# Patient Record
Sex: Female | Born: 1965 | Race: White | Hispanic: No | Smoking: Former smoker
Health system: Southern US, Community
[De-identification: ages and names within clinical notes are randomized; demographics above are authoritative.]

## PROBLEM LIST (undated history)

## (undated) DIAGNOSIS — M1712 Unilateral primary osteoarthritis, left knee: Secondary | ICD-10-CM

## (undated) DIAGNOSIS — I10 Essential (primary) hypertension: Secondary | ICD-10-CM

## (undated) DIAGNOSIS — N3281 Overactive bladder: Secondary | ICD-10-CM

## (undated) DIAGNOSIS — R300 Dysuria: Secondary | ICD-10-CM

## (undated) DIAGNOSIS — R7303 Prediabetes: Secondary | ICD-10-CM

## (undated) DIAGNOSIS — IMO0001 Reserved for inherently not codable concepts without codable children: Secondary | ICD-10-CM

## (undated) DIAGNOSIS — G709 Myoneural disorder, unspecified: Secondary | ICD-10-CM

## (undated) DIAGNOSIS — N393 Stress incontinence (female) (male): Secondary | ICD-10-CM

## (undated) DIAGNOSIS — E669 Obesity, unspecified: Secondary | ICD-10-CM

## (undated) DIAGNOSIS — Z8489 Family history of other specified conditions: Secondary | ICD-10-CM

## (undated) DIAGNOSIS — N3941 Urge incontinence: Secondary | ICD-10-CM

## (undated) DIAGNOSIS — M23204 Derangement of unspecified medial meniscus due to old tear or injury, left knee: Secondary | ICD-10-CM

## (undated) HISTORY — DX: Overactive bladder: N32.81

## (undated) HISTORY — DX: Stress incontinence (female) (male): N39.3

## (undated) HISTORY — DX: Reserved for inherently not codable concepts without codable children: IMO0001

## (undated) HISTORY — DX: Dysuria: R30.0

## (undated) HISTORY — DX: Urge incontinence: N39.41

## (undated) HISTORY — PX: ABDOMINAL HYSTERECTOMY: SHX81

## (undated) HISTORY — DX: Essential (primary) hypertension: I10

## (undated) HISTORY — DX: Obesity, unspecified: E66.9

---

## 2003-12-12 ENCOUNTER — Other Ambulatory Visit: Payer: Self-pay

## 2004-03-07 ENCOUNTER — Other Ambulatory Visit: Payer: Self-pay

## 2004-08-27 ENCOUNTER — Other Ambulatory Visit: Payer: Self-pay

## 2004-08-27 ENCOUNTER — Emergency Department: Payer: Self-pay | Admitting: Emergency Medicine

## 2004-09-03 ENCOUNTER — Emergency Department: Payer: Self-pay | Admitting: Emergency Medicine

## 2004-09-04 ENCOUNTER — Ambulatory Visit: Payer: Self-pay | Admitting: Emergency Medicine

## 2004-10-02 ENCOUNTER — Ambulatory Visit: Payer: Self-pay | Admitting: Internal Medicine

## 2004-11-20 ENCOUNTER — Ambulatory Visit: Payer: Self-pay | Admitting: Internal Medicine

## 2005-04-04 ENCOUNTER — Emergency Department: Payer: Self-pay | Admitting: Unknown Physician Specialty

## 2005-05-16 ENCOUNTER — Ambulatory Visit: Payer: Self-pay | Admitting: Specialist

## 2005-10-31 ENCOUNTER — Other Ambulatory Visit: Payer: Self-pay

## 2005-10-31 ENCOUNTER — Emergency Department: Payer: Self-pay | Admitting: Emergency Medicine

## 2005-12-24 ENCOUNTER — Ambulatory Visit: Payer: Self-pay | Admitting: Internal Medicine

## 2006-04-29 ENCOUNTER — Ambulatory Visit: Payer: Self-pay | Admitting: Internal Medicine

## 2007-01-11 ENCOUNTER — Emergency Department: Payer: Self-pay | Admitting: Emergency Medicine

## 2007-01-11 ENCOUNTER — Other Ambulatory Visit: Payer: Self-pay

## 2007-05-05 ENCOUNTER — Ambulatory Visit: Payer: Self-pay | Admitting: Internal Medicine

## 2008-04-29 ENCOUNTER — Ambulatory Visit: Payer: Self-pay | Admitting: Internal Medicine

## 2009-08-29 ENCOUNTER — Emergency Department: Payer: Self-pay | Admitting: Unknown Physician Specialty

## 2010-02-15 ENCOUNTER — Emergency Department: Payer: Self-pay | Admitting: Emergency Medicine

## 2010-07-26 ENCOUNTER — Emergency Department: Payer: Self-pay

## 2010-12-24 ENCOUNTER — Emergency Department: Payer: Self-pay | Admitting: Emergency Medicine

## 2011-07-27 ENCOUNTER — Emergency Department: Payer: Self-pay | Admitting: Emergency Medicine

## 2013-04-08 ENCOUNTER — Emergency Department: Payer: Self-pay | Admitting: Emergency Medicine

## 2013-04-08 LAB — URINALYSIS, COMPLETE
Bacteria: NONE SEEN
Glucose,UR: NEGATIVE mg/dL (ref 0–75)
Leukocyte Esterase: NEGATIVE
Nitrite: NEGATIVE
Squamous Epithelial: <1

## 2013-04-08 LAB — CBC
HGB: 13.9 g/dL (ref 12.0–16.0)
MCH: 30.5 pg (ref 26.0–34.0)
MCHC: 34.6 g/dL (ref 32.0–36.0)
MCV: 88 fL (ref 80–100)
Platelet: 198 10*3/uL (ref 150–440)
RBC: 4.55 10*6/uL (ref 3.80–5.20)
WBC: 8.1 10*3/uL (ref 3.6–11.0)

## 2013-04-08 LAB — BASIC METABOLIC PANEL
BUN: 16 mg/dL (ref 7–18)
Calcium, Total: 9.5 mg/dL (ref 8.5–10.1)
Chloride: 103 mmol/L (ref 98–107)
Co2: 26 mmol/L (ref 21–32)
Creatinine: 0.96 mg/dL (ref 0.60–1.30)
EGFR (African American): >60
Glucose: 124 mg/dL — ABNORMAL HIGH (ref 65–99)
Osmolality: 276 (ref 275–301)
Sodium: 137 mmol/L (ref 136–145)

## 2013-07-02 ENCOUNTER — Emergency Department: Payer: Self-pay | Admitting: Emergency Medicine

## 2013-11-26 ENCOUNTER — Emergency Department: Payer: Self-pay | Admitting: Emergency Medicine

## 2014-02-11 ENCOUNTER — Emergency Department: Payer: Self-pay | Admitting: Emergency Medicine

## 2014-11-22 ENCOUNTER — Ambulatory Visit: Payer: Self-pay | Admitting: Physical Therapy

## 2014-11-22 ENCOUNTER — Other Ambulatory Visit: Payer: Self-pay | Admitting: Family Medicine

## 2014-11-22 DIAGNOSIS — Z1231 Encounter for screening mammogram for malignant neoplasm of breast: Secondary | ICD-10-CM

## 2014-12-06 ENCOUNTER — Ambulatory Visit
Admission: RE | Admit: 2014-12-06 | Discharge: 2014-12-06 | Disposition: A | Discharge status: Home / Self Care | Payer: No Typology Code available for payment source | Source: Ambulatory Visit | Attending: Family Medicine | Admitting: Family Medicine

## 2014-12-06 DIAGNOSIS — Z1231 Encounter for screening mammogram for malignant neoplasm of breast: Secondary | ICD-10-CM | POA: Insufficient documentation

## 2014-12-08 ENCOUNTER — Ambulatory Visit: Payer: Self-pay | Admitting: Urology

## 2014-12-22 ENCOUNTER — Telehealth: Payer: Self-pay | Admitting: Urology

## 2014-12-22 NOTE — Telephone Encounter (Signed)
[place], the patient's fiance called to see if she can be seen before her appointment on June 30th. He said her symptoms are becoming much worse and she asked him to call and see if her appt can be moved up. According to [place], the pt was on a treadmill yesterday and urinated on herself without control. He said at any minute she could urinate herself. When she has to go to the bathroom, she has to go at that very moment and doesn't make it to the restroom in most instances. She would like to be seen asap so this can be resolved if at all possible. She's wondering if she can be worked in at some point. Please give them a phone call. Sandy[facility] ph# [phone] Thank you.

## 2014-12-22 NOTE — Telephone Encounter (Signed)
Per Larene Beach we can not work pt in before 01/05/15. Cw,lpn

## 2015-01-05 ENCOUNTER — Ambulatory Visit (INDEPENDENT_AMBULATORY_CARE_PROVIDER_SITE_OTHER): Payer: No Typology Code available for payment source | Admitting: Urology

## 2015-01-05 ENCOUNTER — Encounter: Payer: Self-pay | Admitting: Urology

## 2015-01-05 VITALS — BP 129/83 | HR 67 | Ht 66.0 in | Wt 246.1 lb

## 2015-01-05 DIAGNOSIS — R339 Retention of urine, unspecified: Secondary | ICD-10-CM | POA: Diagnosis not present

## 2015-01-05 MED ORDER — MIRABEGRON ER 50 MG PO TB24: 50.0000 mg | ORAL_TABLET | Freq: Every day | ORAL | Status: DC

## 2015-01-05 NOTE — Patient Instructions (Signed)
Perform clean intermittent catheterization twice daily We are better 50 mg once daily Follow-up in clinic in 2 months.

## 2015-01-05 NOTE — Progress Notes (Signed)
Reason for visit: Follow-up for urinary incontinence  49 year old female who has struggled with severe urinary incontinence and urinary frequency over the last 6-12 months. The patient states that she voids once an hour during the day and 6 or 7 times at night. She ultimately in sup going to the bathroom 30 times daily. She feels that she empties her bladder completely. She states that she has a strong stream. She has severe leakage with stress maneuvers including laughing, coughing, sneezing, and exercise. She also has urgency and urge incontinence. She does not have a history of recurrent urinary tract infections. She's not had any gross hematuria.  The patient underwent urodynamics approximately 6 weeks ago. This demonstrated a capacity of 400 mL. The patient had mild bladder prolapse. She did have stress incontinence as well as weak unstable bladder contractions. Most notably, the patient's postvoid residual was greater than 200 with each time she was prompted to empty her bladder.  In the interim the patient has been given Myrbetriq which she did not think was particularly helpful. Currently she is on oxybutynin 5 mg twice a day. She has not noticed any significant improvement with medications.  Filed Vitals:   01/05/15 1624  BP: 129/83  Pulse: 67   the patient is obese in no acute distress Her abdomen is soft, nontender, nondistended, no suprapubic tenderness Her vaginal exam demonstrates grade 2/4 anterior vaginal wall prolapse with urethral hypermobility. There was no demonstrable stress incontinence with Valsalva. There is no evidence of rectal prolapse.  PVR: 260 mL's Patient's urinalysis today demonstrates no evidence of microscopic hematuria or infection.  Impression: The patient has numerous issues, I think the most pressing 1 is her inability to empty her bladder completely. She has an elevated post void residual. She has a relatively small bladder capacity as determined by  urodynamics. She also has unstable bladder contractions albeit weak. Further, she has hyper mobility of her urethra and a mild bladder prolapse.  Recommendations: Based on the above, I have recommended that we initiate clean intermittent catheterization twice daily. This should help her significantly with her urinary frequency and urgency. He will also help with her incontinence. In addition, I have recommended that we start Mrbetriq 50 mg daily. I have provided 2 months of samples for the patient. We have also demonstrated clean intermittent catheterization technique. Both she and her husband were taught the technique, between the 2 of them they will try to get her empty twice daily. We will follow up in 2 months to see how the patient's symptoms have progressed.  Cc: Dr Sharyne Peach, MD

## 2015-01-11 ENCOUNTER — Telehealth: Payer: Self-pay | Admitting: Urology

## 2015-01-11 NOTE — Telephone Encounter (Signed)
Received a fax from [place] in the PT Department that states: "Patient was scheduled for a PT appointment on 5/17 and cancelled appointment.  Patient does not want to reschedule."

## 2015-01-17 ENCOUNTER — Encounter: Payer: Self-pay | Admitting: Emergency Medicine

## 2015-01-17 DIAGNOSIS — Z79899 Other long term (current) drug therapy: Secondary | ICD-10-CM | POA: Diagnosis not present

## 2015-01-17 DIAGNOSIS — N12 Tubulo-interstitial nephritis, not specified as acute or chronic: Secondary | ICD-10-CM | POA: Insufficient documentation

## 2015-01-17 DIAGNOSIS — R300 Dysuria: Secondary | ICD-10-CM | POA: Diagnosis present

## 2015-01-17 DIAGNOSIS — I10 Essential (primary) hypertension: Secondary | ICD-10-CM | POA: Insufficient documentation

## 2015-01-17 LAB — URINALYSIS COMPLETE WITH MICROSCOPIC (ARMC ONLY)
Bacteria, UA: NONE SEEN
Bilirubin Urine: NEGATIVE
Glucose, UA: NEGATIVE mg/dL
Ketones, ur: NEGATIVE mg/dL
NITRITE: NEGATIVE
Protein, ur: 30 mg/dL — AB
SPECIFIC GRAVITY, URINE: 1.012 (ref 1.005–1.030)
pH: 6 (ref 5.0–8.0)

## 2015-01-17 NOTE — ED Notes (Signed)
Pt presents to ER alert and in NAD. Pt reports lower back pain and pain with urination.

## 2015-01-18 ENCOUNTER — Telehealth: Payer: Self-pay

## 2015-01-18 ENCOUNTER — Emergency Department
Admission: EM | Admit: 2015-01-18 | Discharge: 2015-01-18 | Disposition: A | Discharge status: Home / Self Care | Payer: No Typology Code available for payment source | Attending: Emergency Medicine | Admitting: Emergency Medicine

## 2015-01-18 ENCOUNTER — Emergency Department: Payer: No Typology Code available for payment source

## 2015-01-18 DIAGNOSIS — R319 Hematuria, unspecified: Secondary | ICD-10-CM

## 2015-01-18 DIAGNOSIS — N12 Tubulo-interstitial nephritis, not specified as acute or chronic: Secondary | ICD-10-CM

## 2015-01-18 DIAGNOSIS — R109 Unspecified abdominal pain: Secondary | ICD-10-CM

## 2015-01-18 MED ORDER — SULFAMETHOXAZOLE-TRIMETHOPRIM 800-160 MG PO TABS
1.0000 | ORAL_TABLET | Freq: Once | ORAL | Status: AC
  Administered 2015-01-18: 1 via ORAL
  Filled 2015-01-18: qty 1

## 2015-01-18 MED ORDER — MORPHINE SULFATE 2 MG/ML IJ SOLN
2.0000 mg | Freq: Once | INTRAMUSCULAR | Status: AC
  Administered 2015-01-18: 2 mg via INTRAVENOUS
  Filled 2015-01-18: qty 1

## 2015-01-18 MED ORDER — SULFAMETHOXAZOLE-TRIMETHOPRIM 800-160 MG PO TABS: 1.0000 | ORAL_TABLET | Freq: Two times a day (BID) | ORAL | Status: AC

## 2015-01-18 MED ORDER — PHENAZOPYRIDINE HCL 200 MG PO TABS: 200.0000 mg | ORAL_TABLET | Freq: Three times a day (TID) | ORAL | Status: AC | PRN

## 2015-01-18 MED ORDER — ONDANSETRON HCL 4 MG/2ML IJ SOLN
4.0000 mg | Freq: Once | INTRAMUSCULAR | Status: AC
  Administered 2015-01-18: 4 mg via INTRAVENOUS
  Filled 2015-01-18: qty 2

## 2015-01-18 NOTE — ED Provider Notes (Signed)
[facility] Emergency Department Provider Note  ____________________________________________  Time seen: 2:15AM  I have reviewed the triage vital signs and the nursing notes.   HISTORY  Chief Complaint Back Pain and Dysuria      HPI Sherry Hernandez is a 49 y.o. female presents with left flank pain that is sharp in nature currently 8 out of 10 and nonradiating 3 days. Patient also admits to hematuria and dysuria. Of note patient self catheter twice per day. Patient denies any fever no nausea or vomiting     Past Medical History  Diagnosis Date  . Overactive bladder   . Dysuria   . Obesity   . HTN (hypertension)   . Stress incontinence   . Sensory urge incontinence   . Frequency     There are no active problems to display for this patient.   Past Surgical History  Procedure Laterality Date  . Abdominal hysterectomy      Current Outpatient Rx  Name  Route  Sig  Dispense  Refill  . losartan (COZAAR) 50 MG tablet   Oral   Take 50 mg by mouth daily.         . mirabegron ER (MYRBETRIQ) 50 MG TB24 tablet   Oral   Take 1 tablet (50 mg total) by mouth daily.   30 tablet        Allergies Review of patient's allergies indicates no known allergies.  Family History  Problem Relation Age of Onset  . Cancer Father   . Cancer Mother   . Breast cancer Mother 33  . Cancer Sister     Social History History  Substance Use Topics  . Smoking status: Never Smoker   . Smokeless tobacco: Not on file  . Alcohol Use: No    Review of Systems  Constitutional: Negative for fever. Eyes: Negative for visual changes. ENT: Negative for sore throat. Cardiovascular: Negative for chest pain. Respiratory: Negative for shortness of breath. Gastrointestinal: Negative for abdominal pain, vomiting and diarrhea. Genitourinary: Positive for hematuria and dysuria. Musculoskeletal: Positive for back pain. Skin: Negative for rash. Neurological:  Negative for headaches, focal weakness or numbness.   10-point ROS otherwise negative.  ____________________________________________   PHYSICAL EXAM:  VITAL SIGNS: ED Triage Vitals  Enc Vitals Group     BP 01/17/15 2152 136/35 mmHg     Pulse Rate 01/17/15 2152 85     Resp 01/17/15 2152 20     Temp 01/17/15 2152 98.3 F (36.8 C)     Temp Source 01/17/15 2152 Oral     SpO2 01/17/15 2152 93 %     Weight 01/17/15 2152 250 lb (113.399 kg)     Height 01/17/15 2152 5\' 6"  (1.676 m)     Head Cir --      Peak Flow --      Pain Score 01/17/15 2153 10     Pain Loc --      Pain Edu? --      Excl. in [place]? --      Constitutional: Alert and oriented. Well appearing and in no distress. Eyes: Conjunctivae are normal. PERRL. Normal extraocular movements. ENT   Head: Normocephalic and atraumatic.   Nose: No congestion/rhinnorhea.   Mouth/Throat: Mucous membranes are moist.   Neck: No stridor. Cardiovascular: Normal rate, regular rhythm. Normal and symmetric distal pulses are present in all extremities. No murmurs, rubs, or gallops. Respiratory: Normal respiratory effort without tachypnea nor retractions. Breath sounds are clear and equal bilaterally.  No wheezes/rales/rhonchi. Gastrointestinal: Soft and nontender. No distention. There is left CVA tenderness. Genitourinary: deferred Musculoskeletal: Nontender with normal range of motion in all extremities. No joint effusions.  No lower extremity tenderness nor edema. Neurologic:  Normal speech and language. No gross focal neurologic deficits are appreciated. Speech is normal.  Skin:  Skin is warm, dry and intact. No rash noted. Psychiatric: Mood and affect are normal. Speech and behavior are normal. Patient exhibits appropriate insight and judgment.  ____________________________________________    LABS (pertinent positives/negatives)  Labs Reviewed  URINALYSIS COMPLETEWITH MICROSCOPIC ([place]) - Abnormal; Notable for the  following:    Color, Urine YELLOW (*)    APPearance CLEAR (*)    Hgb urine dipstick 3+ (*)    Protein, ur 30 (*)    Leukocytes, UA TRACE (*)    Squamous Epithelial / LPF 0-5 (*)    All other components within normal limits       RADIOLOGY    ____________________________________________    INITIAL IMPRESSION / ASSESSMENT AND PLAN / ED COURSE  Pertinent labs & imaging results that were available during my care of the patient were reviewed by me and considered in my medical decision making (see chart for details).  Given urinalysis results and left CVA tenderness suspect pyelonephritis as etiology for patient's discomfort. As such patient was given Bactrim DS in the emergency department will be prescribed the same at home as well as Pyridium  ____________________________________________   FINAL CLINICAL IMPRESSION(S) / ED DIAGNOSES  Final diagnoses:  Left flank pain  Hematuria  Pyelonephritis      Gregor Hams, MD 01/18/15 [phone]

## 2015-01-18 NOTE — Telephone Encounter (Signed)
Patients husband called and wanted to let us know that she had to go to [facility] for a UTI today or yesterday and they said it was probably due to the caths they were using. Just wanted the office to know this.

## 2015-01-18 NOTE — ED Notes (Signed)
Patient transported to CT 

## 2015-01-18 NOTE — Discharge Instructions (Signed)
Pyelonephritis, Adult °Pyelonephritis is a kidney infection. In general, there are 2 main types of pyelonephritis: °· Infections that come on quickly without any warning (acute pyelonephritis). °· Infections that persist for a long period of time (chronic pyelonephritis). °CAUSES  °Two main causes of pyelonephritis are: °· Bacteria traveling from the bladder to the kidney. This is a problem especially in pregnant women. The urine in the bladder can become filled with bacteria from multiple causes, including: °¨ Inflammation of the prostate gland (prostatitis). °¨ Sexual intercourse in females. °¨ Bladder infection (cystitis). °· Bacteria traveling from the bloodstream to the tissue part of the kidney. °Problems that may increase your risk of getting a kidney infection include: °· Diabetes. °· Kidney stones or bladder stones. °· Cancer. °· Catheters placed in the bladder. °· Other abnormalities of the kidney or ureter. °SYMPTOMS  °· Abdominal pain. °· Pain in the side or flank area. °· Fever. °· Chills. °· Upset stomach. °· Blood in the urine (dark urine). °· Frequent urination. °· Strong or persistent urge to urinate. °· Burning or stinging when urinating. °DIAGNOSIS  °Your caregiver may diagnose your kidney infection based on your symptoms. A urine sample may also be taken. °TREATMENT  °In general, treatment depends on how severe the infection is.  °· If the infection is mild and caught early, your caregiver may treat you with oral antibiotics and send you home. °· If the infection is more severe, the bacteria may have gotten into the bloodstream. This will require intravenous (IV) antibiotics and a hospital stay. Symptoms may include: °¨ High fever. °¨ Severe flank pain. °¨ Shaking chills. °· Even after a hospital stay, your caregiver may require you to be on oral antibiotics for a period of time. °· Other treatments may be required depending upon the cause of the infection. °HOME CARE INSTRUCTIONS  °· Take your  antibiotics as directed. Finish them even if you start to feel better. °· Make an appointment to have your urine checked to make sure the infection is gone. °· Drink enough fluids to keep your urine clear or pale yellow. °· Take medicines for the bladder if you have urgency and frequency of urination as directed by your caregiver. °SEEK IMMEDIATE MEDICAL CARE IF:  °· You have a fever or persistent symptoms for more than 2-3 days. °· You have a fever and your symptoms suddenly get worse. °· You are unable to take your antibiotics or fluids. °· You develop shaking chills. °· You experience extreme weakness or fainting. °· There is no improvement after 2 days of treatment. °MAKE SURE YOU: °· Understand these instructions. °· Will watch your condition. °· Will get help right away if you are not doing well or get worse. °Document Released: 06/24/2005 Document Revised: 12/24/2011 Document Reviewed: 11/28/2010 °ExitCare® Patient Information ©2015 [employer]. This information is not intended to replace advice given to you by your health care provider. Make sure you discuss any questions you have with your health care provider. ° °

## 2015-01-18 NOTE — ED Notes (Signed)
Patient with no complaints at this time. Respirations even and unlabored. Skin warm/dry. Discharge instructions reviewed with patient at this time. Patient given opportunity to voice concerns/ask questions. IV removed per policy and band-aid applied to site. Patient discharged at this time and left Emergency Department, via wheelchair.   

## 2015-01-27 ENCOUNTER — Telehealth: Payer: Self-pay | Admitting: Urology

## 2015-01-27 NOTE — Telephone Encounter (Signed)
Sherry Hernandez, the pt's fiance called saying even though she has a catheter, her symptoms are still becoming worse. Per [name] still wets herself at least once a day but today she's done it three times already. He also stated inside of her vagina, it looks as if "something white has come forward." He couldn't be anymore descriptive than that. [name] has to urinate multiple times a day and the feeling of not being able to make it to the bathroom in time still persists. They'd like a phone call back regarding this please. Pt ph# [phone] Thank you.

## 2015-01-27 NOTE — Telephone Encounter (Signed)
Spoke with pt in reference to incontinence. Pt stated that she continues to urinate all over herself even with doing CIC. Pt states she went to the ER last week and was given bactrim ds. Pt states the burning is better since taking abt but the incontinence is the same. Pt stated she went for a walk yesterday and never felt the urge to urinate and urine just "ran out" of her. Please advise.

## 2015-01-30 ENCOUNTER — Other Ambulatory Visit: Payer: Self-pay

## 2015-01-30 DIAGNOSIS — R339 Retention of urine, unspecified: Secondary | ICD-10-CM

## 2015-01-30 MED ORDER — MIRABEGRON ER 50 MG PO TB24: 50.0000 mg | ORAL_TABLET | Freq: Every day | ORAL | Status: DC

## 2015-01-30 NOTE — Progress Notes (Signed)
Pt requested medication be sent to [facility] on [address]. Medication was sent to new pharmacy.

## 2015-01-30 NOTE — Telephone Encounter (Signed)
Spoke with pt in reference to medication. Pt voiced understanding. Pt was transferred to the front to make sooner f/u.

## 2015-01-30 NOTE — Telephone Encounter (Signed)
Please ensure that she is taking her Mybetriq 50 mg that was given.    She should reschedule her follow up sooner to discuss alternatives (including possible botox) since her current regimen is not working.    Hollice Espy, MD

## 2015-02-15 ENCOUNTER — Ambulatory Visit: Payer: No Typology Code available for payment source | Admitting: Urology

## 2015-03-23 ENCOUNTER — Encounter: Payer: Self-pay | Admitting: *Deleted

## 2015-03-24 ENCOUNTER — Encounter
Admission: RE | Disposition: A | Discharge status: Home / Self Care | Payer: Self-pay | Source: Ambulatory Visit | Attending: Gastroenterology

## 2015-03-24 ENCOUNTER — Ambulatory Visit
Admission: RE | Admit: 2015-03-24 | Discharge: 2015-03-24 | Disposition: A | Discharge status: Home / Self Care | Payer: No Typology Code available for payment source | Source: Ambulatory Visit | Attending: Gastroenterology | Admitting: Gastroenterology

## 2015-03-24 ENCOUNTER — Ambulatory Visit: Payer: No Typology Code available for payment source | Admitting: Anesthesiology

## 2015-03-24 DIAGNOSIS — Z87891 Personal history of nicotine dependence: Secondary | ICD-10-CM | POA: Insufficient documentation

## 2015-03-24 DIAGNOSIS — K644 Residual hemorrhoidal skin tags: Secondary | ICD-10-CM | POA: Diagnosis not present

## 2015-03-24 DIAGNOSIS — I10 Essential (primary) hypertension: Secondary | ICD-10-CM | POA: Insufficient documentation

## 2015-03-24 DIAGNOSIS — Z79899 Other long term (current) drug therapy: Secondary | ICD-10-CM | POA: Diagnosis not present

## 2015-03-24 DIAGNOSIS — Z1211 Encounter for screening for malignant neoplasm of colon: Secondary | ICD-10-CM | POA: Diagnosis present

## 2015-03-24 DIAGNOSIS — Z6841 Body Mass Index (BMI) 40.0 and over, adult: Secondary | ICD-10-CM | POA: Diagnosis not present

## 2015-03-24 HISTORY — PX: COLONOSCOPY WITH PROPOFOL: SHX5780

## 2015-03-24 SURGERY — COLONOSCOPY WITH PROPOFOL
Anesthesia: General

## 2015-03-24 MED ORDER — PROPOFOL 10 MG/ML IV BOLUS
INTRAVENOUS | Status: DC | PRN
  Administered 2015-03-24: 30 mg via INTRAVENOUS
  Administered 2015-03-24: 20 mg via INTRAVENOUS

## 2015-03-24 MED ORDER — SODIUM CHLORIDE 0.9 % IV SOLN
INTRAVENOUS | Status: DC
Start: 2015-03-24 — End: 2015-03-24

## 2015-03-24 MED ORDER — MIDAZOLAM HCL 2 MG/2ML IJ SOLN
INTRAMUSCULAR | Status: DC | PRN
  Administered 2015-03-24: 1 mg via INTRAVENOUS

## 2015-03-24 MED ORDER — SODIUM CHLORIDE 0.9 % IV SOLN
INTRAVENOUS | Status: DC
Start: 2015-03-24 — End: 2015-03-24
  Administered 2015-03-24: 1000 mL via INTRAVENOUS

## 2015-03-24 MED ORDER — FENTANYL CITRATE (PF) 100 MCG/2ML IJ SOLN
INTRAMUSCULAR | Status: DC | PRN
  Administered 2015-03-24: 1 ug via INTRAVENOUS

## 2015-03-24 MED ORDER — PROPOFOL INFUSION 10 MG/ML OPTIME
INTRAVENOUS | Status: DC | PRN
  Administered 2015-03-24: 120 ug/kg/min via INTRAVENOUS

## 2015-03-24 NOTE — Anesthesia Preprocedure Evaluation (Signed)
Anesthesia Evaluation  Patient identified by MRN, date of birth, ID band Patient awake    Reviewed: Allergy & Precautions, NPO status , Patient's Chart, lab work & pertinent test results  Airway Mallampati: II       Dental no notable dental hx.    Pulmonary former smoker,     + decreased breath sounds      Cardiovascular hypertension, Pt. on medications Normal cardiovascular exam     Neuro/Psych    GI/Hepatic negative GI ROS, Neg liver ROS,   Endo/Other  Morbid obesity  Renal/GU negative Renal ROS     Musculoskeletal   Abdominal (+) + obese,   Peds  Hematology   Anesthesia Other Findings   Reproductive/Obstetrics                             Anesthesia Physical Anesthesia Plan  ASA: III  Anesthesia Plan: General   Post-op Pain Management:    Induction: Intravenous  Airway Management Planned: Nasal Cannula  Additional Equipment:   Intra-op Plan:   Post-operative Plan:   Informed Consent: I have reviewed the patients History and Physical, chart, labs and discussed the procedure including the risks, benefits and alternatives for the proposed anesthesia with the patient or authorized representative who has indicated his/her understanding and acceptance.     Plan Discussed with:   Anesthesia Plan Comments:         Anesthesia Quick Evaluation

## 2015-03-24 NOTE — Op Note (Signed)
[facility] Patient Name: Sherry Hernandez Procedure Date: 03/24/2015 8:48 AM MRN: [id number] Account #: [id number] Date of Birth: March 17, 1966 Admit Type: Outpatient Age: 49 Room: [facility] ENDO ROOM 3 Gender: Female Note Status: Finalized Procedure:         Colonoscopy Indications:       Screening for colorectal malignant neoplasm, This is the                     patient's first colonoscopy Patient Profile:   This is a 49 year old female. Providers:         Gerrit Heck. Rayann Heman, MD Referring MD:      Rubbie Battiest Iona Beard, MD (Referring MD) Medicines:         Propofol per Anesthesia Complications:     No immediate complications. Procedure:         Pre-Anesthesia Assessment:                    - Prior to the procedure, a History and Physical was                     performed, and patient medications, allergies and                     sensitivities were reviewed. The patient's tolerance of                     previous anesthesia was reviewed.                    After obtaining informed consent, the colonoscope was                     passed under direct vision. Throughout the procedure, the                     patient's blood pressure, pulse, and oxygen saturations                     were monitored continuously. The Colonoscope was                     introduced through the anus and advanced to the the cecum,                     identified by appendiceal orifice and ileocecal valve. The                     colonoscopy was performed without difficulty. The patient                     tolerated the procedure well. The quality of the bowel                     preparation was good. Findings:      The perianal exam findings include non-thrombosed external hemorrhoids.      The exam was otherwise without abnormality on direct and retroflexion       views. Impression:        - Non-thrombosed external hemorrhoids found on perianal                     exam.     - The examination was otherwise normal on direct and  retroflexion views.                    - No specimens collected. Recommendation:    - Observe patient in GI recovery unit.                    - High fiber diet.                    - Continue present medications.                    - Repeat colonoscopy in 10 years for screening purposes.                    - Return to referring physician.                    - The findings and recommendations were discussed with the                     patient.                    - The findings and recommendations were discussed with the                     patient's family. Procedure Code(s): --- Professional ---                    [phone], Colonoscopy, flexible; diagnostic, including                     collection of specimen(s) by brushing or washing, when                     performed (separate procedure) CPT copyright 2014 American Medical Association. All rights reserved. The codes documented in this report are preliminary and upon coder review may  be revised to meet current compliance requirements. Mellody Life, MD 03/24/2015 9:24:08 AM This report has been signed electronically. Number of Addenda: 0 Note Initiated On: 03/24/2015 8:48 AM Scope Withdrawal Time: 0 hours 13 minutes 29 seconds  Total Procedure Duration: 0 hours 20 minutes 40 seconds       [facility]

## 2015-03-24 NOTE — Anesthesia Postprocedure Evaluation (Signed)
  Anesthesia Post-op Note  Patient: Sherry Hernandez  Procedure(s) Performed: Procedure(s): COLONOSCOPY WITH PROPOFOL (N/A)  Anesthesia type:General  Patient location: PACU  Post pain: Pain level controlled  Post assessment: Post-op Vital signs reviewed, Patient's Cardiovascular Status Stable, Respiratory Function Stable, Patent Airway and No signs of Nausea or vomiting  Post vital signs: Reviewed and stable  Last Vitals:  Filed Vitals:   03/24/15 0928  BP:   Pulse:   Temp: 36 C  Resp:     Level of consciousness: awake, alert  and patient cooperative  Complications: No apparent anesthesia complications

## 2015-03-24 NOTE — H&P (Signed)
  Primary Care Physician:  Sharyne Peach, MD  Pre-Procedure History & Physical: HPI:  Sherry Hernandez is a 49 y.o. female is here for an colonoscopy.   Past Medical History  Diagnosis Date  . Overactive bladder   . Dysuria   . Obesity   . HTN (hypertension)   . Stress incontinence   . Sensory urge incontinence   . Frequency     Past Surgical History  Procedure Laterality Date  . Abdominal hysterectomy      Prior to Admission medications   Medication Sig Start Date End Date Taking? Authorizing Provider  lisinopril-hydrochlorothiazide (PRINZIDE,ZESTORETIC) 10-12.5 MG per tablet Take 1 tablet by mouth daily.   Yes Historical Provider, MD  multivitamin-iron-minerals-folic acid (CENTRUM) chewable tablet Chew 1 tablet by mouth daily.   Yes Historical Provider, MD  losartan (COZAAR) 50 MG tablet Take 50 mg by mouth daily.    Historical Provider, MD  mirabegron ER (MYRBETRIQ) 50 MG TB24 tablet Take 1 tablet (50 mg total) by mouth daily. Patient not taking: Reported on 03/24/2015 01/05/15   Ardis Hughs, MD  mirabegron ER (MYRBETRIQ) 50 MG TB24 tablet Take 1 tablet (50 mg total) by mouth daily. 01/30/15   Nori Riis, PA-C  phenazopyridine (PYRIDIUM) 200 MG tablet Take 1 tablet (200 mg total) by mouth 3 (three) times daily as needed for pain. Patient not taking: Reported on 03/24/2015 01/18/15 01/18/16  Gregor Hams, MD    Allergies as of 02/22/2015  . (No Known Allergies)    Family History  Problem Relation Age of Onset  . Cancer Father   . Cancer Mother   . Breast cancer Mother 81  . Cancer Sister     Social History   Social History  . Marital Status: Married    Spouse Name: N/A  . Number of Children: N/A  . Years of Education: N/A   Occupational History  . Not on file.   Social History Main Topics  . Smoking status: Former Smoker -- 7 years    Quit date: 09/20/2007  . Smokeless tobacco: Not on file  . Alcohol Use: No  . Drug Use: No  . Sexual  Activity: Not on file   Other Topics Concern  . Not on file   Social History Narrative     Physical Exam: BP 154/78 mmHg  Pulse 70  Temp(Src) 96.6 F (35.9 C) (Tympanic)  Resp 20  Ht 5\' 6"  (1.676 m)  Wt 116.574 kg (257 lb)  BMI 41.50 kg/m2  SpO2 100% General:   Alert,  pleasant and cooperative in NAD Head:  Normocephalic and atraumatic. Neck:  Supple; no masses or thyromegaly. Lungs:  Clear throughout to auscultation.    Heart:  Regular rate and rhythm. Abdomen:  Soft, nontender and nondistended. Normal bowel sounds, without guarding, and without rebound.  +obese abd Neurologic:  Alert and  oriented x4;  grossly normal neurologically.  Impression/Plan: Sherry Hernandez is here for an colonoscopy to be performed for screening  Risks, benefits, limitations, and alternatives regarding  colonoscopy have been reviewed with the patient.  Questions have been answered.  All parties agreeable.   Josefine Class, MD  03/24/2015, 8:44 AM

## 2015-03-24 NOTE — Discharge Instructions (Signed)

## 2015-03-24 NOTE — Transfer of Care (Signed)
Immediate Anesthesia Transfer of Care Note  Patient: Sherry Hernandez  Procedure(s) Performed: Procedure(s): COLONOSCOPY WITH PROPOFOL (N/A)  Patient Location: PACU  Anesthesia Type:General  Level of Consciousness: sedated  Airway & Oxygen Therapy: Patient Spontanous Breathing  Post-op Assessment: Report given to RN  Post vital signs: stable  Last Vitals:  Filed Vitals:   03/24/15 0821  BP: 154/78  Pulse: 70  Temp: 35.9 C  Resp: 20    Complications: No apparent anesthesia complications

## 2015-03-25 ENCOUNTER — Encounter: Payer: Self-pay | Admitting: Gastroenterology

## 2015-07-21 ENCOUNTER — Encounter: Payer: Self-pay | Admitting: Emergency Medicine

## 2015-07-21 ENCOUNTER — Emergency Department
Admission: EM | Admit: 2015-07-21 | Discharge: 2015-07-21 | Disposition: A | Discharge status: Home / Self Care | Payer: No Typology Code available for payment source | Attending: Emergency Medicine | Admitting: Emergency Medicine

## 2015-07-21 DIAGNOSIS — I10 Essential (primary) hypertension: Secondary | ICD-10-CM | POA: Diagnosis not present

## 2015-07-21 DIAGNOSIS — Z87891 Personal history of nicotine dependence: Secondary | ICD-10-CM | POA: Diagnosis not present

## 2015-07-21 DIAGNOSIS — R112 Nausea with vomiting, unspecified: Secondary | ICD-10-CM

## 2015-07-21 DIAGNOSIS — Z79899 Other long term (current) drug therapy: Secondary | ICD-10-CM | POA: Diagnosis not present

## 2015-07-21 DIAGNOSIS — R109 Unspecified abdominal pain: Secondary | ICD-10-CM | POA: Insufficient documentation

## 2015-07-21 DIAGNOSIS — R252 Cramp and spasm: Secondary | ICD-10-CM | POA: Diagnosis not present

## 2015-07-21 DIAGNOSIS — A0811 Acute gastroenteropathy due to Norwalk agent: Secondary | ICD-10-CM

## 2015-07-21 LAB — CBC
HEMATOCRIT: 45.2 % (ref 35.0–47.0)
HEMOGLOBIN: 14.8 g/dL (ref 12.0–16.0)
MCH: 29.4 pg (ref 26.0–34.0)
MCHC: 32.8 g/dL (ref 32.0–36.0)
MCV: 89.6 fL (ref 80.0–100.0)
Platelets: 224 10*3/uL (ref 150–440)
RBC: 5.05 MIL/uL (ref 3.80–5.20)
RDW: 14.9 % — AB (ref 11.5–14.5)
WBC: 8.2 10*3/uL (ref 3.6–11.0)

## 2015-07-21 LAB — COMPREHENSIVE METABOLIC PANEL
ALBUMIN: 3.7 g/dL (ref 3.5–5.0)
ALT: 51 U/L (ref 14–54)
ANION GAP: 5 (ref 5–15)
AST: 37 U/L (ref 15–41)
Alkaline Phosphatase: 80 U/L (ref 38–126)
BILIRUBIN TOTAL: 0.6 mg/dL (ref 0.3–1.2)
BUN: 15 mg/dL (ref 6–20)
CO2: 31 mmol/L (ref 22–32)
Calcium: 9.5 mg/dL (ref 8.9–10.3)
Chloride: 104 mmol/L (ref 101–111)
Creatinine, Ser: 0.82 mg/dL (ref 0.44–1.00)
GFR calc non Af Amer: >60 mL/min (ref >60)
GLUCOSE: 103 mg/dL — AB (ref 65–99)
POTASSIUM: 4.2 mmol/L (ref 3.5–5.1)
Sodium: 140 mmol/L (ref 135–145)
TOTAL PROTEIN: 7.2 g/dL (ref 6.5–8.1)

## 2015-07-21 LAB — URINALYSIS COMPLETE WITH MICROSCOPIC (ARMC ONLY)
BILIRUBIN URINE: NEGATIVE
Glucose, UA: NEGATIVE mg/dL
HGB URINE DIPSTICK: NEGATIVE
Ketones, ur: NEGATIVE mg/dL
NITRITE: NEGATIVE
PH: 7 (ref 5.0–8.0)
PROTEIN: NEGATIVE mg/dL
RBC / HPF: NONE SEEN RBC/hpf (ref 0–5)
Specific Gravity, Urine: 1.005 (ref 1.005–1.030)

## 2015-07-21 LAB — LIPASE, BLOOD: Lipase: 38 U/L (ref 11–51)

## 2015-07-21 MED ORDER — ONDANSETRON 4 MG PO TBDP
4.0000 mg | ORAL_TABLET | Freq: Once | ORAL | Status: AC
  Administered 2015-07-21: 4 mg via ORAL
  Filled 2015-07-21: qty 1

## 2015-07-21 MED ORDER — ONDANSETRON HCL 4 MG PO TABS: 4.0000 mg | ORAL_TABLET | Freq: Four times a day (QID) | ORAL | Status: DC | PRN

## 2015-07-21 MED ORDER — DICYCLOMINE HCL 10 MG PO CAPS: 10.0000 mg | ORAL_CAPSULE | Freq: Three times a day (TID) | ORAL | Status: DC

## 2015-07-21 NOTE — ED Provider Notes (Signed)
[facility] Emergency Department Provider Note ____________________________________________  Time seen: 1815  I have reviewed the triage vital signs and the nursing notes.  HISTORY  Chief Complaint  Emesis  HPI Sherry Hernandez is a 50 y.o. female presents to the ED for evaluation of crampy abdominal pain and vomiting since yesterday. She describes similar symptoms in her husband. She has been unable to keep much of anything down. She denies fevers, chills, sweats, or diarrhea. She rates her abdominal cramping at 10/10 in triage.   Past Medical History  Diagnosis Date  . Overactive bladder   . Dysuria   . Obesity   . HTN (hypertension)   . Stress incontinence   . Sensory urge incontinence   . Frequency     There are no active problems to display for this patient.   Past Surgical History  Procedure Laterality Date  . Abdominal hysterectomy    . Colonoscopy with propofol N/A 03/24/2015    Procedure: COLONOSCOPY WITH PROPOFOL;  Surgeon: Josefine Class, MD;  Location: [facility] ENDOSCOPY;  Service: Endoscopy;  Laterality: N/A;    Current Outpatient Rx  Name  Route  Sig  Dispense  Refill  . dicyclomine (BENTYL) 10 MG capsule   Oral   Take 1 capsule (10 mg total) by mouth 3 (three) times daily before meals.   12 capsule   0   . lisinopril-hydrochlorothiazide (PRINZIDE,ZESTORETIC) 10-12.5 MG per tablet   Oral   Take 1 tablet by mouth daily.         Marland Kitchen losartan (COZAAR) 50 MG tablet   Oral   Take 50 mg by mouth daily.         . mirabegron ER (MYRBETRIQ) 50 MG TB24 tablet   Oral   Take 1 tablet (50 mg total) by mouth daily. Patient not taking: Reported on 03/24/2015   30 tablet      . mirabegron ER (MYRBETRIQ) 50 MG TB24 tablet   Oral   Take 1 tablet (50 mg total) by mouth daily.   30 tablet   0   . multivitamin-iron-minerals-folic acid (CENTRUM) chewable tablet   Oral   Chew 1 tablet by mouth daily.         . ondansetron (ZOFRAN)  4 MG tablet   Oral   Take 1 tablet (4 mg total) by mouth every 6 (six) hours as needed for nausea or vomiting.   15 tablet   0   . phenazopyridine (PYRIDIUM) 200 MG tablet   Oral   Take 1 tablet (200 mg total) by mouth 3 (three) times daily as needed for pain. Patient not taking: Reported on 03/24/2015   20 tablet   0    Allergies Review of patient's allergies indicates no known allergies.  Family History  Problem Relation Age of Onset  . Cancer Father   . Cancer Mother   . Breast cancer Mother 59  . Cancer Sister     Social History Social History  Substance Use Topics  . Smoking status: Former Smoker -- 7 years    Quit date: 09/20/2007  . Smokeless tobacco: None  . Alcohol Use: No   Review of Systems  Constitutional: Negative for fever. Eyes: Negative for visual changes. ENT: Negative for sore throat. Cardiovascular: Negative for chest pain. Respiratory: Negative for shortness of breath. Gastrointestinal: Negative for abdominal pain and vomiting. Denies diarrhea. Genitourinary: Negative for dysuria. Musculoskeletal: Negative for back pain. Skin: Negative for rash. Neurological: Negative for headaches, focal weakness or  numbness. ____________________________________________  PHYSICAL EXAM:  VITAL SIGNS: ED Triage Vitals  Enc Vitals Group     BP 07/21/15 1622 139/66 mmHg     Pulse Rate 07/21/15 1622 68     Resp 07/21/15 1622 18     Temp 07/21/15 1622 98 F (36.7 C)     Temp Source 07/21/15 1622 Oral     SpO2 07/21/15 1622 99 %     Weight 07/21/15 1622 229 lb (103.874 kg)     Height 07/21/15 1622 5\' 6"  (1.676 m)     Head Cir --      Peak Flow --      Pain Score 07/21/15 1637 10     Pain Loc --      Pain Edu? --      Excl. in [place]? --    Constitutional: Alert and oriented. Well appearing and in no distress. Head: Normocephalic and atraumatic.      Eyes: Conjunctivae are normal. PERRL. Normal extraocular movements      Ears: Canals clear. TMs intact  bilaterally.   Nose: No congestion/rhinorrhea.   Mouth/Throat: Mucous membranes are moist.   Neck: Supple. No thyromegaly. Hematological/Lymphatic/Immunological: No cervical lymphadenopathy. Cardiovascular: Normal rate, regular rhythm.  Respiratory: Normal respiratory effort. No wheezes/rales/rhonchi. Gastrointestinal: Soft and nontender. No distention, rebound, guarding, or organomegaly. Musculoskeletal: Nontender with normal range of motion in all extremities.  Neurologic:  Normal gait without ataxia. Normal speech and language. No gross focal neurologic deficits are appreciated. Skin:  Skin is warm, dry and intact. No rash noted. Psychiatric: Mood and affect are normal. Patient exhibits appropriate insight and judgment. ____________________________________________  PROCEDURES  Zofran 4 mg ODT ____________________________________________  INITIAL IMPRESSION / ASSESSMENT AND PLAN / ED COURSE  Patient with a likely viral gastroenteritis. She will discharged with prescriptions for Zofran and Bentyl to dose as directed. She will follow-up with Dr. Iona Beard for ongoing symptoms.  ____________________________________________  FINAL CLINICAL IMPRESSION(S) / ED DIAGNOSES  Final diagnoses:  Nausea and vomiting in adult patient  [place]-like agent      Melvenia Needles, PA-C 07/21/15 1949  Orbie Pyo, MD 07/22/15 0000

## 2015-07-21 NOTE — ED Notes (Signed)
Pt with vomiting started yesterday, unable to keep anything down. States husband has been sick with the same.

## 2015-07-21 NOTE — Discharge Instructions (Signed)
Nausea and Vomiting Nausea is a sick feeling that often comes before throwing up (vomiting). Vomiting is a reflex where stomach contents come out of your mouth. Vomiting can cause severe loss of body fluids (dehydration). Children and elderly adults can become dehydrated quickly, especially if they also have diarrhea. Nausea and vomiting are symptoms of a condition or disease. It is important to find the cause of your symptoms. CAUSES   Direct irritation of the stomach lining. This irritation can result from increased acid production (gastroesophageal reflux disease), infection, food poisoning, taking certain medicines (such as nonsteroidal anti-inflammatory drugs), alcohol use, or tobacco use.  Signals from the brain.These signals could be caused by a headache, heat exposure, an inner ear disturbance, increased pressure in the brain from injury, infection, a tumor, or a concussion, pain, emotional stimulus, or metabolic problems.  An obstruction in the gastrointestinal tract (bowel obstruction).  Illnesses such as diabetes, hepatitis, gallbladder problems, appendicitis, kidney problems, cancer, sepsis, atypical symptoms of a heart attack, or eating disorders.  Medical treatments such as chemotherapy and radiation.  Receiving medicine that makes you sleep (general anesthetic) during surgery. DIAGNOSIS Your caregiver may ask for tests to be done if the problems do not improve after a few days. Tests may also be done if symptoms are severe or if the reason for the nausea and vomiting is not clear. Tests may include:  Urine tests.  Blood tests.  Stool tests.  Cultures (to look for evidence of infection).  X-rays or other imaging studies. Test results can help your caregiver make decisions about treatment or the need for additional tests. TREATMENT You need to stay well hydrated. Drink frequently but in small amounts.You may wish to drink water, sports drinks, clear broth, or eat frozen  ice pops or gelatin dessert to help stay hydrated.When you eat, eating slowly may help prevent nausea.There are also some antinausea medicines that may help prevent nausea. HOME CARE INSTRUCTIONS   Take all medicine as directed by your caregiver.  If you do not have an appetite, do not force yourself to eat. However, you must continue to drink fluids.  If you have an appetite, eat a normal diet unless your caregiver tells you differently.  Eat a variety of complex carbohydrates (rice, wheat, potatoes, bread), lean meats, yogurt, fruits, and vegetables.  Avoid high-fat foods because they are more difficult to digest.  Drink enough water and fluids to keep your urine clear or pale yellow.  If you are dehydrated, ask your caregiver for specific rehydration instructions. Signs of dehydration may include:  Severe thirst.  Dry lips and mouth.  Dizziness.  Dark urine.  Decreasing urine frequency and amount.  Confusion.  Rapid breathing or pulse. SEEK IMMEDIATE MEDICAL CARE IF:   You have blood or brown flecks (like coffee grounds) in your vomit.  You have black or bloody stools.  You have a severe headache or stiff neck.  You are confused.  You have severe abdominal pain.  You have chest pain or trouble breathing.  You do not urinate at least once every 8 hours.  You develop cold or clammy skin.  You continue to vomit for longer than 24 to 48 hours.  You have a fever. MAKE SURE YOU:   Understand these instructions.  Will watch your condition.  Will get help right away if you are not doing well or get worse.   This information is not intended to replace advice given to you by your health care provider. Make sure  you discuss any questions you have with your health care provider.   Document Released: 06/24/2005 Document Revised: 09/16/2011 Document Reviewed: 11/21/2010 Elsevier Interactive Patient Education 2016 [employer].  Norovirus Infection A norovirus  infection is caused by exposure to a virus in a group of similar viruses (noroviruses). This type of infection causes inflammation in your stomach and intestines (gastroenteritis). Norovirus is the most common cause of gastroenteritis. It also causes food poisoning. Anyone can get a norovirus infection. It spreads very easily (contagious). You can get it from contaminated food, water, surfaces, or other people. Norovirus is found in the stool or vomit of infected people. You can spread the infection as soon as you feel sick until 2 weeks after you recover.  Symptoms usually begin within 2 days after you become infected. Most norovirus symptoms affect the digestive system. CAUSES Norovirus infection is caused by contact with norovirus. You can catch norovirus if you:  Eat or drink something contaminated with norovirus.  Touch surfaces or objects contaminated with norovirus and then put your hand in your mouth.  Have direct contact with an infected person who has symptoms.  Share food, drink, or utensils with someone with who is sick with norovirus. SIGNS AND SYMPTOMS Symptoms of norovirus may include:  Nausea.  Vomiting.  Diarrhea.  Stomach cramps.  Fever.  Chills.  Headache.  Muscle aches.  Tiredness. DIAGNOSIS Your health care provider may suspect norovirus based on your symptoms and physical exam. Your health care provider may also test a sample of your stool or vomit for the virus.  TREATMENT There is no specific treatment for norovirus. Most people get better without treatment in about 2 days. HOME CARE INSTRUCTIONS  Replace lost fluids by drinking plenty of water or rehydration fluids containing important minerals called electrolytes. This prevents dehydration. Drink enough fluid to keep your urine clear or pale yellow.  Do not prepare food for others while you are infected. Wait at least 3 days after recovering from the illness to do that. PREVENTION   Wash your  hands often, especially after using the toilet or changing a diaper.  Wash fruits and vegetables thoroughly before preparing or serving them.  Throw out any food that a sick person may have touched.  Disinfect contaminated surfaces immediately after someone in the household has been sick. Use a bleach-based household cleaner.  Immediately remove and wash soiled clothes or sheets. SEEK MEDICAL CARE IF:  Your vomiting, diarrhea, and stomach pain is getting worse.  Your symptoms of norovirus do not go away after 2-3 days. SEEK IMMEDIATE MEDICAL CARE IF:  You develop symptoms of dehydration that do not improve with fluid replacement. This may include:  Excessive sleepiness.  Lack of tears.  Dry mouth.  Dizziness when standing.  Weak pulse.   This information is not intended to replace advice given to you by your health care provider. Make sure you discuss any questions you have with your health care provider.   Document Released: 09/14/2002 Document Revised: 07/15/2014 Document Reviewed: 12/02/2013 Elsevier Interactive Patient Education [employer].   Your exam and labs were essentially normal today. Your symptoms are likely due to a viral stomach infection. Take the prescription meds as directed. Follow-up with Dr. Iona Beard for ongoing symptoms.

## 2015-07-22 ENCOUNTER — Observation Stay
Admission: EM | Admit: 2015-07-22 | Discharge: 2015-07-25 | Disposition: A | Discharge status: Home / Self Care | Payer: PRIVATE HEALTH INSURANCE | Attending: Internal Medicine | Admitting: Internal Medicine

## 2015-07-22 ENCOUNTER — Emergency Department: Payer: PRIVATE HEALTH INSURANCE

## 2015-07-22 DIAGNOSIS — N3281 Overactive bladder: Secondary | ICD-10-CM | POA: Insufficient documentation

## 2015-07-22 DIAGNOSIS — N3946 Mixed incontinence: Secondary | ICD-10-CM | POA: Insufficient documentation

## 2015-07-22 DIAGNOSIS — R1110 Vomiting, unspecified: Secondary | ICD-10-CM | POA: Insufficient documentation

## 2015-07-22 DIAGNOSIS — Z803 Family history of malignant neoplasm of breast: Secondary | ICD-10-CM | POA: Insufficient documentation

## 2015-07-22 DIAGNOSIS — I10 Essential (primary) hypertension: Secondary | ICD-10-CM | POA: Insufficient documentation

## 2015-07-22 DIAGNOSIS — Z809 Family history of malignant neoplasm, unspecified: Secondary | ICD-10-CM | POA: Diagnosis not present

## 2015-07-22 DIAGNOSIS — R112 Nausea with vomiting, unspecified: Secondary | ICD-10-CM | POA: Diagnosis not present

## 2015-07-22 DIAGNOSIS — E669 Obesity, unspecified: Secondary | ICD-10-CM | POA: Insufficient documentation

## 2015-07-22 DIAGNOSIS — Z79899 Other long term (current) drug therapy: Secondary | ICD-10-CM | POA: Diagnosis not present

## 2015-07-22 DIAGNOSIS — Z90710 Acquired absence of both cervix and uterus: Secondary | ICD-10-CM | POA: Insufficient documentation

## 2015-07-22 DIAGNOSIS — N83202 Unspecified ovarian cyst, left side: Secondary | ICD-10-CM | POA: Diagnosis not present

## 2015-07-22 DIAGNOSIS — R55 Syncope and collapse: Secondary | ICD-10-CM | POA: Insufficient documentation

## 2015-07-22 DIAGNOSIS — N839 Noninflammatory disorder of ovary, fallopian tube and broad ligament, unspecified: Secondary | ICD-10-CM | POA: Diagnosis present

## 2015-07-22 DIAGNOSIS — Z9049 Acquired absence of other specified parts of digestive tract: Secondary | ICD-10-CM | POA: Insufficient documentation

## 2015-07-22 DIAGNOSIS — J9811 Atelectasis: Secondary | ICD-10-CM | POA: Insufficient documentation

## 2015-07-22 DIAGNOSIS — R0789 Other chest pain: Secondary | ICD-10-CM | POA: Insufficient documentation

## 2015-07-22 DIAGNOSIS — R197 Diarrhea, unspecified: Secondary | ICD-10-CM | POA: Diagnosis not present

## 2015-07-22 DIAGNOSIS — Z87891 Personal history of nicotine dependence: Secondary | ICD-10-CM | POA: Diagnosis not present

## 2015-07-22 DIAGNOSIS — R350 Frequency of micturition: Secondary | ICD-10-CM | POA: Insufficient documentation

## 2015-07-22 DIAGNOSIS — Z6837 Body mass index (BMI) 37.0-37.9, adult: Secondary | ICD-10-CM | POA: Insufficient documentation

## 2015-07-22 DIAGNOSIS — R300 Dysuria: Secondary | ICD-10-CM | POA: Diagnosis not present

## 2015-07-22 DIAGNOSIS — R109 Unspecified abdominal pain: Secondary | ICD-10-CM | POA: Insufficient documentation

## 2015-07-22 LAB — LIPASE, BLOOD: Lipase: 34 U/L (ref 11–51)

## 2015-07-22 LAB — CBC WITH DIFFERENTIAL/PLATELET
BASOS ABS: 0 10*3/uL (ref 0–0.1)
Basophils Relative: 0 %
EOS ABS: 0.2 10*3/uL (ref 0–0.7)
EOS PCT: 2 %
HCT: 44.6 % (ref 35.0–47.0)
HEMOGLOBIN: 14.6 g/dL (ref 12.0–16.0)
LYMPHS ABS: 1.8 10*3/uL (ref 1.0–3.6)
LYMPHS PCT: 21 %
MCH: 29.6 pg (ref 26.0–34.0)
MCHC: 32.6 g/dL (ref 32.0–36.0)
MCV: 90.7 fL (ref 80.0–100.0)
Monocytes Absolute: 0.5 10*3/uL (ref 0.2–0.9)
Monocytes Relative: 6 %
NEUTROS PCT: 71 %
Neutro Abs: 6.1 10*3/uL (ref 1.4–6.5)
PLATELETS: 198 10*3/uL (ref 150–440)
RBC: 4.92 MIL/uL (ref 3.80–5.20)
RDW: 14.6 % — ABNORMAL HIGH (ref 11.5–14.5)
WBC: 8.6 10*3/uL (ref 3.6–11.0)

## 2015-07-22 LAB — URINALYSIS COMPLETE WITH MICROSCOPIC (ARMC ONLY)
BACTERIA UA: NONE SEEN
Bilirubin Urine: NEGATIVE
GLUCOSE, UA: NEGATIVE mg/dL
Hgb urine dipstick: NEGATIVE
Ketones, ur: NEGATIVE mg/dL
Leukocytes, UA: NEGATIVE
Nitrite: NEGATIVE
PROTEIN: NEGATIVE mg/dL
RBC / HPF: NONE SEEN RBC/hpf (ref 0–5)
Specific Gravity, Urine: 1.005 (ref 1.005–1.030)
pH: 7 (ref 5.0–8.0)

## 2015-07-22 LAB — COMPREHENSIVE METABOLIC PANEL
ALK PHOS: 75 U/L (ref 38–126)
ALT: 62 U/L — AB (ref 14–54)
AST: 38 U/L (ref 15–41)
Albumin: 3.6 g/dL (ref 3.5–5.0)
Anion gap: 4 — ABNORMAL LOW (ref 5–15)
BUN: 14 mg/dL (ref 6–20)
CALCIUM: 9.3 mg/dL (ref 8.9–10.3)
CHLORIDE: 103 mmol/L (ref 101–111)
CO2: 30 mmol/L (ref 22–32)
CREATININE: 0.93 mg/dL (ref 0.44–1.00)
GFR calc Af Amer: >60 mL/min (ref >60)
GFR calc non Af Amer: >60 mL/min (ref >60)
Glucose, Bld: 113 mg/dL — ABNORMAL HIGH (ref 65–99)
Potassium: 3.5 mmol/L (ref 3.5–5.1)
SODIUM: 137 mmol/L (ref 135–145)
Total Bilirubin: 0.8 mg/dL (ref 0.3–1.2)
Total Protein: 6.4 g/dL — ABNORMAL LOW (ref 6.5–8.1)

## 2015-07-22 MED ORDER — MIRABEGRON ER 50 MG PO TB24
50.0000 mg | ORAL_TABLET | Freq: Every day | ORAL | Status: DC
  Filled 2015-07-22: qty 1

## 2015-07-22 MED ORDER — ADULT MULTIVITAMIN W/MINERALS CH
1.0000 | ORAL_TABLET | Freq: Every day | ORAL | Status: DC
  Administered 2015-07-23 – 2015-07-25 (×3): 1 via ORAL
  Filled 2015-07-22 (×2): qty 1

## 2015-07-22 MED ORDER — CENTRUM PO CHEW
1.0000 | CHEWABLE_TABLET | Freq: Every day | ORAL | Status: DC
Start: 2015-07-23 — End: 2015-07-22

## 2015-07-22 MED ORDER — SODIUM CHLORIDE 0.9 % IV BOLUS (SEPSIS)
1000.0000 mL | Freq: Once | INTRAVENOUS | Status: AC
  Administered 2015-07-22: 1000 mL via INTRAVENOUS

## 2015-07-22 MED ORDER — ONDANSETRON HCL 4 MG/2ML IJ SOLN
4.0000 mg | Freq: Once | INTRAMUSCULAR | Status: AC
  Administered 2015-07-22: 4 mg via INTRAVENOUS
  Filled 2015-07-22: qty 2

## 2015-07-22 MED ORDER — LISINOPRIL 10 MG PO TABS
10.0000 mg | ORAL_TABLET | Freq: Every day | ORAL | Status: DC
  Administered 2015-07-23: 10 mg via ORAL
  Filled 2015-07-22: qty 1

## 2015-07-22 MED ORDER — LOSARTAN POTASSIUM 50 MG PO TABS: 50.0000 mg | ORAL_TABLET | Freq: Every day | ORAL | Status: DC

## 2015-07-22 MED ORDER — DICYCLOMINE HCL 10 MG PO CAPS
10.0000 mg | ORAL_CAPSULE | Freq: Three times a day (TID) | ORAL | Status: DC
  Administered 2015-07-23 (×2): 10 mg via ORAL
  Filled 2015-07-22 (×2): qty 1

## 2015-07-22 MED ORDER — PROMETHAZINE HCL 25 MG/ML IJ SOLN
12.5000 mg | Freq: Once | INTRAMUSCULAR | Status: AC
  Administered 2015-07-22: 12.5 mg via INTRAVENOUS
  Filled 2015-07-22: qty 1

## 2015-07-22 MED ORDER — OXYCODONE HCL 5 MG PO TABS
5.0000 mg | ORAL_TABLET | ORAL | Status: DC | PRN
  Administered 2015-07-23 – 2015-07-24 (×3): 5 mg via ORAL
  Filled 2015-07-22 (×3): qty 1

## 2015-07-22 MED ORDER — SODIUM CHLORIDE 0.9 % IV SOLN
INTRAVENOUS | Status: DC
Start: 2015-07-22 — End: 2015-07-25
  Administered 2015-07-22 – 2015-07-25 (×7): via INTRAVENOUS

## 2015-07-22 MED ORDER — LORAZEPAM 2 MG/ML IJ SOLN
2.0000 mg | Freq: Once | INTRAMUSCULAR | Status: AC
  Administered 2015-07-22: 2 mg via INTRAVENOUS
  Filled 2015-07-22: qty 1

## 2015-07-22 MED ORDER — ACETAMINOPHEN 325 MG PO TABS: 650.0000 mg | ORAL_TABLET | Freq: Four times a day (QID) | ORAL | Status: DC | PRN

## 2015-07-22 MED ORDER — HEPARIN SODIUM (PORCINE) 5000 UNIT/ML IJ SOLN
5000.0000 [IU] | Freq: Three times a day (TID) | INTRAMUSCULAR | Status: DC
  Administered 2015-07-22 – 2015-07-24 (×5): 5000 [IU] via SUBCUTANEOUS
  Filled 2015-07-22 (×5): qty 1

## 2015-07-22 MED ORDER — PROMETHAZINE HCL 25 MG/ML IJ SOLN
25.0000 mg | Freq: Once | INTRAMUSCULAR | Status: DC
  Filled 2015-07-22: qty 1

## 2015-07-22 MED ORDER — IOHEXOL 240 MG/ML SOLN
25.0000 mL | Freq: Once | INTRAMUSCULAR | Status: AC | PRN
  Administered 2015-07-22: 25 mL via ORAL

## 2015-07-22 MED ORDER — MORPHINE SULFATE (PF) 2 MG/ML IV SOLN
2.0000 mg | INTRAVENOUS | Status: DC | PRN
  Administered 2015-07-22 – 2015-07-25 (×7): 2 mg via INTRAVENOUS
  Filled 2015-07-22 (×7): qty 1

## 2015-07-22 MED ORDER — ONDANSETRON HCL 4 MG PO TABS: 4.0000 mg | ORAL_TABLET | Freq: Four times a day (QID) | ORAL | Status: DC | PRN

## 2015-07-22 MED ORDER — PROMETHAZINE HCL 25 MG/ML IJ SOLN
12.5000 mg | INTRAMUSCULAR | Status: DC | PRN
  Administered 2015-07-22 – 2015-07-25 (×3): 12.5 mg via INTRAVENOUS
  Filled 2015-07-22 (×2): qty 1

## 2015-07-22 MED ORDER — IOHEXOL 300 MG/ML  SOLN
125.0000 mL | Freq: Once | INTRAMUSCULAR | Status: AC | PRN
  Administered 2015-07-22: 125 mL via INTRAVENOUS

## 2015-07-22 MED ORDER — SODIUM CHLORIDE 0.9 % IV BOLUS (SEPSIS)
1000.0000 mL | Freq: Once | INTRAVENOUS | Status: AC
Start: 2015-07-22 — End: 2015-07-22
  Administered 2015-07-22: 1000 mL via INTRAVENOUS

## 2015-07-22 MED ORDER — ONDANSETRON HCL 4 MG/2ML IJ SOLN
4.0000 mg | Freq: Four times a day (QID) | INTRAMUSCULAR | Status: DC | PRN
  Administered 2015-07-23 – 2015-07-25 (×3): 4 mg via INTRAVENOUS
  Filled 2015-07-22 (×3): qty 2

## 2015-07-22 MED ORDER — MORPHINE SULFATE (PF) 4 MG/ML IV SOLN
4.0000 mg | Freq: Once | INTRAVENOUS | Status: AC
  Administered 2015-07-22: 4 mg via INTRAVENOUS
  Filled 2015-07-22: qty 1

## 2015-07-22 MED ORDER — ACETAMINOPHEN 650 MG RE SUPP: 650.0000 mg | Freq: Four times a day (QID) | RECTAL | Status: DC | PRN

## 2015-07-22 NOTE — ED Notes (Signed)
Pt arrived via EMS c/o abdominal pain. Pt reports being seen yesterday and dx with norovirus. Today pt has been N/V/D and reports losing consciousness from frequently vomiting.

## 2015-07-22 NOTE — H&P (Signed)
[place] [facility] [place] NAME: Sherry Hernandez    MR#:  [id number]  DATE OF BIRTH:  [birth year]   DATE OF ADMISSION:  07/22/2015  PRIMARY CARE PHYSICIAN: Sharyne Peach, MD   REQUESTING/REFERRING PHYSICIAN: Quale  CHIEF COMPLAINT:   Nausea vomiting  HISTORY OF PRESENT ILLNESS:  Sherry Hernandez  is a 50 y.o. female with a known history of essential hypertension who is presenting with nausea and vomiting. She describes 2-3 day duration of nausea vomiting nonbloody nonbilious emesis which is now turned to dry heaves. She has associated abdominal pain described as cramping in quality 8/10 in intensity nonradiating and no worsening or relieving factors. She has been by mouth intolerant for the same time. She was actually evaluated at [facility] just one day prior and discharged however she continues to be symptomatic. In the emergency department she has received greater than 3 doses of antiemetics however still actively dry heaving.  PAST MEDICAL HISTORY:   Past Medical History  Diagnosis Date  . Overactive bladder   . Dysuria   . Obesity   . HTN (hypertension)   . Stress incontinence   . Sensory urge incontinence   . Frequency     PAST SURGICAL HISTORY:   Past Surgical History  Procedure Laterality Date  . Abdominal hysterectomy    . Colonoscopy with propofol N/A 03/24/2015    Procedure: COLONOSCOPY WITH PROPOFOL;  Surgeon: Josefine Class, MD;  Location: [facility] ENDOSCOPY;  Service: Endoscopy;  Laterality: N/A;    SOCIAL HISTORY:   Social History  Substance Use Topics  . Smoking status: Former Smoker -- 7 years    Quit date: 09/20/2007  . Smokeless tobacco: Not on file  . Alcohol Use: No    FAMILY HISTORY:   Family History  Problem Relation Age of Onset  . Cancer Father   . Cancer Mother   . Breast cancer Mother 65  . Cancer Sister     DRUG ALLERGIES:  No Known Allergies  REVIEW OF SYSTEMS:   REVIEW OF SYSTEMS:  CONSTITUTIONAL: Denies fevers, chills, fatigue, weakness.  EYES: Denies blurred vision, double vision, or eye pain.  EARS, NOSE, THROAT: Denies tinnitus, ear pain, hearing loss.  RESPIRATORY: denies cough, shortness of breath, wheezing  CARDIOVASCULAR: Denies chest pain, palpitations, edema.  GASTROINTESTINAL:Positivea, vomiting, , abdominal pain.  denies diarrhea/constipation  GENITOURINARY: Denies dysuria, hematuria.  ENDOCRINE: Denies nocturia or thyroid problems. HEMATOLOGIC AND LYMPHATIC: Denies easy bruising or bleeding.  SKIN: Denies rash or lesions.  MUSCULOSKELETAL: Denies pain in neck, back, shoulder, knees, hips, or further arthritic symptoms.  NEUROLOGIC: Denies paralysis, paresthesias.  PSYCHIATRIC: Denies anxiety or depressive symptoms. Otherwise full review of systems performed by me is negative.   MEDICATIONS AT HOME:   Prior to Admission medications   Medication Sig Start Date End Date Taking? Authorizing Provider  dicyclomine (BENTYL) 10 MG capsule Take 1 capsule (10 mg total) by mouth 3 (three) times daily before meals. 07/21/15 08/04/15  Jenise V Bacon Menshew, PA-C  lisinopril-hydrochlorothiazide (PRINZIDE,ZESTORETIC) 10-12.5 MG per tablet Take 1 tablet by mouth daily.    Historical Provider, MD  losartan (COZAAR) 50 MG tablet Take 50 mg by mouth daily.    Historical Provider, MD  mirabegron ER (MYRBETRIQ) 50 MG TB24 tablet Take 1 tablet (50 mg total) by mouth daily. Patient not taking: Reported on 03/24/2015 01/05/15   Ardis Hughs, MD  mirabegron ER (MYRBETRIQ) 50 MG TB24 tablet Take 1  tablet (50 mg total) by mouth daily. 01/30/15   Nori Riis, PA-C  multivitamin-iron-minerals-folic acid (CENTRUM) chewable tablet Chew 1 tablet by mouth daily.    Historical Provider, MD  ondansetron (ZOFRAN) 4 MG tablet Take 1 tablet (4 mg total) by mouth every 6 (six) hours as needed for nausea or vomiting. 07/21/15   Jenise V Bacon Menshew, PA-C   phenazopyridine (PYRIDIUM) 200 MG tablet Take 1 tablet (200 mg total) by mouth 3 (three) times daily as needed for pain. Patient not taking: Reported on 03/24/2015 01/18/15 01/18/16  Gregor Hams, MD      VITAL SIGNS:  Blood pressure 111/63, pulse 64, temperature 97.8 F (36.6 C), temperature source Oral, resp. rate 23, height 5\' 6"  (1.676 m), weight 229 lb (103.874 kg), SpO2 97 %.  PHYSICAL EXAMINATION:  VITAL SIGNS: Filed Vitals:   07/22/15 1900 07/22/15 1930  BP: 92/53 111/63  Pulse: 68 64  Temp:    Resp: 10 23   GENERAL:49 y.o.female currently ill-appearing with active vomiting HEAD: Normocephalic, atraumatic.  EYES: Pupils equal, round, reactive to light. Extraocular muscles intact. No scleral icterus.  MOUTH: Moist mucosal membrane. Dentition intact. No abscess noted.  EAR, NOSE, THROAT: Clear without exudates. No external lesions.  NECK: Supple. No thyromegaly. No nodules. No JVD.  PULMONARY: Clear to ascultation, without wheeze rails or rhonci. No use of accessory muscles, Good respiratory effort. good air entry bilaterally CHEST: Nontender to palpation.  CARDIOVASCULAR: S1 and S2. Regular rate and rhythm. No murmurs, rubs, or gallops. No edema. Pedal pulses 2+ bilaterally.  GASTROINTESTINAL: Soft, nontender, nondistended. No masses. Positive bowel sounds. No hepatosplenomegaly.  MUSCULOSKELETAL: No swelling, clubbing, or edema. Range of motion full in all extremities.  NEUROLOGIC: Cranial nerves II through XII are intact. No gross focal neurological deficits. Sensation intact. Reflexes intact.  SKIN: No ulceration, lesions, rashes, or cyanosis. Skin warm and dry. Turgor intact.  PSYCHIATRIC: Mood, affect within normal limits. The patient is awake, alert and oriented x 3. Insight, judgment intact.    LABORATORY PANEL:   CBC  Recent Labs Lab 07/22/15 1819  WBC 8.6  HGB 14.6  HCT 44.6  PLT 198    ------------------------------------------------------------------------------------------------------------------  Chemistries   Recent Labs Lab 07/22/15 1819  NA 137  K 3.5  CL 103  CO2 30  GLUCOSE 113*  BUN 14  CREATININE 0.93  CALCIUM 9.3  AST 38  ALT 62*  ALKPHOS 75  BILITOT 0.8   ------------------------------------------------------------------------------------------------------------------  Cardiac Enzymes No results for input(s): TROPONINI in the last 168 hours. ------------------------------------------------------------------------------------------------------------------  RADIOLOGY:  Ct Abdomen Pelvis W Contrast  07/22/2015  CLINICAL DATA:  Patient c/o abdominal pain. Pt reports being seen yesterday at ED and was diagnosed with norovirus. Today pt has been N/V/D and reports losing consciousness from frequently vomiting. Hx of cholecystectomy and hysterectomy. EXAM: CT ABDOMEN AND PELVIS WITH CONTRAST TECHNIQUE: Multidetector CT imaging of the abdomen and pelvis was performed using the standard protocol following bolus administration of intravenous contrast. CONTRAST:  111mL OMNIPAQUE IOHEXOL 300 MG/ML  SOLN COMPARISON:  CT 01/18/2015 FINDINGS: Lower chest: Bibasilar atelectasis. Hepatobiliary: Postcholecystectomy. No biliary dilatation. Low-attenuation along the falciform ligament likely represents fatty infiltration (image 23, series 2). Small 4 mm lesion in the RIGHT hepatic lobe likely represents a benign cyst. Pancreas: Pancreas is normal. No ductal dilatation. No pancreatic inflammation. Spleen: Normal spleen Adrenals/urinary tract: Adrenal glands and kidneys are normal. The ureters and bladder normal. Stomach/Bowel: Stomach, small bowel, appendix, and cecum are normal. The colon and  rectosigmoid colon are normal. Vascular/Lymphatic: Abdominal aorta is normal caliber. There is no retroperitoneal or periportal lymphadenopathy. No pelvic lymphadenopathy. Reproductive:  Post hysterectomy. There is a round low-attenuation lesion of the LEFT ovary with simple fluid attenuation measuring 4.3 cm. RIGHT ovary is small at 1.8 x 3.7 cm (image 30 of 76, series 2). Other: Small amount free fluid the posterior cul-de-sac on image 81, series 2 Musculoskeletal: No acute osseous abnormality. IMPRESSION: 1. No acute findings abdomen pelvis. 2. Normal appendix. 3. Trace free fluid the pelvis. 4. Rounded low-density lesion of the LEFT ovary has benign characteristics and likely represents a benign ovarian cyst. If patient is premenopausal no follow-up required. If patient is postmenopausal recommend follow-up pelvic ultrasound in 6-12 weeks. This recommendation follows ACR consensus guidelines: White Paper of the ACR Incidental Findings Committee II on Adnexal Findings. J Am Coll Radiol [id number]. Electronically Signed   By: Suzy Bouchard M.D.   On: 07/22/2015 20:25    EKG:   Orders placed or performed during the hospital encounter of 07/22/15  . EKG 12-Lead  . EKG 12-Lead    IMPRESSION AND PLAN:   50 year old Caucasian female history of essential hypertension presenting with nausea and vomiting.  1. Intractable nausea and vomiting, unspecified: Continue supportive measures, IV fluid hydration, will hold hydrochlorothiazide 2. Essential hypertension: Continue home medications, hold diuretic 3. Overactive bladder: myrbetriq 4. Venous thromboembolism prophylactic: Heparin subcutaneous     All the records are reviewed and case discussed with ED provider. Management plans discussed with the patient, family and they are in agreement.  CODE STATUS:  full TIME TAKING CARE OF THIS PATIENT 35 minutes.    Hower,  Karenann Cai.D on 07/22/2015 at 9:11 PM  Between 7am to 6pm - Pager - [phone]  After 6pm: House Pager: - [phone]  [facility]  Office  [phone]  CC: Primary care physician; Sharyne Peach, MD

## 2015-07-22 NOTE — ED Provider Notes (Signed)
[facility] Emergency Department Provider Note  ____________________________________________  Time seen: Approximately 5:45 PM  I have reviewed the triage vital signs and the nursing notes.   HISTORY  Chief Complaint Abdominal Pain    HPI Sherry Hernandez is a 50 y.o. female history of obesity, hypertension.  Patient reports that since Thursday she has had nausea vomiting and loose stool. She started many many times, difficulty keeping any water or food down. In addition she reports loose and watery stools about 4 times daily since Thursday. She is currently taking Zofran was seen in the ER yesterday. She reports her symptoms have not improved with Zofran at home and she feels "dehydrated". Today shortly before arrival she reports that her nausea worsened, she was continuing to throw up and during one of the episode she passed out. She denies any injury, but is unsure how long she passed out for. She presently reports she is having pain over the upper and left side of the stomach. Chest pain or trouble breathing. She does not have any fevers but has felt some chills. She believes she has "norovirus".  Previous cholecystectomy and hysterectomy.     no blood in the stool. No black or dark emesis   Past Medical History  Diagnosis Date  . Overactive bladder   . Dysuria   . Obesity   . HTN (hypertension)   . Stress incontinence   . Sensory urge incontinence   . Frequency     There are no active problems to display for this patient.   Past Surgical History  Procedure Laterality Date  . Abdominal hysterectomy    . Colonoscopy with propofol N/A 03/24/2015    Procedure: COLONOSCOPY WITH PROPOFOL;  Surgeon: Josefine Class, MD;  Location: [facility] ENDOSCOPY;  Service: Endoscopy;  Laterality: N/A;    Current Outpatient Rx  Name  Route  Sig  Dispense  Refill  . dicyclomine (BENTYL) 10 MG capsule   Oral   Take 1 capsule (10 mg total) by mouth 3 (three)  times daily before meals.   12 capsule   0   . lisinopril-hydrochlorothiazide (PRINZIDE,ZESTORETIC) 10-12.5 MG per tablet   Oral   Take 1 tablet by mouth daily.         Marland Kitchen losartan (COZAAR) 50 MG tablet   Oral   Take 50 mg by mouth daily.         . mirabegron ER (MYRBETRIQ) 50 MG TB24 tablet   Oral   Take 1 tablet (50 mg total) by mouth daily. Patient not taking: Reported on 03/24/2015   30 tablet      . mirabegron ER (MYRBETRIQ) 50 MG TB24 tablet   Oral   Take 1 tablet (50 mg total) by mouth daily.   30 tablet   0   . multivitamin-iron-minerals-folic acid (CENTRUM) chewable tablet   Oral   Chew 1 tablet by mouth daily.         . ondansetron (ZOFRAN) 4 MG tablet   Oral   Take 1 tablet (4 mg total) by mouth every 6 (six) hours as needed for nausea or vomiting.   15 tablet   0   . phenazopyridine (PYRIDIUM) 200 MG tablet   Oral   Take 1 tablet (200 mg total) by mouth 3 (three) times daily as needed for pain. Patient not taking: Reported on 03/24/2015   20 tablet   0     Allergies Review of patient's allergies indicates no known allergies.  Family  History  Problem Relation Age of Onset  . Cancer Father   . Cancer Mother   . Breast cancer Mother 9  . Cancer Sister     Social History Social History  Substance Use Topics  . Smoking status: Former Smoker -- 7 years    Quit date: 09/20/2007  . Smokeless tobacco: None  . Alcohol Use: No    Review of Systems Constitutional: No fever/chills Eyes: No visual changes. ENT: No sore throat. Cardiovascular: Denies chest pain. Respiratory: Denies shortness of breath. Gastrointestinal:  No constipation. Genitourinary: Negative for dysuria. Musculoskeletal: Negative for back pain. Skin: Negative for rash. Neurological: Negative for headaches, focal weakness or numbness.She feels very fatigued and tired. Feels lightheaded when she tries to walk.  10-point ROS otherwise  negative.  ____________________________________________   PHYSICAL EXAM:  VITAL SIGNS: ED Triage Vitals  Enc Vitals Group     BP --      Pulse --      Resp --      Temp --      Temp src --      SpO2 --      Weight --      Height --      Head Cir --      Peak Flow --      Pain Score --      Pain Loc --      Pain Edu? --      Excl. in [place]? --    Constitutional: Alert and oriented.  moderately ill and fatigued appearing. In no acute distress. Eyes: Conjunctivae are normal. PERRL. EOMI. Head: Atraumatic. Nose: No congestion/rhinnorhea. Mouth/Throat: Mucous membranes are moist.  Oropharynx non-erythematous. Neck: No stridor.   Cardiovascular: Normal rate, regular rhythm. Grossly normal heart sounds.  Good peripheral circulation. Respiratory: Normal respiratory effort.  No retractions. Lungs CTAB. Gastrointestinal: Soft and nontender on the right side, but has mild epigastric tenderness and moderate tenderness without rebound or guarding primarily over the left lower quadrant. No distention. No CVA tenderness. Musculoskeletal: No lower extremity tenderness nor edema.  No joint effusions. Neurologic:  Normal speech and language. No gross focal neurologic deficits are appreciatnormal cranial nerves.Skin:  Skin is warm, dry and intact. No rash noted. Psychiatric: Mood and affect are normal. Speech and behavior are normal.  ____________________________________________   LABS (all labs ordered are listed, but only abnormal results are displayed)  Labs Reviewed  CBC WITH DIFFERENTIAL/PLATELET - Abnormal; Notable for the following:    RDW 14.6 (*)    All other components within normal limits  COMPREHENSIVE METABOLIC PANEL - Abnormal; Notable for the following:    Glucose, Bld 113 (*)    Total Protein 6.4 (*)    ALT 62 (*)    Anion gap 4 (*)    All other components within normal limits  URINALYSIS COMPLETEWITH MICROSCOPIC ([facility] ONLY) - Abnormal; Notable for the following:     Color, Urine STRAW (*)    APPearance CLEAR (*)    Squamous Epithelial / LPF 0-5 (*)    All other components within normal limits  GASTROINTESTINAL PANEL BY PCR, STOOL (REPLACES STOOL CULTURE)  LIPASE, BLOOD   ____________________________________________  EKG  ED ECG REPORT I, Dedric Ethington, the attending physician, personally viewed and interpreted this ECG.  Date: 07/22/2015 EKG Time: 1740  Rate: 75 Rhythm: normal sinus rhythm QRS Axis: normal Intervals: normal ST/T Wave abnormalities: normal Conduction Disutrbances: none. Normal QTc Narrative Interpretation: unremarkable  ____________________________________________  RADIOLOGY  CT Abdomen Pelvis  W Contrast (Final result) Result time: 07/22/15 20:25:46   Final result by Rad Results In Interface (07/22/15 20:25:46)   Narrative:   CLINICAL DATA: Patient c/o abdominal pain. Pt reports being seen yesterday at ED and was diagnosed with norovirus. Today pt has been N/V/D and reports losing consciousness from frequently vomiting. Hx of cholecystectomy and hysterectomy.  EXAM: CT ABDOMEN AND PELVIS WITH CONTRAST  TECHNIQUE: Multidetector CT imaging of the abdomen and pelvis was performed using the standard protocol following bolus administration of intravenous contrast.  CONTRAST: 11mL OMNIPAQUE IOHEXOL 300 MG/ML SOLN  COMPARISON: CT 01/18/2015  FINDINGS: Lower chest: Bibasilar atelectasis.  Hepatobiliary: Postcholecystectomy. No biliary dilatation. Low-attenuation along the falciform ligament likely represents fatty infiltration (image 23, series 2). Small 4 mm lesion in the RIGHT hepatic lobe likely represents a benign cyst.  Pancreas: Pancreas is normal. No ductal dilatation. No pancreatic inflammation.  Spleen: Normal spleen  Adrenals/urinary tract: Adrenal glands and kidneys are normal. The ureters and bladder normal.  Stomach/Bowel: Stomach, small bowel, appendix, and cecum are normal. The colon  and rectosigmoid colon are normal.  Vascular/Lymphatic: Abdominal aorta is normal caliber. There is no retroperitoneal or periportal lymphadenopathy. No pelvic lymphadenopathy.  Reproductive: Post hysterectomy. There is a round low-attenuation lesion of the LEFT ovary with simple fluid attenuation measuring 4.3 cm. RIGHT ovary is small at 1.8 x 3.7 cm (image 30 of 76, series 2).  Other: Small amount free fluid the posterior cul-de-sac on image 81, series 2  Musculoskeletal: No acute osseous abnormality.  IMPRESSION: 1. No acute findings abdomen pelvis. 2. Normal appendix. 3. Trace free fluid the pelvis. 4. Rounded low-density lesion of the LEFT ovary has benign characteristics and likely represents a benign ovarian cyst. If patient is premenopausal no follow-up required. If patient is postmenopausal recommend follow-up pelvic ultrasound in 6-12 weeks. This recommendation follows ACR consensus guidelines: White Paper of the ACR Incidental Findings Committee II on Adnexal Findings. J Am Coll Radiol [phone].   Electronically Signed By: Suzy Bouchard M.D. On: 07/22/2015 20:25    ____________________________________________   PROCEDURES  Procedure(s) performed: None  Critical Care performed: No  ____________________________________________   INITIAL IMPRESSION / ASSESSMENT AND PLAN / ED COURSE  Pertinent labs & imaging results that were available during my care of the patient were reviewed by me and considered in my medical decision making (see chart for details).  Syncope. Likely secondary to dehydration and associated nausea and vomiting previously diagnosis gastroenteritis. She reports intractable nausea and vomiting despite Zofran use at home. In addition, she has a focal tenderness the left lower quadrant with loose stools and vomiting would raise a suspicion for possible diverticulitis. We will obtain CT imaging to further evaluate. Her EKG and return  to normal mental status is very reassuring after syncope, which reveals likely prerenal and dehydration in nature. We will hydrate generously, provide antiemetics and pain control, reevaluate labs and obtain CT of the abdomen and pelvis.   ----------------------------------------- 8:54 PM on 07/22/2015 -----------------------------------------  Patient was feeling improved, however attempting by mouth challenge the patient became suddenly severely nauseated again and is now vomiting. We'll give additional Phenergan and Zofran at this time. QTC was checked previous and was normal. CT does not demonstrate acute intra-abdominal finding though there is some slight trace free fluid and a low-density left ovarian lesion.  This point the patient and he is at nausea and vomiting despite antibiotics, hydration and pain control. Admit to the hospitalist service for ongoing management and further workup. Left ovarian  lesion was also discussed with Dr. Lavetta Nielsen.   ____________________________________________   FINAL CLINICAL IMPRESSION(S) / ED DIAGNOSES  Final diagnoses:  Intractable vomiting with nausea, vomiting of unspecified type  Syncope and collapse  Lesion of ovary      Delman Kitten, MD 07/22/15 2056

## 2015-07-23 LAB — URINE DRUG SCREEN, QUALITATIVE (ARMC ONLY)
Amphetamines, Ur Screen: NOT DETECTED
BENZODIAZEPINE, UR SCRN: NOT DETECTED
Barbiturates, Ur Screen: NOT DETECTED
CANNABINOID 50 NG, UR ~~LOC~~: NOT DETECTED
Cocaine Metabolite,Ur ~~LOC~~: NOT DETECTED
MDMA (ECSTASY) UR SCREEN: NOT DETECTED
Methadone Scn, Ur: NOT DETECTED
Opiate, Ur Screen: NOT DETECTED
Phencyclidine (PCP) Ur S: NOT DETECTED
TRICYCLIC, UR SCREEN: NOT DETECTED

## 2015-07-23 LAB — COMPREHENSIVE METABOLIC PANEL
ALBUMIN: 2.9 g/dL — AB (ref 3.5–5.0)
ALT: 54 U/L (ref 14–54)
ANION GAP: 6 (ref 5–15)
AST: 32 U/L (ref 15–41)
Alkaline Phosphatase: 69 U/L (ref 38–126)
BILIRUBIN TOTAL: 1 mg/dL (ref 0.3–1.2)
BUN: 11 mg/dL (ref 6–20)
CALCIUM: 8.6 mg/dL — AB (ref 8.9–10.3)
CO2: 26 mmol/L (ref 22–32)
Chloride: 109 mmol/L (ref 101–111)
Creatinine, Ser: 0.82 mg/dL (ref 0.44–1.00)
GFR calc Af Amer: >60 mL/min (ref >60)
Glucose, Bld: 100 mg/dL — ABNORMAL HIGH (ref 65–99)
POTASSIUM: 3.7 mmol/L (ref 3.5–5.1)
SODIUM: 141 mmol/L (ref 135–145)
TOTAL PROTEIN: 5.4 g/dL — AB (ref 6.5–8.1)

## 2015-07-23 MED ORDER — IMIPRAMINE HCL 25 MG PO TABS
25.0000 mg | ORAL_TABLET | Freq: Every day | ORAL | Status: DC
  Administered 2015-07-23 – 2015-07-24 (×2): 25 mg via ORAL
  Filled 2015-07-23 (×2): qty 1

## 2015-07-23 MED ORDER — MIRABEGRON ER 50 MG PO TB24
50.0000 mg | ORAL_TABLET | Freq: Every day | ORAL | Status: DC
  Administered 2015-07-23 – 2015-07-24 (×2): 50 mg via ORAL
  Filled 2015-07-23 (×2): qty 1

## 2015-07-23 NOTE — Progress Notes (Signed)
Patient ID: Sherry Hernandez, female   DOB: 06-Jun-1966, 50 y.o.   MRN: [id number] [facility]  PCP: Sharyne Peach, MD  HPI/Subjective: Patient coming in with intractable nausea and vomiting. Patient has severe abdominal pain  8 out of 10 intensity. Some diarrhea. Family also sick.  Objective: Filed Vitals:   07/23/15 0912 07/23/15 1321  BP: 128/63 136/61  Pulse:  71  Temp:  98 F (36.7 C)  Resp:  17    Filed Weights   07/22/15 1745 07/22/15 2206  Weight: 103.874 kg (229 lb) 104.191 kg (229 lb 11.2 oz)    ROS: Review of Systems  Constitutional: Negative for fever and chills.  Eyes: Negative for blurred vision.  Respiratory: Negative for cough and shortness of breath.   Cardiovascular: Negative for chest pain.  Gastrointestinal: Positive for nausea, vomiting, abdominal pain and diarrhea. Negative for constipation.  Genitourinary: Negative for dysuria.  Musculoskeletal: Negative for joint pain.  Neurological: Negative for dizziness and headaches.   Exam: Physical Exam  Constitutional: She is oriented to person, place, and time.  HENT:  Nose: No mucosal edema.  Mouth/Throat: No oropharyngeal exudate or posterior oropharyngeal edema.  Eyes: Conjunctivae, EOM and lids are normal. Pupils are equal, round, and reactive to light.  Neck: No JVD present. Carotid bruit is not present. No edema present. No thyroid mass and no thyromegaly present.  Cardiovascular: S1 normal and S2 normal.  Exam reveals no gallop.   No murmur heard. Pulses:      Dorsalis pedis pulses are 2+ on the right side, and 2+ on the left side.  Respiratory: No respiratory distress. She has no wheezes. She has no rhonchi. She has no rales.  GI: Soft. Bowel sounds are normal. There is generalized tenderness.  Musculoskeletal:       Right ankle: She exhibits no swelling.       Left ankle: She exhibits no swelling.  Lymphadenopathy:    She has no cervical adenopathy.  Neurological: She is alert  and oriented to person, place, and time. No cranial nerve deficit.  Skin: Skin is warm. No rash noted. Nails show no clubbing.  Psychiatric: She has a normal mood and affect.      Data Reviewed: Basic Metabolic Panel:  Recent Labs Lab 07/21/15 1639 07/22/15 1819 07/23/15 0457  NA 140 137 141  K 4.2 3.5 3.7  CL 104 103 109  CO2 31 30 26   GLUCOSE 103* 113* 100*  BUN 15 14 11   CREATININE 0.82 0.93 0.82  CALCIUM 9.5 9.3 8.6*   Liver Function Tests:  Recent Labs Lab 07/21/15 1639 07/22/15 1819 07/23/15 0457  AST 37 38 32  ALT 51 62* 54  ALKPHOS 80 75 69  BILITOT 0.6 0.8 1.0  PROT 7.2 6.4* 5.4*  ALBUMIN 3.7 3.6 2.9*    Recent Labs Lab 07/21/15 1639 07/22/15 1819  LIPASE 38 34   CBC:  Recent Labs Lab 07/21/15 1639 07/22/15 1819  WBC 8.2 8.6  NEUTROABS  --  6.1  HGB 14.8 14.6  HCT 45.2 44.6  MCV 89.6 90.7  PLT 224 198    Studies: Ct Abdomen Pelvis W Contrast  07/22/2015  CLINICAL DATA:  Patient c/o abdominal pain. Pt reports being seen yesterday at ED and was diagnosed with norovirus. Today pt has been N/V/D and reports losing consciousness from frequently vomiting. Hx of cholecystectomy and hysterectomy. EXAM: CT ABDOMEN AND PELVIS WITH CONTRAST TECHNIQUE: Multidetector CT imaging of the abdomen and pelvis was performed using  the standard protocol following bolus administration of intravenous contrast. CONTRAST:  154mL OMNIPAQUE IOHEXOL 300 MG/ML  SOLN COMPARISON:  CT 01/18/2015 FINDINGS: Lower chest: Bibasilar atelectasis. Hepatobiliary: Postcholecystectomy. No biliary dilatation. Low-attenuation along the falciform ligament likely represents fatty infiltration (image 23, series 2). Small 4 mm lesion in the RIGHT hepatic lobe likely represents a benign cyst. Pancreas: Pancreas is normal. No ductal dilatation. No pancreatic inflammation. Spleen: Normal spleen Adrenals/urinary tract: Adrenal glands and kidneys are normal. The ureters and bladder normal.  Stomach/Bowel: Stomach, small bowel, appendix, and cecum are normal. The colon and rectosigmoid colon are normal. Vascular/Lymphatic: Abdominal aorta is normal caliber. There is no retroperitoneal or periportal lymphadenopathy. No pelvic lymphadenopathy. Reproductive: Post hysterectomy. There is a round low-attenuation lesion of the LEFT ovary with simple fluid attenuation measuring 4.3 cm. RIGHT ovary is small at 1.8 x 3.7 cm (image 30 of 76, series 2). Other: Small amount free fluid the posterior cul-de-sac on image 81, series 2 Musculoskeletal: No acute osseous abnormality. IMPRESSION: 1. No acute findings abdomen pelvis. 2. Normal appendix. 3. Trace free fluid the pelvis. 4. Rounded low-density lesion of the LEFT ovary has benign characteristics and likely represents a benign ovarian cyst. If patient is premenopausal no follow-up required. If patient is postmenopausal recommend follow-up pelvic ultrasound in 6-12 weeks. This recommendation follows ACR consensus guidelines: White Paper of the ACR Incidental Findings Committee II on Adnexal Findings. J Am Coll Radiol [phone]. Electronically Signed   By: Suzy Bouchard M.D.   On: 07/22/2015 20:25    Scheduled Meds: . heparin  5,000 Units Subcutaneous 3 times per day  . imipramine  25 mg Oral QHS  . lisinopril  10 mg Oral Daily  . mirabegron ER  50 mg Oral QHS  . multivitamin with minerals  1 tablet Oral Daily  . promethazine  25 mg Intravenous Once   Continuous Infusions: . sodium chloride 100 mL/hr at 07/22/15 2226    Assessment/Plan:  1. Gastroenteritis versus food poisoning. Patient still unable to tolerate diet. Still with severe abdominal pain. When necessary nausea and pain medications IV and orally. Patient must be able to tolerate diet prior to going home. Stool studies still pending. 2. Essential hypertension- holding blood pressure medication 3. Ovarian cyst- unlikely causing the pain 4. Overactive bladder continue  medications  Code Status:     Code Status Orders        Start     Ordered   07/22/15 2058  Full code   Continuous     07/22/15 2058    Code Status History    Date Active Date Inactive Code Status Order ID Comments User Context   This patient has a current code status but no historical code status.     Family Communication: Family at bedside Disposition Plan: Home once tolerates diet  Time spent: 25 minutes  [place], [place] [facility]

## 2015-07-23 NOTE — Progress Notes (Signed)
Patient ID: Sherry Hernandez, female   DOB: 1966/02/20, 50 y.o.   MRN: [id number] [facility] was admitted to the Hospital on 07/22/2015 and is still currently in the hospital. She will be evaluated on a daily basis until discharge.  Another note will be given upon discharge on when to return to work.  Loletha Grayer M.D on 07/23/2015,at 10:01 AM  [facility] [place]

## 2015-07-24 DIAGNOSIS — R1110 Vomiting, unspecified: Secondary | ICD-10-CM | POA: Diagnosis not present

## 2015-07-24 MED ORDER — ENOXAPARIN SODIUM 40 MG/0.4ML ~~LOC~~ SOLN
40.0000 mg | SUBCUTANEOUS | Status: DC
  Administered 2015-07-24: 40 mg via SUBCUTANEOUS
  Filled 2015-07-24: qty 0.4

## 2015-07-24 MED ORDER — PANTOPRAZOLE SODIUM 40 MG IV SOLR
40.0000 mg | INTRAVENOUS | Status: DC
Start: 2015-07-24 — End: 2015-07-25
  Administered 2015-07-24 – 2015-07-25 (×2): 40 mg via INTRAVENOUS
  Filled 2015-07-24 (×2): qty 40

## 2015-07-24 NOTE — Consult Note (Signed)
[facility]  [address], [place], [place] [address] Phone: [phone] Fax : [phone]  Consultation  Referring Provider:     No ref. provider found Primary Care Physician:  Sharyne Peach, MD Primary Gastroenterologist:           Reason for Consultation:     Nausea vomiting diarrhea with abdominal pain  Date of Admission:  07/22/2015 Date of Consultation:  07/24/2015         HPI:   Sherry Hernandez is a 50 y.o. female who comes in with nausea vomiting and diarrhea with abdominal pain. The patient states that her husband had a similar illness just prior to her her having these symptoms. The patient reports that she has been able to tolerate her diet and states that she has not had a bowel movement since yesterday and feels constipated. The patient continues to have abdominal pain despite the nausea vomiting and diarrhea resolving. She states that it is worse when she moves around.  Past Medical History  Diagnosis Date  . Overactive bladder   . Dysuria   . Obesity   . HTN (hypertension)   . Stress incontinence   . Sensory urge incontinence   . Frequency     Past Surgical History  Procedure Laterality Date  . Abdominal hysterectomy    . Colonoscopy with propofol N/A 03/24/2015    Procedure: COLONOSCOPY WITH PROPOFOL;  Surgeon: Josefine Class, MD;  Location: [facility] ENDOSCOPY;  Service: Endoscopy;  Laterality: N/A;    Prior to Admission medications   Medication Sig Start Date End Date Taking? Authorizing Provider  imipramine (TOFRANIL) 25 MG tablet Take 25 mg by mouth at bedtime.   Yes Historical Provider, MD  dicyclomine (BENTYL) 10 MG capsule Take 1 capsule (10 mg total) by mouth 3 (three) times daily before meals. 07/21/15 08/04/15  Jenise V Bacon Menshew, PA-C  lisinopril-hydrochlorothiazide (PRINZIDE,ZESTORETIC) 10-12.5 MG per tablet Take 1 tablet by mouth daily.    Historical Provider, MD  losartan (COZAAR) 50 MG tablet Take 50 mg by mouth daily.  Reported on 07/22/2015    Historical Provider, MD  mirabegron ER (MYRBETRIQ) 50 MG TB24 tablet Take 1 tablet (50 mg total) by mouth daily. Patient not taking: Reported on 03/24/2015 01/05/15   Ardis Hughs, MD  mirabegron ER (MYRBETRIQ) 50 MG TB24 tablet Take 1 tablet (50 mg total) by mouth daily. Patient not taking: Reported on 07/22/2015 01/30/15   Nori Riis, PA-C  multivitamin-iron-minerals-folic acid (CENTRUM) chewable tablet Chew 1 tablet by mouth daily.    Historical Provider, MD  ondansetron (ZOFRAN) 4 MG tablet Take 1 tablet (4 mg total) by mouth every 6 (six) hours as needed for nausea or vomiting. 07/21/15   Jenise V Bacon Menshew, PA-C  phenazopyridine (PYRIDIUM) 200 MG tablet Take 1 tablet (200 mg total) by mouth 3 (three) times daily as needed for pain. Patient not taking: Reported on 03/24/2015 01/18/15 01/18/16  Gregor Hams, MD    Family History  Problem Relation Age of Onset  . Cancer Father   . Cancer Mother   . Breast cancer Mother 68  . Cancer Sister      Social History  Substance Use Topics  . Smoking status: Former Smoker -- 7 years    Quit date: 09/20/2007  . Smokeless tobacco: None  . Alcohol Use: No    Allergies as of 07/22/2015  . (No Known Allergies)    Review of Systems:    All systems  reviewed and negative except where noted in HPI.   Physical Exam:  Vital signs in last 24 hours: Temp:  [97.5 F (36.4 C)-98.8 F (37.1 C)] 98.8 F (37.1 C) (01/16 1309) Pulse Rate:  [64-71] 71 (01/16 1309) Resp:  [16-17] 16 (01/16 1309) BP: (105-132)/(60-84) 132/64 mmHg (01/16 1309) SpO2:  [97 %-98 %] 98 % (01/16 1309) Last BM Date: 07/21/15 General:   Pleasant, cooperative in NAD Head:  Normocephalic and atraumatic. Eyes:   No icterus.   Conjunctiva pink. PERRLA. Ears:  Normal auditory acuity. Neck:  Supple; no masses or thyroidomegaly Lungs: Respirations even and unlabored. Lungs clear to auscultation bilaterally.   No wheezes, crackles, or  rhonchi.  Heart:  Regular rate and rhythm;  Without murmur, clicks, rubs or gallops Abdomen:  Soft, nondistended, positive tenderness with a positive carnet sign. Normal bowel sounds. No appreciable masses or hepatomegaly.  No rebound or guarding.  Rectal:  Not performed. Msk:  Symmetrical without gross deformities.  Strength  Extremities:  Without edema, cyanosis or clubbing. Neurologic:  Alert and oriented x3;  grossly normal neurologically. Skin:  Intact without significant lesions or rashes. Cervical Nodes:  No significant cervical adenopathy. Psych:  Alert and cooperative. Normal affect.  LAB RESULTS:  Recent Labs  07/22/15 1819  WBC 8.6  HGB 14.6  HCT 44.6  PLT 198   BMET  Recent Labs  07/22/15 1819 07/23/15 0457  NA 137 141  K 3.5 3.7  CL 103 109  CO2 30 26  GLUCOSE 113* 100*  BUN 14 11  CREATININE 0.93 0.82  CALCIUM 9.3 8.6*   LFT  Recent Labs  07/23/15 0457  PROT 5.4*  ALBUMIN 2.9*  AST 32  ALT 54  ALKPHOS 69  BILITOT 1.0   PT/INR No results for input(s): LABPROT, INR in the last 72 hours.  STUDIES: No results found.    Impression / Plan:   Sherry Hernandez is a 50 y.o. y/o female witwho was admitted with a viral gastroenteritis and abdominal pain that is musculoskeletal from repeated vomiting. The legs were raised 6 inches above the bed with 1 finger palpation of the abdominal wall muscles which reproduced the tenderness she was reporting. The patient has been told to treat this as a muscle or skeletal pain and use warm compresses and avoid further straining of the abdominal wall muscles. There is nothing further to do from a GI point of view since all of her GI symptoms have resolved. I will sign off at this time if you have any questions please do not hesitate to call.   Thank you for involving me in the care of this patient.        Ollen Bowl, MD  07/24/2015, 8:23 PM   Note: This dictation was prepared with Dragon dictation along with  smaller phrase technology. Any transcriptional errors that result from this process are unintentional.

## 2015-07-24 NOTE — Progress Notes (Signed)
Pt does not want to eat clear liquid diet, she wants peanut butter and crackers. States her nausea has resided. GI Md notified and said "she can have whatever she wants". Regular diet ordered and will keep order to digress back to liquids if she does not tolerate

## 2015-07-24 NOTE — Progress Notes (Signed)
Patient ID: Sherry Hernandez, female   DOB: 1966/03/10, 50 y.o.   MRN: [id number] [facility]  PCP: Sharyne Peach, MD  HPI/Subjective: Patient admitted with abdominal pain nausea vomiting and diarrhea. Has not had any further diarrhea here. Husband also sick. Patient still feels miserable with abdominal pain and nausea. Unable to tolerate diet at this point.  Objective: Filed Vitals:   07/24/15 0637 07/24/15 1309  BP: 105/60 132/64  Pulse: 64 71  Temp: 97.9 F (36.6 C) 98.8 F (37.1 C)  Resp: 17 16    Filed Weights   07/22/15 1745 07/22/15 2206  Weight: 103.874 kg (229 lb) 104.191 kg (229 lb 11.2 oz)    ROS: Review of Systems  Constitutional: Negative for fever and chills.  Eyes: Negative for blurred vision.  Respiratory: Negative for cough and shortness of breath.   Cardiovascular: Negative for chest pain.  Gastrointestinal: Positive for nausea, vomiting, abdominal pain and diarrhea. Negative for constipation.  Genitourinary: Negative for dysuria.  Musculoskeletal: Negative for joint pain.  Neurological: Negative for dizziness and headaches.   Exam: Physical Exam  Constitutional: She is oriented to person, place, and time.  HENT:  Nose: No mucosal edema.  Mouth/Throat: No oropharyngeal exudate or posterior oropharyngeal edema.  Eyes: Conjunctivae, EOM and lids are normal. Pupils are equal, round, and reactive to light.  Neck: No JVD present. Carotid bruit is not present. No edema present. No thyroid mass and no thyromegaly present.  Cardiovascular: S1 normal and S2 normal.  Exam reveals no gallop.   No murmur heard. Pulses:      Dorsalis pedis pulses are 2+ on the right side, and 2+ on the left side.  Respiratory: No respiratory distress. She has no wheezes. She has no rhonchi. She has no rales.  GI: Soft. Bowel sounds are normal. There is generalized tenderness.  Musculoskeletal:       Right ankle: She exhibits no swelling.       Left ankle: She  exhibits no swelling.  Lymphadenopathy:    She has no cervical adenopathy.  Neurological: She is alert and oriented to person, place, and time. No cranial nerve deficit.  Skin: Skin is warm. No rash noted. Nails show no clubbing.  Psychiatric: She has a normal mood and affect.    Data Reviewed: Basic Metabolic Panel:  Recent Labs Lab 07/21/15 1639 07/22/15 1819 07/23/15 0457  NA 140 137 141  K 4.2 3.5 3.7  CL 104 103 109  CO2 31 30 26   GLUCOSE 103* 113* 100*  BUN 15 14 11   CREATININE 0.82 0.93 0.82  CALCIUM 9.5 9.3 8.6*   Liver Function Tests:  Recent Labs Lab 07/21/15 1639 07/22/15 1819 07/23/15 0457  AST 37 38 32  ALT 51 62* 54  ALKPHOS 80 75 69  BILITOT 0.6 0.8 1.0  PROT 7.2 6.4* 5.4*  ALBUMIN 3.7 3.6 2.9*    Recent Labs Lab 07/21/15 1639 07/22/15 1819  LIPASE 38 34   CBC:  Recent Labs Lab 07/21/15 1639 07/22/15 1819  WBC 8.2 8.6  NEUTROABS  --  6.1  HGB 14.8 14.6  HCT 45.2 44.6  MCV 89.6 90.7  PLT 224 198    Studies: Ct Abdomen Pelvis W Contrast  07/22/2015  CLINICAL DATA:  Patient c/o abdominal pain. Pt reports being seen yesterday at ED and was diagnosed with norovirus. Today pt has been N/V/D and reports losing consciousness from frequently vomiting. Hx of cholecystectomy and hysterectomy. EXAM: CT ABDOMEN AND PELVIS WITH CONTRAST  TECHNIQUE: Multidetector CT imaging of the abdomen and pelvis was performed using the standard protocol following bolus administration of intravenous contrast. CONTRAST:  121mL OMNIPAQUE IOHEXOL 300 MG/ML  SOLN COMPARISON:  CT 01/18/2015 FINDINGS: Lower chest: Bibasilar atelectasis. Hepatobiliary: Postcholecystectomy. No biliary dilatation. Low-attenuation along the falciform ligament likely represents fatty infiltration (image 23, series 2). Small 4 mm lesion in the RIGHT hepatic lobe likely represents a benign cyst. Pancreas: Pancreas is normal. No ductal dilatation. No pancreatic inflammation. Spleen: Normal spleen  Adrenals/urinary tract: Adrenal glands and kidneys are normal. The ureters and bladder normal. Stomach/Bowel: Stomach, small bowel, appendix, and cecum are normal. The colon and rectosigmoid colon are normal. Vascular/Lymphatic: Abdominal aorta is normal caliber. There is no retroperitoneal or periportal lymphadenopathy. No pelvic lymphadenopathy. Reproductive: Post hysterectomy. There is a round low-attenuation lesion of the LEFT ovary with simple fluid attenuation measuring 4.3 cm. RIGHT ovary is small at 1.8 x 3.7 cm (image 30 of 76, series 2). Other: Small amount free fluid the posterior cul-de-sac on image 81, series 2 Musculoskeletal: No acute osseous abnormality. IMPRESSION: 1. No acute findings abdomen pelvis. 2. Normal appendix. 3. Trace free fluid the pelvis. 4. Rounded low-density lesion of the LEFT ovary has benign characteristics and likely represents a benign ovarian cyst. If patient is premenopausal no follow-up required. If patient is postmenopausal recommend follow-up pelvic ultrasound in 6-12 weeks. This recommendation follows ACR consensus guidelines: White Paper of the ACR Incidental Findings Committee II on Adnexal Findings. J Am Coll Radiol [phone]. Electronically Signed   By: Suzy Bouchard M.D.   On: 07/22/2015 20:25    Scheduled Meds: . enoxaparin (LOVENOX) injection  40 mg Subcutaneous Q24H  . imipramine  25 mg Oral QHS  . mirabegron ER  50 mg Oral QHS  . multivitamin with minerals  1 tablet Oral Daily  . pantoprazole (PROTONIX) IV  40 mg Intravenous Q24H  . promethazine  25 mg Intravenous Once   Continuous Infusions: . sodium chloride 100 mL/hr at 07/24/15 1406    Assessment/Plan:  1. Gastroenteritis versus food poisoning. Patient still unable to tolerate diet. Still with severe abdominal pain. When necessary nausea and pain medications IV and orally. Patient must be able to tolerate diet prior to going home. Stool studies ordered but the patient has not had a  bowel movement since being here.  2. Essential hypertension- holding blood pressure medication 3. Ovarian cyst- unlikely causing the pain 4. Overactive bladder continue medications  Code Status:     Code Status Orders        Start     Ordered   07/22/15 2058  Full code   Continuous     07/22/15 2058    Code Status History    Date Active Date Inactive Code Status Order ID Comments User Context   This patient has a current code status but no historical code status.     Disposition Plan: Home once tolerates diet  Time spent: 22 minutes  Loletha Grayer  [facility]

## 2015-07-25 LAB — BASIC METABOLIC PANEL
Anion gap: 6 (ref 5–15)
BUN: 8 mg/dL (ref 6–20)
CALCIUM: 8.5 mg/dL — AB (ref 8.9–10.3)
CO2: 28 mmol/L (ref 22–32)
CREATININE: 0.9 mg/dL (ref 0.44–1.00)
Chloride: 106 mmol/L (ref 101–111)
GFR calc non Af Amer: >60 mL/min (ref >60)
Glucose, Bld: 98 mg/dL (ref 65–99)
Potassium: 3.7 mmol/L (ref 3.5–5.1)
SODIUM: 140 mmol/L (ref 135–145)

## 2015-07-25 NOTE — Discharge Summary (Addendum)
[place] at [place] NAME: Sherry Hernandez    MR#:  [id number]  DATE OF BIRTH:  1965-08-02  DATE OF ADMISSION:  07/22/2015 ADMITTING PHYSICIAN: Lytle Butte, MD  DATE OF DISCHARGE: 07/25/2015  1:35 PM  PRIMARY CARE PHYSICIAN: Sharyne Peach, MD    ADMISSION DIAGNOSIS:  Syncope and collapse [R55] Lesion of ovary [N83.9] Intractable vomiting with nausea, vomiting of unspecified type [R11.10]   DISCHARGE DIAGNOSIS:  Gastroenteritis versus food poisoning  SECONDARY DIAGNOSIS:   Past Medical History  Diagnosis Date  . Overactive bladder   . Dysuria   . Obesity   . HTN (hypertension)   . Stress incontinence   . Sensory urge incontinence   . Frequency     HOSPITAL COURSE:   1. Gastroenteritis versus food poisoning.  better abdominal pain, possible due to abdominal muscle. When necessary nausea and pain medications IV and orally.  Stool studies ordered but the patient has not had a bowel movement since being here. no nausea, vomitting. 2. Essential hypertension- holding blood pressure medication. Resume lisinopril-HCTZ but hold losartan due to low side blood pressure after discharge. 3. Ovarian cyst- unlikely causing the pain 4. Overactive bladder continue medications  DISCHARGE CONDITIONS:   Stable, discharged to home today.  CONSULTS OBTAINED:  Treatment Team:  Lytle Butte, MD  DRUG ALLERGIES:  No Known Allergies  DISCHARGE MEDICATIONS:   Discharge Medication List as of 07/25/2015 11:55 AM    CONTINUE these medications which have NOT CHANGED   Details  imipramine (TOFRANIL) 25 MG tablet Take 25 mg by mouth at bedtime., Until Discontinued, Historical Med    dicyclomine (BENTYL) 10 MG capsule Take 1 capsule (10 mg total) by mouth 3 (three) times daily before meals., Starting 07/21/2015, Until Fri 08/04/15, Print    lisinopril-hydrochlorothiazide (PRINZIDE,ZESTORETIC) 10-12.5 MG per tablet Take 1 tablet by mouth  daily., Until Discontinued, Historical Med    !! mirabegron ER (MYRBETRIQ) 50 MG TB24 tablet Take 1 tablet (50 mg total) by mouth daily., Starting 01/05/2015, Until Discontinued, No Print    !! mirabegron ER (MYRBETRIQ) 50 MG TB24 tablet Take 1 tablet (50 mg total) by mouth daily., Starting 01/30/2015, Until Discontinued, Fax    multivitamin-iron-minerals-folic acid (CENTRUM) chewable tablet Chew 1 tablet by mouth daily., Until Discontinued, Historical Med    ondansetron (ZOFRAN) 4 MG tablet Take 1 tablet (4 mg total) by mouth every 6 (six) hours as needed for nausea or vomiting., Starting 07/21/2015, Until Discontinued, Print    phenazopyridine (PYRIDIUM) 200 MG tablet Take 1 tablet (200 mg total) by mouth 3 (three) times daily as needed for pain., Starting 01/18/2015, Until Thu 01/18/16, Print     !! - Potential duplicate medications found. Please discuss with provider.    STOP taking these medications     losartan (COZAAR) 50 MG tablet          DISCHARGE INSTRUCTIONS:   If you experience worsening of your admission symptoms, develop shortness of breath, life threatening emergency, suicidal or homicidal thoughts you must seek medical attention immediately by calling 911 or calling your MD immediately  if symptoms less severe.  You Must read complete instructions/literature along with all the possible adverse reactions/side effects for all the Medicines you take and that have been prescribed to you. Take any new Medicines after you have completely understood and accept all the possible adverse reactions/side effects.   Please note  You were cared for by a hospitalist during your  hospital stay. If you have any questions about your discharge medications or the care you received while you were in the hospital after you are discharged, you can call the unit and asked to speak with the hospitalist on call if the hospitalist that took care of you is not available. Once you are discharged, your  primary care physician will handle any further medical issues. Please note that NO REFILLS for any discharge medications will be authorized once you are discharged, as it is imperative that you return to your primary care physician (or establish a relationship with a primary care physician if you do not have one) for your aftercare needs so that they can reassess your need for medications and monitor your lab values.    Today   SUBJECTIVE   Mild abdominal pain.   VITAL SIGNS:  Blood pressure 115/55, pulse 64, temperature 98 F (36.7 C), temperature source Oral, resp. rate 16, height 5\' 6"  (1.676 m), weight 104.191 kg (229 lb 11.2 oz), SpO2 95 %.  I/O:   Intake/Output Summary (Last 24 hours) at 07/25/15 1815 Last data filed at 07/25/15 0518  Gross per 24 hour  Intake   2289 ml  Output   1800 ml  Net    489 ml    PHYSICAL EXAMINATION:  GENERAL:  50 y.o.-year-old patient lying in the bed with no acute distress.  EYES: Pupils equal, round, reactive to light and accommodation. No scleral icterus. Extraocular muscles intact.  HEENT: Head atraumatic, normocephalic. Oropharynx and nasopharynx clear.  NECK:  Supple, no jugular venous distention. No thyroid enlargement, no tenderness.  LUNGS: Normal breath sounds bilaterally, no wheezing, rales,rhonchi or crepitation. No use of accessory muscles of respiration.  CARDIOVASCULAR: S1, S2 normal. No murmurs, rubs, or gallops.  ABDOMEN: Soft, non-tender, non-distended. Bowel sounds present. No organomegaly or mass.  EXTREMITIES: No pedal edema, cyanosis, or clubbing.  NEUROLOGIC: Cranial nerves II through XII are intact. Muscle strength 5/5 in all extremities. Sensation intact. Gait not checked.  PSYCHIATRIC: The patient is alert and oriented x 3.  SKIN: No obvious rash, lesion, or ulcer.   DATA REVIEW:   CBC  Recent Labs Lab 07/22/15 1819  WBC 8.6  HGB 14.6  HCT 44.6  PLT 198    Chemistries   Recent Labs Lab 07/23/15 0457  07/25/15 0536  NA 141 140  K 3.7 3.7  CL 109 106  CO2 26 28  GLUCOSE 100* 98  BUN 11 8  CREATININE 0.82 0.90  CALCIUM 8.6* 8.5*  AST 32  --   ALT 54  --   ALKPHOS 69  --   BILITOT 1.0  --     Cardiac Enzymes No results for input(s): TROPONINI in the last 168 hours.  Microbiology Results  No results found for this or any previous visit.  RADIOLOGY:  No results found.      Management plans discussed with the patient, family and they are in agreement.  CODE STATUS:  Code Status History    Date Active Date Inactive Code Status Order ID Comments User Context   07/22/2015  8:58 PM 07/25/2015  4:36 PM Full Code [id number]  Lytle Butte, MD ED      TOTAL TIME TAKING CARE OF THIS PATIENT: 35  minutes.    Demetrios Loll M.D on 07/25/2015 at 6:15 PM  Between 7am to 6pm - Pager - [phone]  After 6pm go to [email or web address] - [facility]  Office  [phone]  CC: Primary care physician; Sharyne Peach, MD

## 2015-07-25 NOTE — Progress Notes (Signed)
Per Dr. Dorothey Baseman note, pt has had a BM since admission. No BM was noted in the chart, upon questioning pt, pt states, "I have not had a Bm since I came to the hospital. I even feel like I am constipated."

## 2015-07-25 NOTE — Discharge Instructions (Signed)
Heart healthy diet. °Activity as tolerated. °

## 2015-07-25 NOTE — Progress Notes (Signed)
Sherry Hernandez at [place] was admitted to the Hospital on 07/22/2015 and Discharged  07/25/2015 and should be excused from work/school   for 7  days starting 07/22/2015 , may return to work/school without any restrictions.  Demetrios Loll MD.  Demetrios Loll M.D on 07/25/2015,at 10:02 AM  [place] at [facility]  [phone]

## 2015-07-25 NOTE — Progress Notes (Signed)
IVs were removed. Discharge instructions, follow-up appointments, and prescriptions were provided to the pt. The pt was taken downstairs via wheelchair.

## 2015-11-05 ENCOUNTER — Encounter: Payer: Self-pay | Admitting: Emergency Medicine

## 2015-11-05 ENCOUNTER — Emergency Department: Payer: No Typology Code available for payment source

## 2015-11-05 ENCOUNTER — Emergency Department
Admission: EM | Admit: 2015-11-05 | Discharge: 2015-11-05 | Disposition: A | Discharge status: Home / Self Care | Payer: No Typology Code available for payment source | Attending: Emergency Medicine | Admitting: Emergency Medicine

## 2015-11-05 DIAGNOSIS — I10 Essential (primary) hypertension: Secondary | ICD-10-CM | POA: Insufficient documentation

## 2015-11-05 DIAGNOSIS — Y9389 Activity, other specified: Secondary | ICD-10-CM | POA: Diagnosis not present

## 2015-11-05 DIAGNOSIS — M7080 Other soft tissue disorders related to use, overuse and pressure of unspecified site: Secondary | ICD-10-CM

## 2015-11-05 DIAGNOSIS — Y990 Civilian activity done for income or pay: Secondary | ICD-10-CM | POA: Diagnosis not present

## 2015-11-05 DIAGNOSIS — E669 Obesity, unspecified: Secondary | ICD-10-CM | POA: Insufficient documentation

## 2015-11-05 DIAGNOSIS — Z87891 Personal history of nicotine dependence: Secondary | ICD-10-CM | POA: Diagnosis not present

## 2015-11-05 DIAGNOSIS — X503XXA Overexertion from repetitive movements, initial encounter: Secondary | ICD-10-CM | POA: Diagnosis not present

## 2015-11-05 DIAGNOSIS — M25531 Pain in right wrist: Secondary | ICD-10-CM | POA: Diagnosis present

## 2015-11-05 DIAGNOSIS — S63501A Unspecified sprain of right wrist, initial encounter: Secondary | ICD-10-CM | POA: Diagnosis not present

## 2015-11-05 DIAGNOSIS — Y9289 Other specified places as the place of occurrence of the external cause: Secondary | ICD-10-CM | POA: Insufficient documentation

## 2015-11-05 DIAGNOSIS — Z79899 Other long term (current) drug therapy: Secondary | ICD-10-CM | POA: Insufficient documentation

## 2015-11-05 DIAGNOSIS — T148XXA Other injury of unspecified body region, initial encounter: Secondary | ICD-10-CM

## 2015-11-05 MED ORDER — NAPROXEN 500 MG PO TBEC: 500.0000 mg | DELAYED_RELEASE_TABLET | Freq: Two times a day (BID) | ORAL | Status: DC

## 2015-11-05 MED ORDER — CYCLOBENZAPRINE HCL 5 MG PO TABS: 5.0000 mg | ORAL_TABLET | Freq: Three times a day (TID) | ORAL | Status: DC | PRN

## 2015-11-05 NOTE — ED Provider Notes (Signed)
[facility] Emergency Department Provider Note ____________________________________________  Time seen: 1155  I have reviewed the triage vital signs and the nursing notes.  HISTORY  Chief Complaint  Arm Pain  HPI Sherry Hernandez is a 50 y.o. female sensitivity ED for evaluation of right arm and wrist pain with localized swelling to the hand. She denies any particular injury, accident, trauma or contusion. 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 She took it upon herself to buy a soft wrist wrapped from the local drugstore, but denies any significant benefit or deviation of pain. He reports pain with flexion and extension of the wrist. She also notes some discomfort to the dorsal knuckles with composite fist. She denies any history of carpal tunnel, tendinitis, or epicondylitis. She was overall discomfort 9/10 in triage.  Past Medical History  Diagnosis Date  . Overactive bladder   . Dysuria   . Obesity   . HTN (hypertension)   . Stress incontinence   . Sensory urge incontinence   . Frequency     Patient Active Problem List   Diagnosis Date Noted  . Uncontrollable vomiting   . Intractable nausea and vomiting 07/22/2015    Past Surgical History  Procedure Laterality Date  . Abdominal hysterectomy    . Colonoscopy with propofol N/A 03/24/2015    Procedure: COLONOSCOPY WITH PROPOFOL;  Surgeon: Josefine Class, MD;  Location: [facility] ENDOSCOPY;  Service: Endoscopy;  Laterality: N/A;    Current Outpatient Rx  Name  Route  Sig  Dispense  Refill  . cyclobenzaprine (FLEXERIL) 5 MG tablet   Oral   Take 1 tablet (5 mg total) by mouth every 8 (eight) hours as needed for muscle spasms.   12 tablet   0   . EXPIRED: dicyclomine (BENTYL) 10 MG capsule   Oral   Take 1 capsule (10 mg total) by mouth 3 (three)  times daily before meals.   12 capsule   0   . imipramine (TOFRANIL) 25 MG tablet   Oral   Take 25 mg by mouth at bedtime.         Marland Kitchen lisinopril-hydrochlorothiazide (PRINZIDE,ZESTORETIC) 10-12.5 MG per tablet   Oral   Take 1 tablet by mouth daily.         . mirabegron ER (MYRBETRIQ) 50 MG TB24 tablet   Oral   Take 1 tablet (50 mg total) by mouth daily. Patient not taking: Reported on 03/24/2015   30 tablet      . mirabegron ER (MYRBETRIQ) 50 MG TB24 tablet   Oral   Take 1 tablet (50 mg total) by mouth daily. Patient not taking: Reported on 07/22/2015   30 tablet   0   . multivitamin-iron-minerals-folic acid (CENTRUM) chewable tablet   Oral   Chew 1 tablet by mouth daily.         . naproxen (EC NAPROSYN) 500 MG EC tablet   Oral   Take 1 tablet (500 mg total) by mouth 2 (two) times daily with a meal.   30 tablet   0   . ondansetron (ZOFRAN) 4 MG tablet   Oral   Take 1 tablet (4 mg total) by mouth every 6 (six) hours as needed for nausea or vomiting.   15 tablet   0   . phenazopyridine (PYRIDIUM) 200 MG tablet  Oral   Take 1 tablet (200 mg total) by mouth 3 (three) times daily as needed for pain. Patient not taking: Reported on 03/24/2015   20 tablet   0    Allergies Review of patient's allergies indicates no known allergies.  Family History  Problem Relation Age of Onset  . Cancer Father   . Cancer Mother   . Breast cancer Mother 34  . Cancer Sister     Social History Social History  Substance Use Topics  . Smoking status: Former Smoker -- 7 years    Quit date: 09/20/2007  . Smokeless tobacco: None  . Alcohol Use: No    Review of Systems  Constitutional: Negative for fever. Musculoskeletal: Negative for back pain. Right hand and wrist pain as above Skin: Negative for rash. Neurological: Negative for headaches, focal weakness or numbness. ____________________________________________  PHYSICAL EXAM:  VITAL SIGNS: ED Triage Vitals  Enc  Vitals Group     BP 11/05/15 1010 119/68 mmHg     Pulse Rate 11/05/15 1010 67     Resp 11/05/15 1010 18     Temp 11/05/15 1010 97.7 F (36.5 C)     Temp Source 11/05/15 1010 Oral     SpO2 11/05/15 1010 97 %     Weight 11/05/15 1010 250 lb (113.399 kg)     Height 11/05/15 1010 5\' 6"  (1.676 m)     Head Cir --      Peak Flow --      Pain Score 11/05/15 1011 9     Pain Loc --      Pain Edu? --      Excl. in [place]? --     Constitutional: Alert and oriented. Well appearing and in no distress. Head: Normocephalic and atraumatic. Cardiovascular: Normal distal pulses and cap refill Respiratory: Normal respiratory effort.  Musculoskeletal: Patient with obvious soft tissue swelling to the second MCP dorsally of the right hand. She has increased pain with composite fist. She otherwise is noted to have normal wrist flexion and extension however extension does elicit some pain at the lateral epicondyle. She has ability to pronate and supinate without difficulty and has normal elbow range of motion. Negative Finkelstein on exam. No carpal or cubital Tinel's. Her shoulder exam is benign on the right. Nontender with normal range of motion in all other extremities.  Neurologic: Cranial nerves II through XII grossly intact. Normal intrinsic and opposition testing is appreciated. Normal gait without ataxia. Normal speech and language. No gross focal neurologic deficits are appreciated. Skin:  Skin is warm, dry and intact. No rash noted. ____________________________________________   RADIOLOGY Right Hand Right Wrist  IMPRESSION: 1. No acute osseous injury of the right hand. 2. No acute osseous injury of the right wrist.  I, Naydeline Morace, Dannielle Karvonen, personally viewed and evaluated these images (plain radiographs) as part of my medical decision making, as well as reviewing the written report by the radiologist. 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 She was fitted with a wrist cock-up splint for protection and support. She is reassured by her negative x-rays of the hand and wrist. She is advised follow-up with Dr. Earnestine Leys for ongoing symptom management. Prescriptions are provided for EC Naprosyn and Flexeril to dose. She is advised on some simple hand and wrist stretches to prevent symptoms.  ____________________________________________  FINAL CLINICAL  IMPRESSION(S) / ED DIAGNOSES  Final diagnoses:  Right wrist sprain, initial encounter  Repetitive motion injury      Melvenia Needles, PA-C 11/05/15 [place], PA-C 11/05/15 1559  Harvest Dark, MD 11/08/15 2242

## 2015-11-05 NOTE — ED Notes (Signed)
Pt presents to ED with reports of right arm pain and swelling. Pt states saw PCP the other day and told to take ibuprofen. Pt states ibuprofen is not relieving the pain. Pt denies injury. Pt demonstrates movement but states it hurts when she does move it.

## 2015-11-05 NOTE — Discharge Instructions (Signed)
Cryotherapy Cryotherapy is when you put ice on your injury. Ice helps lessen pain and puffiness (swelling) after an injury. Ice works the best when you start using it in the first 24 to 48 hours after an injury. HOME CARE  Put a dry or damp towel between the ice pack and your skin.  You may press gently on the ice pack.  Leave the ice on for no more than 10 to 20 minutes at a time.  Check your skin after 5 minutes to make sure your skin is okay.  Rest at least 20 minutes between ice pack uses.  Stop using ice when your skin loses feeling (numbness).  Do not use ice on someone who cannot tell you when it hurts. This includes small children and people with memory problems (dementia). GET HELP RIGHT AWAY IF:  You have white spots on your skin.  Your skin turns blue or pale.  Your skin feels waxy or hard.  Your puffiness gets worse. MAKE SURE YOU:   Understand these instructions.  Will watch your condition.  Will get help right away if you are not doing well or get worse.   This information is not intended to replace advice given to you by your health care provider. Make sure you discuss any questions you have with your health care provider.   Document Released: 12/11/2007 Document Revised: 09/16/2011 Document Reviewed: 02/14/2011 Elsevier Interactive Patient Education 2016 [employer]  Wrist Sprain With Rehab A sprain is an injury in which a ligament that maintains the proper alignment of a joint is partially or completely torn. The ligaments of the wrist are susceptible to sprains. Sprains are classified into three categories. Grade 1 sprains cause pain, but the tendon is not lengthened. Grade 2 sprains include a lengthened ligament because the ligament is stretched or partially ruptured. With grade 2 sprains there is still function, although the function may be diminished. Grade 3 sprains are characterized by a complete tear of the tendon or muscle, and function is usually  impaired. SYMPTOMS   Pain tenderness, inflammation, and/or bruising (contusion) of the injury.  A "pop" or tear felt and/or heard at the time of injury.  Decreased wrist function. CAUSES  A wrist sprain occurs when a force is placed on one or more ligaments that is greater than it/they can withstand. Common mechanisms of injury include:  Catching a ball with your hands.  Repetitive and/ or strenuous extension or flexion of the wrist. RISK INCREASES WITH:  Previous wrist injury.  Contact sports (boxing or wrestling).  Activities in which falling is common.  Poor strength and flexibility.  Improperly fitted or padded protective equipment. PREVENTION  Warm up and stretch properly before activity.  Allow for adequate recovery between workouts.  Maintain physical fitness:  Strength, flexibility, and endurance.  Cardiovascular fitness.  Protect the wrist joint by limiting its motion with the use of taping, braces, or splints.  Protect the wrist after injury for 6 to 12 months. PROGNOSIS  The prognosis for wrist sprains depends on the degree of injury. Grade 1 sprains require 2 to 6 weeks of treatment. Grade 2 sprains require 6 to 8 weeks of treatment, and grade 3 sprains require up to 12 weeks.  RELATED COMPLICATIONS   Prolonged healing time, if improperly treated or re-injured.  Recurrent symptoms that result in a chronic problem.  Injury to nearby structures (bone, cartilage, nerves, or tendons).  Arthritis of the wrist.  Inability to compete in athletics at a high  level.  Wrist stiffness or weakness.  Progression to a complete rupture of the ligament. TREATMENT  Treatment initially involves resting from any activities that aggravate the symptoms, and the use of ice and medications to help reduce pain and inflammation. Your caregiver may recommend immobilizing the wrist for a period of time in order to reduce stress on the ligament and allow for healing. After  immobilization it is important to perform strengthening and stretching exercises to help regain strength and a full range of motion. These exercises may be completed at home or with a therapist. Surgery is not usually required for wrist sprains, unless the ligament has been ruptured (grade 3 sprain). MEDICATION   If pain medication is necessary, then nonsteroidal anti-inflammatory medications, such as aspirin and ibuprofen, or other minor pain relievers, such as acetaminophen, are often recommended.  Do not take pain medication for 7 days before surgery.  Prescription pain relievers may be given if deemed necessary by your caregiver. Use only as directed and only as much as you need. HEAT AND COLD  Cold treatment (icing) relieves pain and reduces inflammation. Cold treatment should be applied for 10 to 15 minutes every 2 to 3 hours for inflammation and pain and immediately after any activity that aggravates your symptoms. Use ice packs or massage the area with a piece of ice (ice massage).  Heat treatment may be used prior to performing the stretching and strengthening activities prescribed by your caregiver, physical therapist, or athletic trainer. Use a heat pack or soak your injury in warm water. SEEK MEDICAL CARE IF:  Treatment seems to offer no benefit, or the condition worsens.  Any medications produce adverse side effects. EXERCISES RANGE OF MOTION (ROM) AND STRETCHING EXERCISES - Wrist Sprain  These exercises may help you when beginning to rehabilitate your injury. Your symptoms may resolve with or without further involvement from your physician, physical therapist or athletic trainer. While completing these exercises, remember:   Restoring tissue flexibility helps normal motion to return to the joints. This allows healthier, less painful movement and activity.  An effective stretch should be held for at least 30 seconds.  A stretch should never be painful. You should only feel a  gentle lengthening or release in the stretched tissue. RANGE OF MOTION - Wrist Flexion, Active-Assisted  Extend your right / left elbow with your fingers pointing down.*  Gently pull the back of your hand towards you until you feel a gentle stretch on the top of your forearm.  Hold this position for __________ seconds. Repeat __________ times. Complete this exercise __________ times per day.  *If directed by your physician, physical therapist or athletic trainer, complete this stretch with your elbow bent rather than extended. RANGE OF MOTION - Wrist Extension, Active-Assisted  Extend your right / left elbow and turn your palm upwards.*  Gently pull your palm/fingertips back so your wrist extends and your fingers point more toward the ground.  You should feel a gentle stretch on the inside of your forearm.  Hold this position for __________ seconds. Repeat __________ times. Complete this exercise __________ times per day. *If directed by your physician, physical therapist or athletic trainer, complete this stretch with your elbow bent, rather than extended. RANGE OF MOTION - Supination, Active  Stand or sit with your elbows at your side. Bend your right / left elbow to 90 degrees.  Turn your palm upward until you feel a gentle stretch on the inside of your forearm.  Hold this position for  __________ seconds. Slowly release and return to the starting position. Repeat __________ times. Complete this stretch __________ times per day.  RANGE OF MOTION - Pronation, Active  Stand or sit with your elbows at your side. Bend your right / left elbow to 90 degrees.  Turn your palm downward until you feel a gentle stretch on the top of your forearm.  Hold this position for __________ seconds. Slowly release and return to the starting position. Repeat __________ times. Complete this stretch __________ times per day.  STRETCH - Wrist Flexion  Place the back of your right / left hand on a  tabletop leaving your elbow slightly bent. Your fingers should point away from your body.  Gently press the back of your hand down onto the table by straightening your elbow. You should feel a stretch on the top of your forearm.  Hold this position for __________ seconds. Repeat __________ times. Complete this stretch __________ times per day.  STRETCH - Wrist Extension  Place your right / left fingertips on a tabletop leaving your elbow slightly bent. Your fingers should point backwards.  Gently press your fingers and palm down onto the table by straightening your elbow. You should feel a stretch on the inside of your forearm.  Hold this position for __________ seconds. Repeat __________ times. Complete this stretch __________ times per day.  STRENGTHENING EXERCISES - Wrist Sprain These exercises may help you when beginning to rehabilitate your injury. They may resolve your symptoms with or without further involvement from your physician, physical therapist or athletic trainer. While completing these exercises, remember:   Muscles can gain both the endurance and the strength needed for everyday activities through controlled exercises.  Complete these exercises as instructed by your physician, physical therapist or athletic trainer. Progress with the resistance and repetition exercises only as your caregiver advises. STRENGTH - Wrist Flexors  Sit with your right / left forearm palm-up and fully supported. Your elbow should be resting below the height of your shoulder. Allow your wrist to extend over the edge of the surface.  Loosely holding a __________ weight or a piece of rubber exercise band/tubing, slowly curl your hand up toward your forearm.  Hold this position for __________ seconds. Slowly lower the wrist back to the starting position in a controlled manner. Repeat __________ times. Complete this exercise __________ times per day.  STRENGTH - Wrist Extensors  Sit with your right  / left forearm palm-down and fully supported. Your elbow should be resting below the height of your shoulder. Allow your wrist to extend over the edge of the surface.  Loosely holding a __________ weight or a piece of rubber exercise band/tubing, slowly curl your hand up toward your forearm.  Hold this position for __________ seconds. Slowly lower the wrist back to the starting position in a controlled manner. Repeat __________ times. Complete this exercise __________ times per day.  STRENGTH - Ulnar Deviators  Stand with a ____________________ weight in your right / left hand, or sit holding on to the rubber exercise band/tubing with your opposite arm supported.  Move your wrist so that your pinkie travels toward your forearm and your thumb moves away from your forearm.  Hold this position for __________ seconds and then slowly lower the wrist back to the starting position. Repeat __________ times. Complete this exercise __________ times per day STRENGTH - Radial Deviators  Stand with a ____________________ weight in your  right / left hand, or sit holding on to the rubber exercise  band/tubing with your arm supported.  Raise your hand upward in front of you or pull up on the rubber tubing.  Hold this position for __________ seconds and then slowly lower the wrist back to the starting position. Repeat __________ times. Complete this exercise __________ times per day. STRENGTH - Forearm Supinators  Sit with your right / left forearm supported on a table, keeping your elbow below shoulder height. Rest your hand over the edge, palm down.  Gently grip a hammer or a soup ladle.  Without moving your elbow, slowly turn your palm and hand upward to a "thumbs-up" position.  Hold this position for __________ seconds. Slowly return to the starting position. Repeat __________ times. Complete this exercise __________ times per day.  STRENGTH - Forearm Pronators  Sit with your right / left  forearm supported on a table, keeping your elbow below shoulder height. Rest your hand over the edge, palm up.  Gently grip a hammer or a soup ladle.  Without moving your elbow, slowly turn your palm and hand upward to a "thumbs-up" position.  Hold this position for __________ seconds. Slowly return to the starting position. Repeat __________ times. Complete this exercise __________ times per day.  STRENGTH - Grip  Grasp a tennis ball, a dense sponge, or a large, rolled sock in your hand.  Squeeze as hard as you can without increasing any pain.  Hold this position for __________ seconds. Release your grip slowly. Repeat __________ times. Complete this exercise __________ times per day.    This information is not intended to replace advice given to you by your health care provider. Make sure you discuss any questions you have with your health care provider.   Document Released: 06/24/2005 Document Revised: 03/15/2015 Document Reviewed: 10/06/2008 Elsevier Interactive Patient Education 2016 [employer].   Your exam and x-rays are consistent with muscle strain and tendinitis. It is likely due to overuse or repetitive use of your hand. Wear the wrist splint while working. Stretch as demonstrated. Take the prescription medicines as needed. Follow-up with Dr. Sabra Heck as needed.

## 2015-12-11 ENCOUNTER — Encounter: Payer: Self-pay | Admitting: Emergency Medicine

## 2015-12-11 ENCOUNTER — Emergency Department
Admission: EM | Admit: 2015-12-11 | Discharge: 2015-12-11 | Disposition: A | Discharge status: Home / Self Care | Payer: PRIVATE HEALTH INSURANCE | Attending: Emergency Medicine | Admitting: Emergency Medicine

## 2015-12-11 DIAGNOSIS — H9201 Otalgia, right ear: Secondary | ICD-10-CM | POA: Diagnosis present

## 2015-12-11 DIAGNOSIS — R0981 Nasal congestion: Secondary | ICD-10-CM | POA: Insufficient documentation

## 2015-12-11 DIAGNOSIS — Z792 Long term (current) use of antibiotics: Secondary | ICD-10-CM | POA: Diagnosis not present

## 2015-12-11 DIAGNOSIS — Z87891 Personal history of nicotine dependence: Secondary | ICD-10-CM | POA: Diagnosis not present

## 2015-12-11 DIAGNOSIS — I10 Essential (primary) hypertension: Secondary | ICD-10-CM | POA: Insufficient documentation

## 2015-12-11 DIAGNOSIS — H6503 Acute serous otitis media, bilateral: Secondary | ICD-10-CM | POA: Diagnosis not present

## 2015-12-11 DIAGNOSIS — Z79899 Other long term (current) drug therapy: Secondary | ICD-10-CM | POA: Diagnosis not present

## 2015-12-11 DIAGNOSIS — H8110 Benign paroxysmal vertigo, unspecified ear: Secondary | ICD-10-CM

## 2015-12-11 MED ORDER — AMOXICILLIN 875 MG PO TABS: 875.0000 mg | ORAL_TABLET | Freq: Two times a day (BID) | ORAL | Status: DC

## 2015-12-11 MED ORDER — ONDANSETRON 8 MG PO TBDP
8.0000 mg | ORAL_TABLET | Freq: Once | ORAL | Status: AC
Start: 2015-12-11 — End: 2015-12-11
  Administered 2015-12-11: 8 mg via ORAL
  Filled 2015-12-11: qty 1

## 2015-12-11 MED ORDER — FEXOFENADINE-PSEUDOEPHED ER 60-120 MG PO TB12: 1.0000 | ORAL_TABLET | Freq: Two times a day (BID) | ORAL | Status: DC

## 2015-12-11 MED ORDER — BENZONATATE 100 MG PO CAPS: 100.0000 mg | ORAL_CAPSULE | Freq: Three times a day (TID) | ORAL | Status: DC | PRN

## 2015-12-11 NOTE — ED Notes (Signed)
PA-C arrived to assess pt. While this RN was completing initial assessment. See PA-C note.

## 2015-12-11 NOTE — ED Notes (Signed)
Pt. Verbalizes understanding of d/c instructions, prescriptions, and follow-up care.VS stable. Pt. Out of the unit by wheelchair with RN in NAD at time of d/c.

## 2015-12-11 NOTE — ED Provider Notes (Signed)
[facility] Emergency Department Provider Note   ____________________________________________  Time seen: Approximately 3:35 PM  I have reviewed the triage vital signs and the nursing notes.   HISTORY  Chief Complaint Otalgia    HPI Sherry Hernandez is a 50 y.o. female patient complaining of right ear congestion with decreased hearing loss. Patient also complaining of nasal congestion and postnasal drainage. Patient also complaining of intermittent episodes of vertigo especially with bending over and abrupt standing. Patient states ear pain for 4 days. Vertigo onset was today at work. No palliative for these complaints. Patient rating her ear pain as a 10 over 10.  Past Medical History  Diagnosis Date  . Overactive bladder   . Dysuria   . Obesity   . HTN (hypertension)   . Stress incontinence   . Sensory urge incontinence   . Frequency     Patient Active Problem List   Diagnosis Date Noted  . Uncontrollable vomiting   . Intractable nausea and vomiting 07/22/2015    Past Surgical History  Procedure Laterality Date  . Abdominal hysterectomy    . Colonoscopy with propofol N/A 03/24/2015    Procedure: COLONOSCOPY WITH PROPOFOL;  Surgeon: Josefine Class, MD;  Location: [facility] ENDOSCOPY;  Service: Endoscopy;  Laterality: N/A;    Current Outpatient Rx  Name  Route  Sig  Dispense  Refill  . amoxicillin (AMOXIL) 875 MG tablet   Oral   Take 1 tablet (875 mg total) by mouth 2 (two) times daily.   20 tablet   0   . benzonatate (TESSALON PERLES) 100 MG capsule   Oral   Take 1 capsule (100 mg total) by mouth 3 (three) times daily as needed for cough.   15 capsule   0   . cyclobenzaprine (FLEXERIL) 5 MG tablet   Oral   Take 1 tablet (5 mg total) by mouth every 8 (eight) hours as needed for muscle spasms.   12 tablet   0   . EXPIRED: dicyclomine (BENTYL) 10 MG capsule   Oral   Take 1 capsule (10 mg total) by mouth 3 (three) times daily  before meals.   12 capsule   0   . fexofenadine-pseudoephedrine (ALLEGRA-D) 60-120 MG 12 hr tablet   Oral   Take 1 tablet by mouth 2 (two) times daily.   30 tablet   0   . imipramine (TOFRANIL) 25 MG tablet   Oral   Take 25 mg by mouth at bedtime.         Marland Kitchen lisinopril-hydrochlorothiazide (PRINZIDE,ZESTORETIC) 10-12.5 MG per tablet   Oral   Take 1 tablet by mouth daily.         . mirabegron ER (MYRBETRIQ) 50 MG TB24 tablet   Oral   Take 1 tablet (50 mg total) by mouth daily. Patient not taking: Reported on 03/24/2015   30 tablet      . mirabegron ER (MYRBETRIQ) 50 MG TB24 tablet   Oral   Take 1 tablet (50 mg total) by mouth daily. Patient not taking: Reported on 07/22/2015   30 tablet   0   . multivitamin-iron-minerals-folic acid (CENTRUM) chewable tablet   Oral   Chew 1 tablet by mouth daily.         . naproxen (EC NAPROSYN) 500 MG EC tablet   Oral   Take 1 tablet (500 mg total) by mouth 2 (two) times daily with a meal.   30 tablet   0   .  ondansetron (ZOFRAN) 4 MG tablet   Oral   Take 1 tablet (4 mg total) by mouth every 6 (six) hours as needed for nausea or vomiting.   15 tablet   0   . phenazopyridine (PYRIDIUM) 200 MG tablet   Oral   Take 1 tablet (200 mg total) by mouth 3 (three) times daily as needed for pain. Patient not taking: Reported on 03/24/2015   20 tablet   0     Allergies Review of patient's allergies indicates no known allergies.  Family History  Problem Relation Age of Onset  . Cancer Father   . Cancer Mother   . Breast cancer Mother 45  . Cancer Sister     Social History Social History  Substance Use Topics  . Smoking status: Former Smoker -- 7 years    Quit date: 09/20/2007  . Smokeless tobacco: None  . Alcohol Use: No    Review of Systems Constitutional: No fever/chills Eyes: No visual changes. ENT: Sore throat and right ear pain. Sinus congestion Cardiovascular: Denies chest pain. Respiratory: Denies shortness  of breath. Gastrointestinal: No abdominal pain.  No nausea, no vomiting.  No diarrhea.  No constipation. Genitourinary: Negative for dysuria. Musculoskeletal: Negative for back pain. Skin: Negative for rash. Neurological: Negative for headaches, focal weakness or numbness.    ____________________________________________   PHYSICAL EXAM:  VITAL SIGNS: ED Triage Vitals  Enc Vitals Group     BP 12/11/15 1526 116/56 mmHg     Pulse Rate 12/11/15 1526 100     Resp 12/11/15 1526 20     Temp 12/11/15 1526 98.6 F (37 C)     Temp Source 12/11/15 1526 Oral     SpO2 12/11/15 1526 99 %     Weight 12/11/15 1526 249 lb (112.946 kg)     Height 12/11/15 1526 5\' 6"  (1.676 m)     Head Cir --      Peak Flow --      Pain Score 12/11/15 1527 10     Pain Loc --      Pain Edu? --      Excl. in [place]? --     Constitutional: Alert and oriented. Well appearing and in no acute distress. Eyes: Conjunctivae are normal. PERRL. EOMI. Head: Atraumatic. Nose:Bilateral maxillary guarding. Edematous nasal turbinates. Mouth/Throat: Mucous membranes are moist.  Oropharynx non-erythematous. Postnasal drainage. EARS: Edematous erythematous right TM. Left TM is edematous. Neck: No stridor. No cervical spine tenderness to palpation. Hematological/Lymphatic/Immunilogical: No cervical lymphadenopathy. Cardiovascular: Normal rate, regular rhythm. Grossly normal heart sounds.  Good peripheral circulation. Respiratory: Normal respiratory effort.  No retractions. Lungs CTAB. Nonproductive cough Gastrointestinal: Soft and nontender. No distention. No abdominal bruits. No CVA tenderness. Musculoskeletal: No lower extremity tenderness nor edema.  No joint effusions. Neurologic:  Normal speech and language. No gross focal neurologic deficits are appreciated. There was no gait instability. Patient has a negative Romberg test.  Skin:  Skin is warm, dry and intact. No rash noted. Psychiatric: Mood and affect are normal.  Speech and behavior are normal.  ____________________________________________   LABS (all labs ordered are listed, but only abnormal results are displayed)  Labs Reviewed - No data to display ____________________________________________  EKG   ____________________________________________  RADIOLOGY   ____________________________________________   PROCEDURES  Procedure(s) performed:   Critical Care performed: No  ____________________________________________   INITIAL IMPRESSION / ASSESSMENT AND PLAN / ED COURSE  Pertinent labs & imaging results that were available during my care of the patient were  reviewed by me and considered in my medical decision making (see chart for details).  Sinus congestion, bilateral otitis media, and vertigo. Patient given discharge care instructions. Patient given a prescription for amoxicillin, Allegra-D, Tessalon Perles, patient given a work note and advised follow-up family doctor if no improvement in 3-5 days. ____________________________________________   FINAL CLINICAL IMPRESSION(S) / ED DIAGNOSES  Final diagnoses:  Congestion of paranasal sinus  Bilateral acute serous otitis media, recurrence not specified  Vertigo, benign positional, unspecified laterality      NEW MEDICATIONS STARTED DURING THIS VISIT:  New Prescriptions   AMOXICILLIN (AMOXIL) 875 MG TABLET    Take 1 tablet (875 mg total) by mouth 2 (two) times daily.   BENZONATATE (TESSALON PERLES) 100 MG CAPSULE    Take 1 capsule (100 mg total) by mouth 3 (three) times daily as needed for cough.   FEXOFENADINE-PSEUDOEPHEDRINE (ALLEGRA-D) 60-120 MG 12 HR TABLET    Take 1 tablet by mouth 2 (two) times daily.     Note:  This document was prepared using Dragon voice recognition software and may include unintentional dictation errors.    Sable Feil, PA-C 12/11/15 1600  Earleen Newport, MD 12/11/15 [phone]

## 2015-12-11 NOTE — ED Notes (Signed)
R earache and feeling of being stopped up since this am, dizziness began at same time.

## 2016-01-24 ENCOUNTER — Other Ambulatory Visit: Payer: Self-pay | Admitting: Family Medicine

## 2016-01-24 DIAGNOSIS — Z1231 Encounter for screening mammogram for malignant neoplasm of breast: Secondary | ICD-10-CM

## 2016-02-08 ENCOUNTER — Ambulatory Visit
Admission: RE | Admit: 2016-02-08 | Discharge: 2016-02-08 | Disposition: A | Discharge status: Home / Self Care | Payer: PRIVATE HEALTH INSURANCE | Source: Ambulatory Visit | Attending: Family Medicine | Admitting: Family Medicine

## 2016-02-08 DIAGNOSIS — Z1231 Encounter for screening mammogram for malignant neoplasm of breast: Secondary | ICD-10-CM | POA: Diagnosis not present

## 2016-02-08 DIAGNOSIS — R928 Other abnormal and inconclusive findings on diagnostic imaging of breast: Secondary | ICD-10-CM | POA: Insufficient documentation

## 2016-02-12 ENCOUNTER — Other Ambulatory Visit: Payer: Self-pay | Admitting: Family Medicine

## 2016-02-12 DIAGNOSIS — R928 Other abnormal and inconclusive findings on diagnostic imaging of breast: Secondary | ICD-10-CM

## 2016-02-23 ENCOUNTER — Other Ambulatory Visit: Payer: No Typology Code available for payment source

## 2016-02-23 ENCOUNTER — Ambulatory Visit: Payer: No Typology Code available for payment source

## 2016-02-28 ENCOUNTER — Ambulatory Visit
Admission: RE | Admit: 2016-02-28 | Discharge: 2016-02-28 | Disposition: A | Discharge status: Home / Self Care | Payer: PRIVATE HEALTH INSURANCE | Source: Ambulatory Visit | Attending: Family Medicine | Admitting: Family Medicine

## 2016-02-28 DIAGNOSIS — N6001 Solitary cyst of right breast: Secondary | ICD-10-CM | POA: Insufficient documentation

## 2016-02-28 DIAGNOSIS — N63 Unspecified lump in breast: Secondary | ICD-10-CM | POA: Diagnosis present

## 2016-02-28 DIAGNOSIS — R928 Other abnormal and inconclusive findings on diagnostic imaging of breast: Secondary | ICD-10-CM | POA: Diagnosis present

## 2016-08-20 ENCOUNTER — Other Ambulatory Visit: Payer: Self-pay | Admitting: Family Medicine

## 2016-08-20 DIAGNOSIS — R928 Other abnormal and inconclusive findings on diagnostic imaging of breast: Secondary | ICD-10-CM

## 2016-12-13 ENCOUNTER — Ambulatory Visit
Admission: RE | Admit: 2016-12-13 | Discharge: 2016-12-13 | Disposition: A | Discharge status: Home / Self Care | Payer: PRIVATE HEALTH INSURANCE | Source: Ambulatory Visit | Attending: Family Medicine | Admitting: Family Medicine

## 2016-12-13 DIAGNOSIS — R928 Other abnormal and inconclusive findings on diagnostic imaging of breast: Secondary | ICD-10-CM | POA: Insufficient documentation

## 2017-01-13 ENCOUNTER — Emergency Department: Payer: PRIVATE HEALTH INSURANCE

## 2017-01-13 ENCOUNTER — Encounter: Payer: Self-pay | Admitting: *Deleted

## 2017-01-13 DIAGNOSIS — I10 Essential (primary) hypertension: Secondary | ICD-10-CM | POA: Diagnosis not present

## 2017-01-13 DIAGNOSIS — R600 Localized edema: Secondary | ICD-10-CM | POA: Diagnosis not present

## 2017-01-13 DIAGNOSIS — L03119 Cellulitis of unspecified part of limb: Secondary | ICD-10-CM | POA: Diagnosis not present

## 2017-01-13 DIAGNOSIS — Z79899 Other long term (current) drug therapy: Secondary | ICD-10-CM | POA: Insufficient documentation

## 2017-01-13 DIAGNOSIS — R2243 Localized swelling, mass and lump, lower limb, bilateral: Secondary | ICD-10-CM | POA: Diagnosis present

## 2017-01-13 DIAGNOSIS — Z87891 Personal history of nicotine dependence: Secondary | ICD-10-CM | POA: Insufficient documentation

## 2017-01-13 LAB — CBC WITH DIFFERENTIAL/PLATELET
Basophils Absolute: 0 10*3/uL (ref 0–0.1)
Basophils Relative: 1 %
EOS PCT: 2 %
Eosinophils Absolute: 0.1 10*3/uL (ref 0–0.7)
HEMATOCRIT: 37.4 % (ref 35.0–47.0)
Hemoglobin: 12.9 g/dL (ref 12.0–16.0)
LYMPHS ABS: 1.9 10*3/uL (ref 1.0–3.6)
LYMPHS PCT: 29 %
MCH: 29.7 pg (ref 26.0–34.0)
MCHC: 34.4 g/dL (ref 32.0–36.0)
MCV: 86.2 fL (ref 80.0–100.0)
MONOS PCT: 7 %
Monocytes Absolute: 0.4 10*3/uL (ref 0.2–0.9)
NEUTROS ABS: 4.1 10*3/uL (ref 1.4–6.5)
Neutrophils Relative %: 61 %
PLATELETS: 202 10*3/uL (ref 150–440)
RBC: 4.34 MIL/uL (ref 3.80–5.20)
RDW: 14.7 % — AB (ref 11.5–14.5)
WBC: 6.6 10*3/uL (ref 3.6–11.0)

## 2017-01-13 NOTE — ED Triage Notes (Signed)
Pt complains of bilateral leg swelling, pt has a red raised rash to bilateral legs and face, pt complains of burning to legs, pt denies chest pain and any other symptoms

## 2017-01-14 ENCOUNTER — Emergency Department: Payer: PRIVATE HEALTH INSURANCE

## 2017-01-14 ENCOUNTER — Emergency Department
Admission: EM | Admit: 2017-01-14 | Discharge: 2017-01-14 | Disposition: A | Discharge status: Home / Self Care | Payer: PRIVATE HEALTH INSURANCE | Attending: Emergency Medicine | Admitting: Emergency Medicine

## 2017-01-14 DIAGNOSIS — R609 Edema, unspecified: Secondary | ICD-10-CM

## 2017-01-14 DIAGNOSIS — R600 Localized edema: Secondary | ICD-10-CM

## 2017-01-14 DIAGNOSIS — L03119 Cellulitis of unspecified part of limb: Secondary | ICD-10-CM

## 2017-01-14 LAB — HEPATIC FUNCTION PANEL
ALBUMIN: 3.5 g/dL (ref 3.5–5.0)
ALK PHOS: 70 U/L (ref 38–126)
ALT: 35 U/L (ref 14–54)
AST: 26 U/L (ref 15–41)
Bilirubin, Direct: <0.1 mg/dL — ABNORMAL LOW (ref 0.1–0.5)
TOTAL PROTEIN: 6.5 g/dL (ref 6.5–8.1)
Total Bilirubin: 0.5 mg/dL (ref 0.3–1.2)

## 2017-01-14 LAB — BASIC METABOLIC PANEL
ANION GAP: 7 (ref 5–15)
BUN: 24 mg/dL — ABNORMAL HIGH (ref 6–20)
CHLORIDE: 105 mmol/L (ref 101–111)
CO2: 28 mmol/L (ref 22–32)
CREATININE: 0.87 mg/dL (ref 0.44–1.00)
Calcium: 9.3 mg/dL (ref 8.9–10.3)
GFR calc non Af Amer: >60 mL/min (ref >60)
Glucose, Bld: 98 mg/dL (ref 65–99)
POTASSIUM: 3.5 mmol/L (ref 3.5–5.1)
SODIUM: 140 mmol/L (ref 135–145)

## 2017-01-14 LAB — TROPONIN I

## 2017-01-14 LAB — BRAIN NATRIURETIC PEPTIDE: B Natriuretic Peptide: 100 pg/mL (ref 0.0–100.0)

## 2017-01-14 MED ORDER — CLINDAMYCIN HCL 300 MG PO CAPS: 300.0000 mg | ORAL_CAPSULE | Freq: Three times a day (TID) | ORAL | 0 refills | Status: AC

## 2017-01-14 MED ORDER — OXYCODONE-ACETAMINOPHEN 5-325 MG PO TABS
1.0000 | ORAL_TABLET | Freq: Once | ORAL | Status: AC
  Administered 2017-01-14: 1 via ORAL
  Filled 2017-01-14: qty 1

## 2017-01-14 MED ORDER — CLINDAMYCIN HCL 150 MG PO CAPS
300.0000 mg | ORAL_CAPSULE | Freq: Once | ORAL | Status: AC
  Administered 2017-01-14: 300 mg via ORAL
  Filled 2017-01-14: qty 2

## 2017-01-14 MED ORDER — FUROSEMIDE 40 MG PO TABS
40.0000 mg | ORAL_TABLET | Freq: Once | ORAL | Status: AC
  Administered 2017-01-14: 40 mg via ORAL
  Filled 2017-01-14: qty 1

## 2017-01-14 MED ORDER — FUROSEMIDE 20 MG PO TABS: 20.0000 mg | ORAL_TABLET | Freq: Every day | ORAL | 0 refills | Status: DC

## 2017-01-14 NOTE — ED Notes (Signed)
Patient made aware she is not to drive home after taking pain medication. Patient verbalizes understanding and states, "I have a ride. They are on their way."

## 2017-01-14 NOTE — Discharge Instructions (Signed)
Please take your antibiotic and fluid pill. Please follow-up with your primary care physician. If the symptoms are not improving within the next 3-5 days or having any other concerns or symptoms. Return to the emergency department for further evaluation.

## 2017-01-14 NOTE — ED Provider Notes (Signed)
[facility] Emergency Department Provider Note   ____________________________________________   First MD Initiated Contact with Patient 01/14/17 [id number]     (approximate)  I have reviewed the triage vital signs and the nursing notes.   HISTORY  Chief Complaint Leg Swelling    HPI Sherry Hernandez is a 51 y.o. female who comes into the hospital with redness in her legs and leg swelling. She reports that the symptoms started yesterday. She's never had anything like this happen before. She reports that both of her legs are painful and her toes feel numb. She denies any injury or trauma. She didn't take anything for pain but decided to just come in and get checked out. The patient reports pain is a 10 out of 10 in intensity. Her feet are very swollen. She denies any shortness of breath and has some dizziness although she states it started after she is been here for a while. The patient denies any chest pain. The patient is here today for evaluation of the symptoms.Nothing makes her pain better or worse.   Past Medical History:  Diagnosis Date  . Dysuria   . Frequency   . HTN (hypertension)   . Obesity   . Overactive bladder   . Sensory urge incontinence   . Stress incontinence     Patient Active Problem List   Diagnosis Date Noted  . Uncontrollable vomiting   . Intractable nausea and vomiting 07/22/2015    Past Surgical History:  Procedure Laterality Date  . ABDOMINAL HYSTERECTOMY    . COLONOSCOPY WITH PROPOFOL N/A 03/24/2015   Procedure: COLONOSCOPY WITH PROPOFOL;  Surgeon: Josefine Class, MD;  Location: [facility] ENDOSCOPY;  Service: Endoscopy;  Laterality: N/A;    Prior to Admission medications   Medication Sig Start Date End Date Taking? Authorizing Provider  amoxicillin (AMOXIL) 875 MG tablet Take 1 tablet (875 mg total) by mouth 2 (two) times daily. 12/11/15   Sable Feil, PA-C  benzonatate (TESSALON PERLES) 100 MG capsule Take 1 capsule  (100 mg total) by mouth 3 (three) times daily as needed for cough. 12/11/15   Sable Feil, PA-C  clindamycin (CLEOCIN) 300 MG capsule Take 1 capsule (300 mg total) by mouth 3 (three) times daily. 01/14/17 01/24/17  Loney Hering, MD  cyclobenzaprine (FLEXERIL) 5 MG tablet Take 1 tablet (5 mg total) by mouth every 8 (eight) hours as needed for muscle spasms. 11/05/15   Menshew, Dannielle Karvonen, PA-C  dicyclomine (BENTYL) 10 MG capsule Take 1 capsule (10 mg total) by mouth 3 (three) times daily before meals. 07/21/15 08/04/15  Menshew, Dannielle Karvonen, PA-C  fexofenadine-pseudoephedrine (ALLEGRA-D) 60-120 MG 12 hr tablet Take 1 tablet by mouth 2 (two) times daily. 12/11/15   Sable Feil, PA-C  furosemide (LASIX) 20 MG tablet Take 1 tablet (20 mg total) by mouth daily. 01/14/17 01/14/18  Loney Hering, MD  imipramine (TOFRANIL) 25 MG tablet Take 25 mg by mouth at bedtime.    [provider]  lisinopril-hydrochlorothiazide (PRINZIDE,ZESTORETIC) 10-12.5 MG per tablet Take 1 tablet by mouth daily.    [provider]  mirabegron ER (MYRBETRIQ) 50 MG TB24 tablet Take 1 tablet (50 mg total) by mouth daily. Patient not taking: Reported on 03/24/2015 01/05/15   Ardis Hughs, MD  mirabegron ER (MYRBETRIQ) 50 MG TB24 tablet Take 1 tablet (50 mg total) by mouth daily. Patient not taking: Reported on 07/22/2015 01/30/15   Zara Council A, PA-C  multivitamin-iron-minerals-folic acid (  CENTRUM) chewable tablet Chew 1 tablet by mouth daily.    [provider]  naproxen (EC NAPROSYN) 500 MG EC tablet Take 1 tablet (500 mg total) by mouth 2 (two) times daily with a meal. 11/05/15   Menshew, Dannielle Karvonen, PA-C  ondansetron (ZOFRAN) 4 MG tablet Take 1 tablet (4 mg total) by mouth every 6 (six) hours as needed for nausea or vomiting. 07/21/15   Menshew, Dannielle Karvonen, PA-C    Allergies Patient has no known allergies.  Family History  Problem Relation Age of Onset  . Cancer Father    . Cancer Mother   . Breast cancer Mother 24  . Cancer Sister     Social History Social History  Substance Use Topics  . Smoking status: Former Smoker    Years: 7.00    Quit date: 09/20/2007  . Smokeless tobacco: Not on file  . Alcohol use No    Review of Systems  Constitutional: No fever/chills Eyes: No visual changes. ENT: No sore throat. Cardiovascular: Denies chest pain. Respiratory: Denies shortness of breath. Gastrointestinal: No abdominal pain.  No nausea, no vomiting.  No diarrhea.  No constipation. Genitourinary: Negative for dysuria. Musculoskeletal: Negative for back pain. Skin: Erythema to bilateral lower extremities Neurological: Negative for headaches, focal weakness or numbness. Lymph: Bilateral leg swelling  ____________________________________________   PHYSICAL EXAM:  VITAL SIGNS: ED Triage Vitals  Enc Vitals Group     BP 01/13/17 2251 (!) 200/68     Pulse Rate 01/13/17 2251 79     Resp 01/13/17 2251 20     Temp 01/13/17 2251 98.9 F (37.2 C)     Temp Source 01/13/17 2251 Oral     SpO2 01/13/17 2251 97 %     Weight 01/13/17 2252 260 lb (117.9 kg)     Height 01/13/17 2252 5\' 6"  (1.676 m)     Head Circumference --      Peak Flow --      Pain Score 01/13/17 2251 8     Pain Loc --      Pain Edu? --      Excl. in [place]? --     Constitutional: Alert and oriented. Well appearing and in Mild distress. Eyes: Conjunctivae are normal. PERRL. EOMI. Head: Atraumatic. Nose: No congestion/rhinnorhea. Mouth/Throat: Mucous membranes are moist.  Oropharynx non-erythematous. Cardiovascular: Normal rate, regular rhythm. Grossly normal heart sounds.  Good peripheral circulation. Respiratory: Normal respiratory effort.  No retractions. Lungs CTAB. Gastrointestinal: Soft and nontender. No distention. Positive bowel sounds Musculoskeletal: Bilateral lower extremity edema with some mild tenderness to palpation of the feet up to the knees. Neurologic:  Normal  speech and language.  Skin:  Skin is warm, dry and intact. Bilateral lower extremity erythema with some mildly increased warmth and some mild tenderness to palpation Psychiatric: Mood and affect are normal.   ____________________________________________   LABS (all labs ordered are listed, but only abnormal results are displayed)  Labs Reviewed  BASIC METABOLIC PANEL - Abnormal; Notable for the following:       Result Value   BUN 24 (*)    All other components within normal limits  HEPATIC FUNCTION PANEL - Abnormal; Notable for the following:    Bilirubin, Direct <0.1 (*)    All other components within normal limits  CBC WITH DIFFERENTIAL/PLATELET - Abnormal; Notable for the following:    RDW 14.7 (*)    All other components within normal limits  TROPONIN I  BRAIN NATRIURETIC PEPTIDE  URINALYSIS,  COMPLETE (UACMP) WITH MICROSCOPIC   ____________________________________________  EKG  ED ECG REPORT I, Loney Hering, the attending physician, personally viewed and interpreted this ECG.   Date: 01/13/2017  EKG Time: 2300  Rate: 83  Rhythm: normal sinus rhythm  Axis: normal  Intervals:none  ST&T Change: none  ____________________________________________  RADIOLOGY  Dg Chest 2 View  Result Date: 01/13/2017 CLINICAL DATA:  Shortness of breath. Bilateral leg swelling with rash. EXAM: CHEST  2 VIEW COMPARISON:  Radiograph 04/08/2013 FINDINGS: The cardiomediastinal contours are unchanged, borderline cardiomegaly. Mild peribronchial thickening. Pulmonary vasculature is normal. No consolidation, pleural effusion, or pneumothorax. No acute osseous abnormalities are seen. IMPRESSION: Mild peribronchial thickening with borderline cardiomegaly. Electronically Signed   By: Jeb Levering M.D.   On: 01/13/2017 23:21   US Venous Img Lower Bilateral  Result Date: 01/14/2017 CLINICAL DATA:  Initial evaluation for lower leg swelling. EXAM: BILATERAL LOWER EXTREMITY VENOUS DOPPLER  ULTRASOUND TECHNIQUE: Gray-scale sonography with graded compression, as well as color Doppler and duplex ultrasound were performed to evaluate the lower extremity deep venous systems from the level of the common femoral vein and including the common femoral, femoral, profunda femoral, popliteal and calf veins including the posterior tibial, peroneal and gastrocnemius veins when visible. The superficial great saphenous vein was also interrogated. Spectral Doppler was utilized to evaluate flow at rest and with distal augmentation maneuvers in the common femoral, femoral and popliteal veins. COMPARISON:  None. FINDINGS: RIGHT LOWER EXTREMITY Common Femoral Vein: No evidence of thrombus. Normal compressibility, respiratory phasicity and response to augmentation. Saphenofemoral Junction: No evidence of thrombus. Normal compressibility and flow on color Doppler imaging. Profunda Femoral Vein: No evidence of thrombus. Normal compressibility and flow on color Doppler imaging. Femoral Vein: No evidence of thrombus. Normal compressibility, respiratory phasicity and response to augmentation. Popliteal Vein: No evidence of thrombus. Normal compressibility, respiratory phasicity and response to augmentation. Calf Veins: No evidence of thrombus. Normal compressibility and flow on color Doppler imaging. Superficial Great Saphenous Vein: No evidence of thrombus. Normal compressibility and flow on color Doppler imaging. Venous Reflux:  None. Other Findings:  None. LEFT LOWER EXTREMITY Common Femoral Vein: No evidence of thrombus. Normal compressibility, respiratory phasicity and response to augmentation. Saphenofemoral Junction: No evidence of thrombus. Normal compressibility and flow on color Doppler imaging. Profunda Femoral Vein: No evidence of thrombus. Normal compressibility and flow on color Doppler imaging. Femoral Vein: No evidence of thrombus. Normal compressibility, respiratory phasicity and response to augmentation.  Popliteal Vein: No evidence of thrombus. Normal compressibility, respiratory phasicity and response to augmentation. Calf Veins: No evidence of thrombus. Normal compressibility and flow on color Doppler imaging. Superficial Great Saphenous Vein: No evidence of thrombus. Normal compressibility and flow on color Doppler imaging. Venous Reflux:  None. Other Findings:  Subcutaneous edema within the bilateral calfs. IMPRESSION: 1. No evidence of DVT within either lower extremity. 2. Subcutaneous soft tissue edema within the bilateral calfs. Electronically Signed   By: [name] M.D.   On: 01/14/2017 06:49    ____________________________________________   PROCEDURES  Procedure(s) performed: None  Procedures  Critical Care performed: No  ____________________________________________   INITIAL IMPRESSION / ASSESSMENT AND PLAN / ED COURSE  Pertinent labs & imaging results that were available during my care of the patient were reviewed by me and considered in my medical decision making (see chart for details).  This is a 51 year old female who comes into the hospital today with some leg swelling and erythema. We'll check some blood work which was  unremarkable. The patient's BNP is normal. The patient did receive an ultrasound of her bilateral lower extremities as well as a chest x-ray and it did not show any DVT. She also has no pulmonary edema. She has some borderline cardiomegaly but again has no shortness of breath or chest pain. I feel that the patient has some cellulitis. I will give the patient some clindamycin orally as well as a dose of Lasix. I will discharge the patient and encouraged her to follow up with her primary care physician. She should return in about 3 days if her symptoms are not better or worsening.      ____________________________________________   FINAL CLINICAL IMPRESSION(S) / ED DIAGNOSES  Final diagnoses:  Cellulitis of lower extremity, unspecified  laterality  Peripheral edema      NEW MEDICATIONS STARTED DURING THIS VISIT:  Discharge Medication List as of 01/14/2017  7:10 AM    START taking these medications   Details  clindamycin (CLEOCIN) 300 MG capsule Take 1 capsule (300 mg total) by mouth 3 (three) times daily., Starting Tue 01/14/2017, Until Fri 01/24/2017, Print    furosemide (LASIX) 20 MG tablet Take 1 tablet (20 mg total) by mouth daily., Starting Tue 01/14/2017, Until Wed 01/14/2018, Print         Note:  This document was prepared using Dragon voice recognition software and may include unintentional dictation errors.    Loney Hering, MD 01/14/17 [id number]

## 2017-01-14 NOTE — ED Notes (Signed)
Pt sitting in waiting room, no change in symptoms

## 2017-01-14 NOTE — ED Notes (Signed)
Pt sitting in waiting room with feet elevated

## 2017-05-01 IMAGING — MG MM DIGITAL SCREENING BILAT W/ CAD
6 series · 6 of 6 positions shown · non-contrast
Comparison: Previous exam(s).

ACR Breast Density Category a: The breast tissue is almost entirely
fatty.

CLINICAL DATA: Screening.

EXAM:
DIGITAL SCREENING BILATERAL MAMMOGRAM WITH CAD

[R CC (1 of 2)]
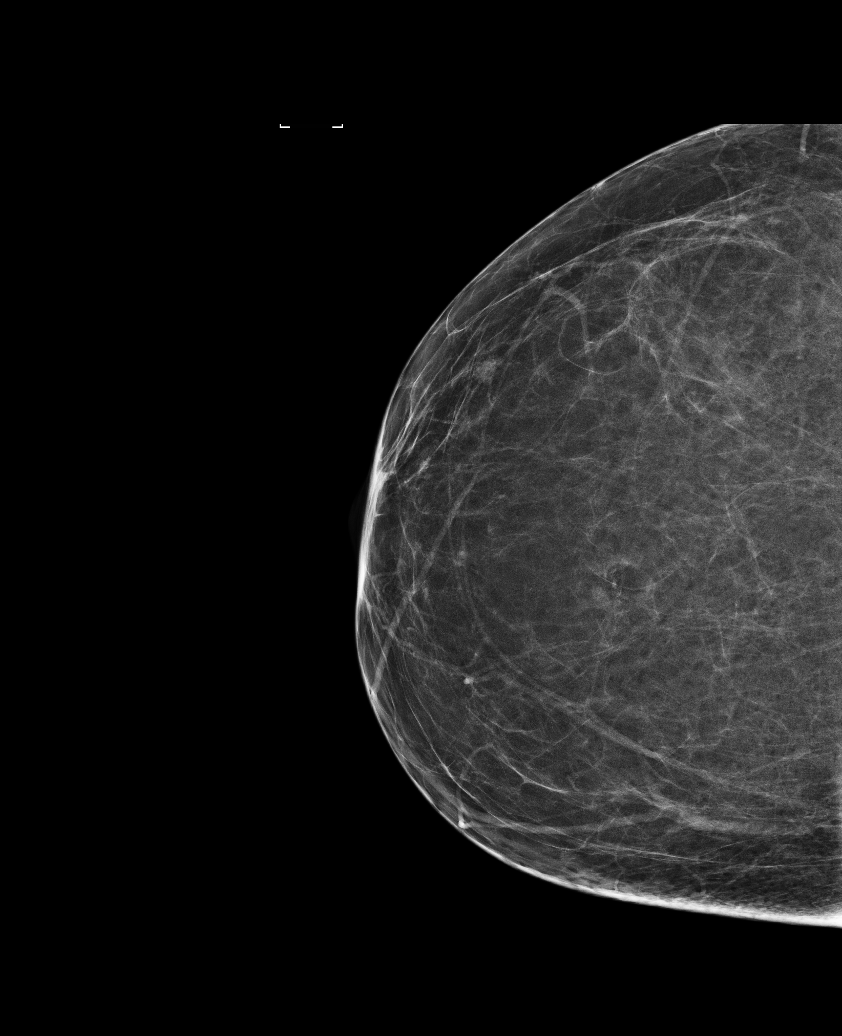

[R MLO (1 of 2)]
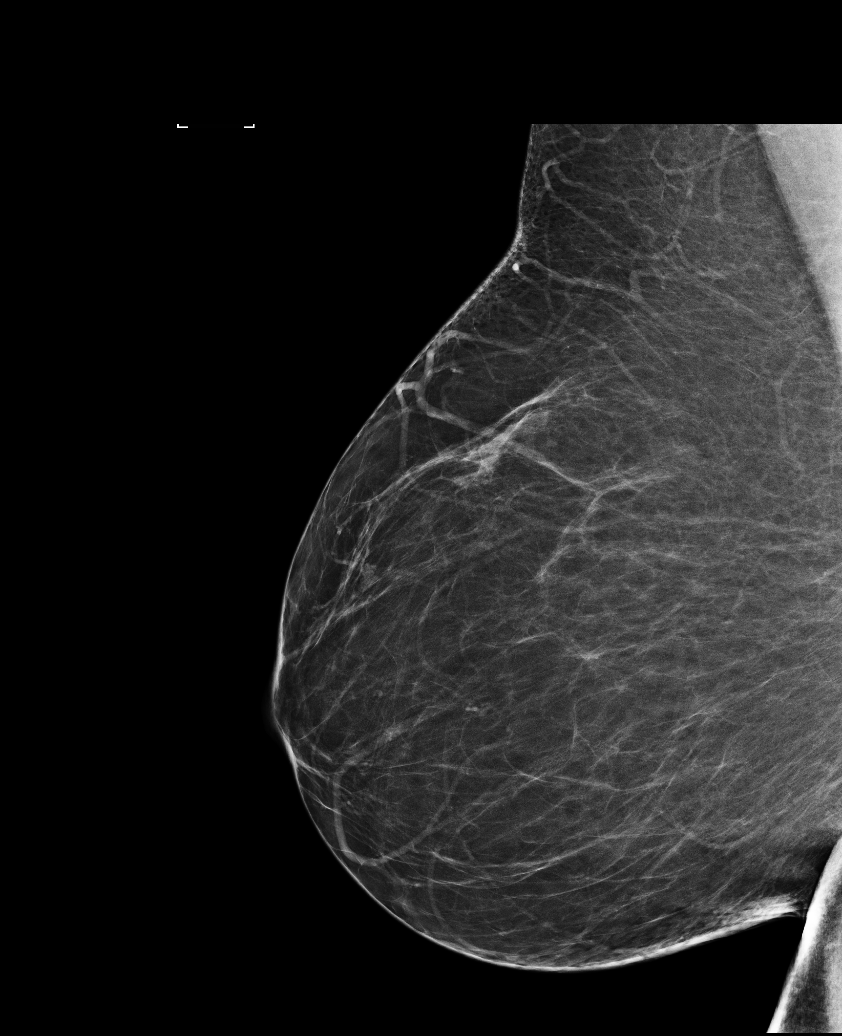

[L MLO]
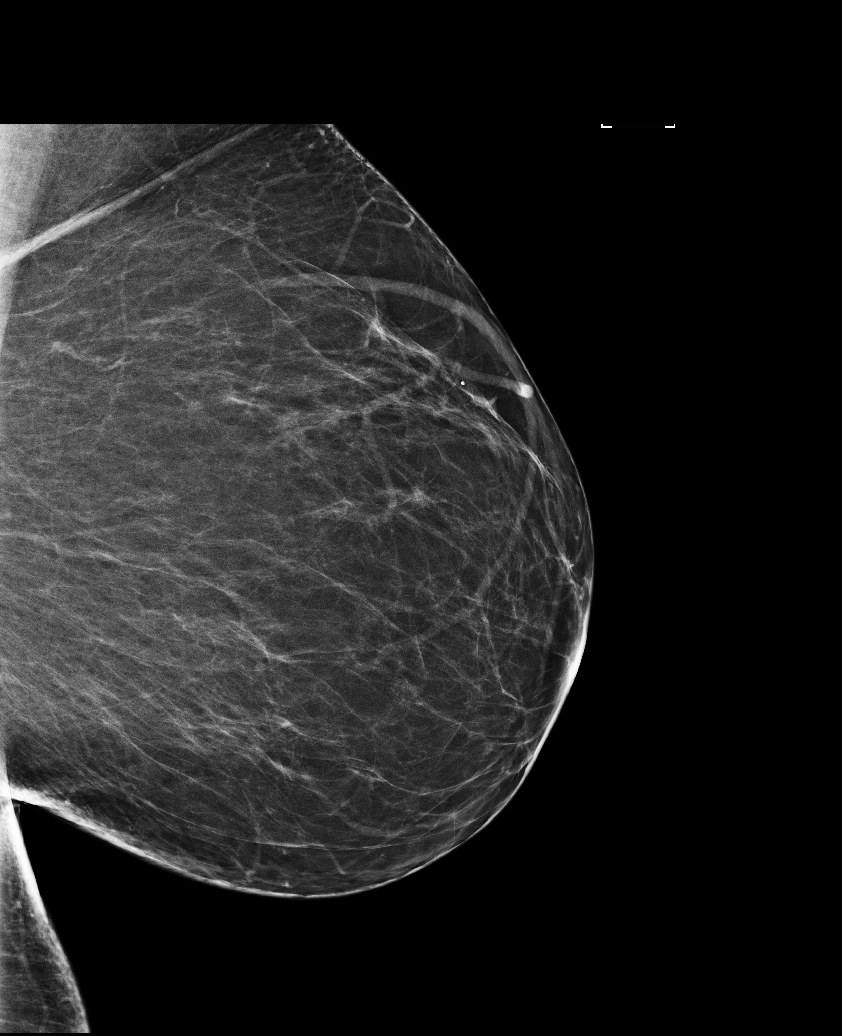

[L CC]
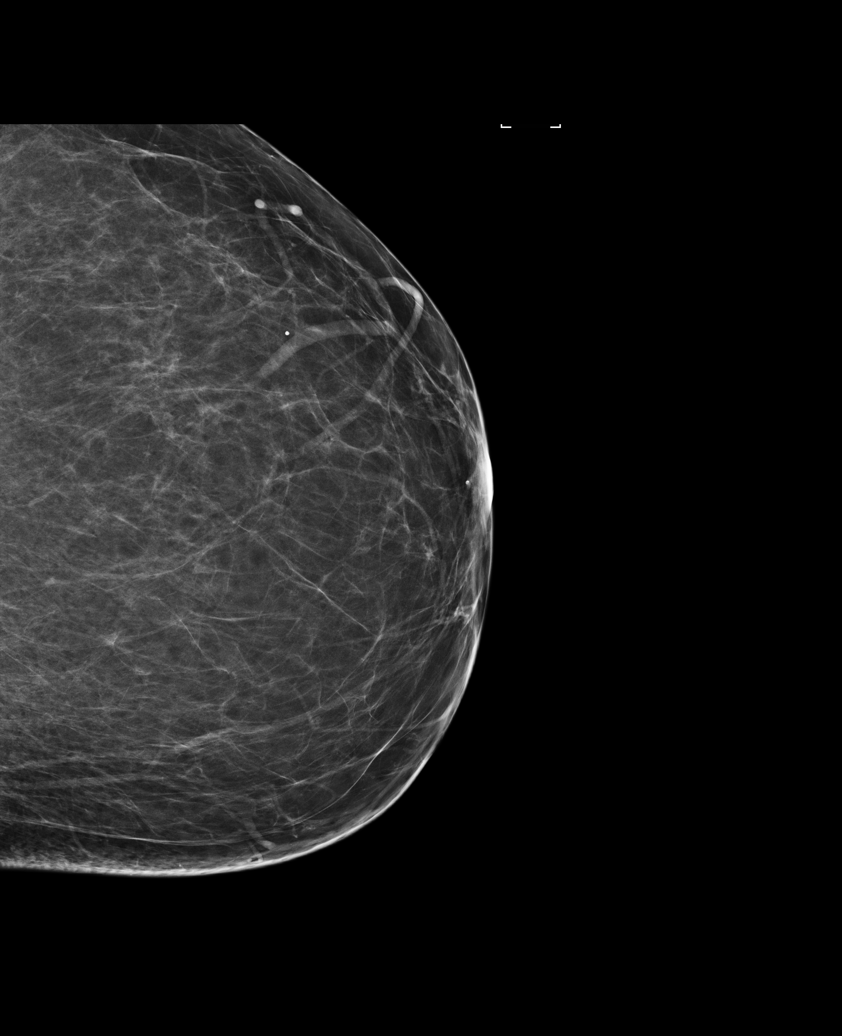

[R CC (2 of 2)]
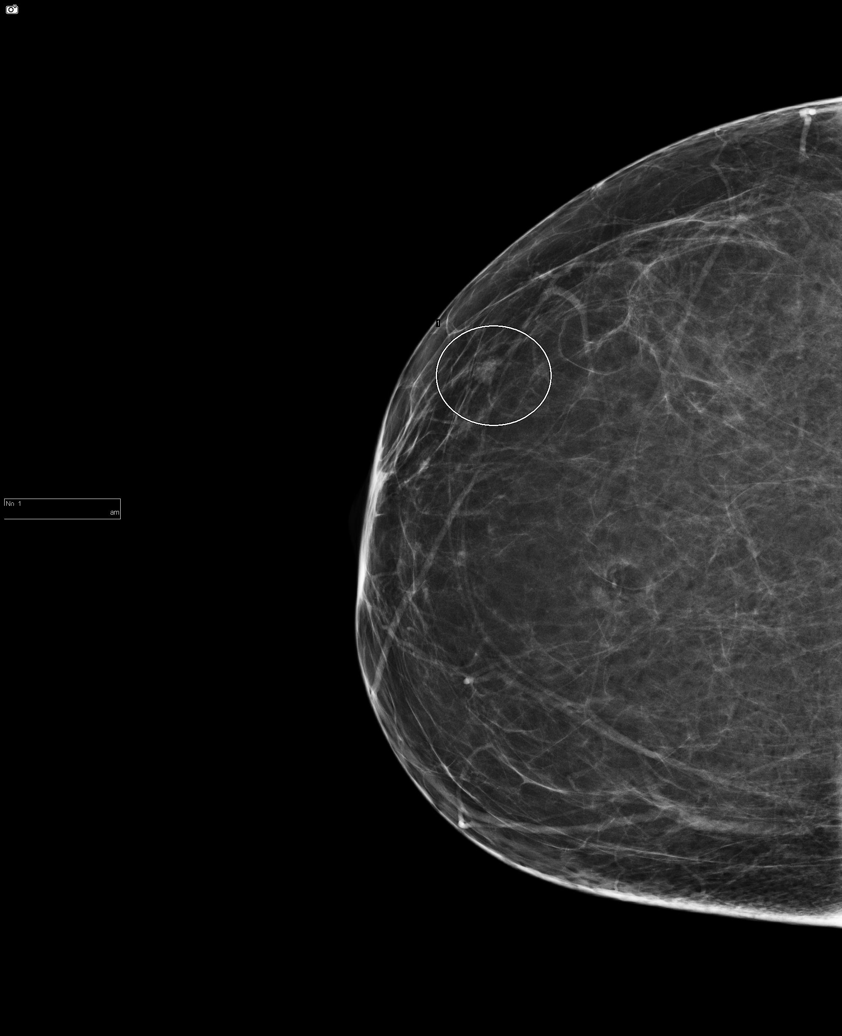

[R MLO (2 of 2)]
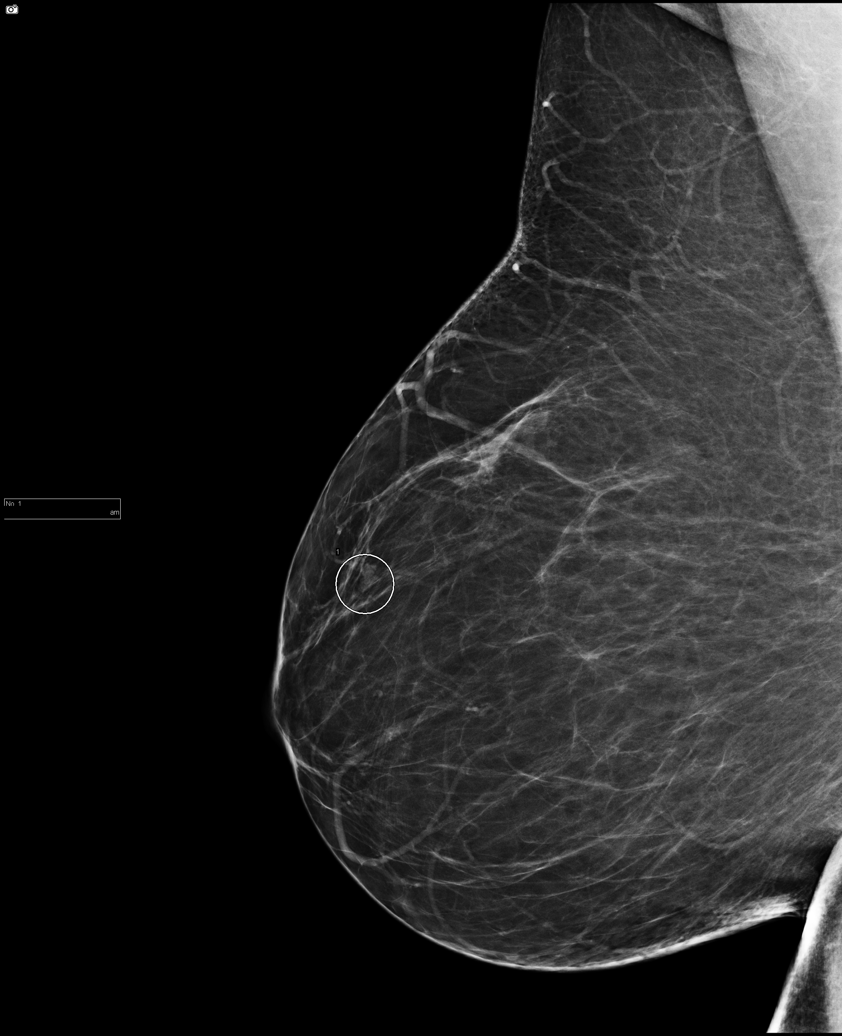

[6 of 6 positions shown; findings below may reference images not displayed]

FINDINGS: In the right breast, a possible mass warrants further evaluation. In
the left breast, no findings suspicious for malignancy. Images were
processed with CAD.
IMPRESSION: Further evaluation is suggested for possible mass in the right
breast.

RECOMMENDATION:
Diagnostic mammogram and possibly ultrasound of the right breast.
(Code:[id number]-[id number])

The patient will be contacted regarding the findings, and additional
imaging will be scheduled.

BI-RADS CATEGORY  0: Incomplete. Need additional imaging evaluation
and/or prior mammograms for comparison.

## 2017-07-10 ENCOUNTER — Emergency Department: Payer: Self-pay

## 2017-07-10 ENCOUNTER — Encounter: Payer: Self-pay | Admitting: Emergency Medicine

## 2017-07-10 ENCOUNTER — Emergency Department
Admission: EM | Admit: 2017-07-10 | Discharge: 2017-07-10 | Disposition: A | Discharge status: Home / Self Care | Payer: Self-pay | Attending: Emergency Medicine | Admitting: Emergency Medicine

## 2017-07-10 DIAGNOSIS — Z87891 Personal history of nicotine dependence: Secondary | ICD-10-CM | POA: Insufficient documentation

## 2017-07-10 DIAGNOSIS — I10 Essential (primary) hypertension: Secondary | ICD-10-CM | POA: Insufficient documentation

## 2017-07-10 DIAGNOSIS — R51 Headache: Secondary | ICD-10-CM | POA: Insufficient documentation

## 2017-07-10 DIAGNOSIS — R519 Headache, unspecified: Secondary | ICD-10-CM

## 2017-07-10 LAB — CBC WITH DIFFERENTIAL/PLATELET
Basophils Absolute: 0.1 10*3/uL (ref 0–0.1)
Basophils Relative: 1 %
EOS PCT: 1 %
Eosinophils Absolute: 0.1 10*3/uL (ref 0–0.7)
HEMATOCRIT: 41.3 % (ref 35.0–47.0)
Hemoglobin: 13.9 g/dL (ref 12.0–16.0)
LYMPHS ABS: 1.6 10*3/uL (ref 1.0–3.6)
LYMPHS PCT: 22 %
MCH: 29.6 pg (ref 26.0–34.0)
MCHC: 33.6 g/dL (ref 32.0–36.0)
MCV: 88.1 fL (ref 80.0–100.0)
MONO ABS: 0.5 10*3/uL (ref 0.2–0.9)
MONOS PCT: 7 %
NEUTROS ABS: 5.1 10*3/uL (ref 1.4–6.5)
Neutrophils Relative %: 69 %
PLATELETS: 195 10*3/uL (ref 150–440)
RBC: 4.69 MIL/uL (ref 3.80–5.20)
RDW: 14.3 % (ref 11.5–14.5)
WBC: 7.4 10*3/uL (ref 3.6–11.0)

## 2017-07-10 LAB — COMPREHENSIVE METABOLIC PANEL
ALT: 20 U/L (ref 14–54)
AST: 21 U/L (ref 15–41)
Albumin: 3.3 g/dL — ABNORMAL LOW (ref 3.5–5.0)
Alkaline Phosphatase: 73 U/L (ref 38–126)
Anion gap: 6 (ref 5–15)
BILIRUBIN TOTAL: 0.7 mg/dL (ref 0.3–1.2)
BUN: 19 mg/dL (ref 6–20)
CO2: 28 mmol/L (ref 22–32)
Calcium: 8.7 mg/dL — ABNORMAL LOW (ref 8.9–10.3)
Chloride: 106 mmol/L (ref 101–111)
Creatinine, Ser: 0.92 mg/dL (ref 0.44–1.00)
Glucose, Bld: 107 mg/dL — ABNORMAL HIGH (ref 65–99)
POTASSIUM: 3.8 mmol/L (ref 3.5–5.1)
Sodium: 140 mmol/L (ref 135–145)
TOTAL PROTEIN: 5.8 g/dL — AB (ref 6.5–8.1)

## 2017-07-10 MED ORDER — METOCLOPRAMIDE HCL 5 MG/ML IJ SOLN
10.0000 mg | Freq: Once | INTRAMUSCULAR | Status: AC
  Administered 2017-07-10: 10 mg via INTRAVENOUS
  Filled 2017-07-10: qty 2

## 2017-07-10 MED ORDER — MORPHINE SULFATE (PF) 4 MG/ML IV SOLN
4.0000 mg | Freq: Once | INTRAVENOUS | Status: AC
Start: 2017-07-10 — End: 2017-07-10
  Administered 2017-07-10: 4 mg via INTRAVENOUS
  Filled 2017-07-10: qty 1

## 2017-07-10 MED ORDER — SODIUM CHLORIDE 0.9 % IV SOLN
Freq: Once | INTRAVENOUS | Status: AC
  Administered 2017-07-10: 10:00:00 via INTRAVENOUS

## 2017-07-10 MED ORDER — KETOROLAC TROMETHAMINE 30 MG/ML IJ SOLN
30.0000 mg | Freq: Once | INTRAMUSCULAR | Status: AC
  Administered 2017-07-10: 30 mg via INTRAVENOUS
  Filled 2017-07-10: qty 1

## 2017-07-10 MED ORDER — ZOLPIDEM TARTRATE 10 MG PO TABS: 10.0000 mg | ORAL_TABLET | Freq: Every evening | ORAL | 0 refills | Status: DC | PRN

## 2017-07-10 MED ORDER — LORAZEPAM 2 MG/ML IJ SOLN
0.5000 mg | Freq: Once | INTRAMUSCULAR | Status: AC
  Administered 2017-07-10: 0.5 mg via INTRAVENOUS
  Filled 2017-07-10: qty 1

## 2017-07-10 MED ORDER — BUTALBITAL-APAP-CAFFEINE 50-325-40 MG PO TABS: 1.0000 | ORAL_TABLET | Freq: Four times a day (QID) | ORAL | 0 refills | Status: AC | PRN

## 2017-07-10 NOTE — ED Triage Notes (Signed)
Patient presents to ED via [facility] from work with c/o a migraine. Hx of same. Patient denies being on any medications for migraines. Patient reports light sensitivity. Denies CP or SOB.

## 2017-07-10 NOTE — ED Provider Notes (Signed)
[facility] Emergency Department Provider Note       Time seen: ----------------------------------------- 9:43 AM on 07/10/2017 -----------------------------------------   I have reviewed the triage vital signs and the nursing notes.  HISTORY   Chief Complaint No chief complaint on file.    HPI Sherry Hernandez is a 52 y.o. female with a history of dysuria, hypertension, obesity who presents to the ED for headache.  She states she is currently having a migraine and that she has a history of same but has not had one in many years.  She denies being on any medications for migraines.  She reports some light sensitivity, denies chest pain or shortness of breath.  Head pain is diffuse, 10 out of 10.  She reports being under a lot of stress due to her recent job change and she is not sleeping at night.  This is been going on for about a week.  Past Medical History:  Diagnosis Date  . Dysuria   . Frequency   . HTN (hypertension)   . Obesity   . Overactive bladder   . Sensory urge incontinence   . Stress incontinence     Patient Active Problem List   Diagnosis Date Noted  . Uncontrollable vomiting   . Intractable nausea and vomiting 07/22/2015    Past Surgical History:  Procedure Laterality Date  . ABDOMINAL HYSTERECTOMY    . COLONOSCOPY WITH PROPOFOL N/A 03/24/2015   Procedure: COLONOSCOPY WITH PROPOFOL;  Surgeon: Josefine Class, MD;  Location: [facility] ENDOSCOPY;  Service: Endoscopy;  Laterality: N/A;    Allergies Patient has no known allergies.  Social History Social History   Tobacco Use  . Smoking status: Former Smoker    Years: 7.00    Last attempt to quit: 09/20/2007    Years since quitting: 9.8  Substance Use Topics  . Alcohol use: No    Alcohol/week: 0.0 oz  . Drug use: No    Review of Systems Constitutional: Negative for fever. Eyes: Negative for vision changes ENT:  Negative for congestion, sore throat Cardiovascular:  Negative for chest pain. Respiratory: Negative for shortness of breath. Gastrointestinal: Negative for abdominal pain, vomiting and diarrhea. Genitourinary: Negative for dysuria. Musculoskeletal: Negative for back pain. Skin: Negative for rash. Neurological: Positive for headache  All systems negative/normal/unremarkable except as stated in the HPI  ____________________________________________   PHYSICAL EXAM:  VITAL SIGNS: ED Triage Vitals  Enc Vitals Group     BP      Pulse      Resp      Temp      Temp src      SpO2      Weight      Height      Head Circumference      Peak Flow      Pain Score      Pain Loc      Pain Edu?      Excl. in [place]?     Constitutional: Alert and oriented. Well appearing and in no distress. Eyes: Conjunctivae are normal. Normal extraocular movements.  No photophobia is noted ENT   Head: Normocephalic and atraumatic.   Nose: No congestion/rhinnorhea.   Mouth/Throat: Mucous membranes are moist.   Neck: No stridor. Cardiovascular: Normal rate, regular rhythm. No murmurs, rubs, or gallops. Respiratory: Normal respiratory effort without tachypnea nor retractions. Breath sounds are clear and equal bilaterally. No wheezes/rales/rhonchi. Gastrointestinal: Soft and nontender. Normal bowel sounds Musculoskeletal: Nontender with normal range of motion  in extremities. No lower extremity tenderness nor edema. Neurologic:  Normal speech and language. No gross focal neurologic deficits are appreciated.  Skin:  Skin is warm, dry and intact. No rash noted. Psychiatric: Depressed mood and affect ____________________________________________  ED COURSE:  As part of my medical decision making, I reviewed the following data within the [place] History obtained from family if available, nursing notes, old chart and ekg, as well as notes from prior ED visits. Patient presented for headache, we will assess with labs and imaging as  indicated at this time.   Procedures ____________________________________________   LABS (pertinent positives/negatives)  Labs Reviewed  COMPREHENSIVE METABOLIC PANEL - Abnormal; Notable for the following components:      Result Value   Glucose, Bld 107 (*)    Calcium 8.7 (*)    Total Protein 5.8 (*)    Albumin 3.3 (*)    All other components within normal limits  CBC WITH DIFFERENTIAL/PLATELET  ____________________________________________  DIFFERENTIAL DIAGNOSIS   Tension headache, migraine, anxiety, depression, insomnia, subarachnoid hemorrhage  FINAL ASSESSMENT AND PLAN  Headache   Plan: Patient had presented for headache. Patient's labs are unremarkable here.  She was given IV headache cocktail and CT imaging was unremarkable.  She will be discharged with Fioricet for headache and encouraged to take a sleep aid at night.  Otherwise she is stable for outpatient follow-up.   Earleen Newport, MD   Note: This note was generated in part or whole with voice recognition software. Voice recognition is usually quite accurate but there are transcription errors that can and very often do occur. I apologize for any typographical errors that were not detected and corrected.     Earleen Newport, MD 07/10/17 1242

## 2017-07-10 NOTE — ED Notes (Signed)
E sig not available at this time. Pt verbalized understanding of all.  D/c with family.

## 2017-08-25 ENCOUNTER — Other Ambulatory Visit: Payer: Self-pay

## 2017-08-25 ENCOUNTER — Emergency Department
Admission: EM | Admit: 2017-08-25 | Discharge: 2017-08-25 | Disposition: A | Discharge status: Home / Self Care | Payer: No Typology Code available for payment source | Attending: Emergency Medicine | Admitting: Emergency Medicine

## 2017-08-25 DIAGNOSIS — R69 Illness, unspecified: Secondary | ICD-10-CM

## 2017-08-25 DIAGNOSIS — Z87891 Personal history of nicotine dependence: Secondary | ICD-10-CM | POA: Insufficient documentation

## 2017-08-25 DIAGNOSIS — Z79899 Other long term (current) drug therapy: Secondary | ICD-10-CM | POA: Insufficient documentation

## 2017-08-25 DIAGNOSIS — I10 Essential (primary) hypertension: Secondary | ICD-10-CM | POA: Insufficient documentation

## 2017-08-25 DIAGNOSIS — R197 Diarrhea, unspecified: Secondary | ICD-10-CM | POA: Insufficient documentation

## 2017-08-25 DIAGNOSIS — R1110 Vomiting, unspecified: Secondary | ICD-10-CM | POA: Insufficient documentation

## 2017-08-25 DIAGNOSIS — J111 Influenza due to unidentified influenza virus with other respiratory manifestations: Secondary | ICD-10-CM | POA: Insufficient documentation

## 2017-08-25 LAB — INFLUENZA PANEL BY PCR (TYPE A & B)
Influenza A By PCR: NEGATIVE
Influenza B By PCR: NEGATIVE

## 2017-08-25 MED ORDER — OSELTAMIVIR PHOSPHATE 75 MG PO CAPS: 75.0000 mg | ORAL_CAPSULE | Freq: Two times a day (BID) | ORAL | 0 refills | Status: AC

## 2017-08-25 NOTE — ED Triage Notes (Addendum)
Pt c/o generalized bodyaches with N/V/D with cough and congestion for the past 2 days. Pt states she was exposed to the flu and strep throat that her grandson with dx with today.

## 2017-08-25 NOTE — ED Triage Notes (Signed)
FIRST NURSE NOTE- NVD with body aches denies fevers.  Ambulatory without difficulty. Given mask.

## 2017-08-25 NOTE — ED Notes (Signed)
See triage note  States she developed generalized body aches and chills for the past 2 days   States family members tested positive for the flu

## 2017-08-25 NOTE — ED Provider Notes (Signed)
[facility] Emergency Department Provider Note  ____________________________________________  Time seen: Approximately 6:21 PM  I have reviewed the triage vital signs and the nursing notes.   HISTORY  Chief Complaint Emesis; URI; and Diarrhea    HPI Sherry Hernandez is a 52 y.o. female presents to the emergency department with headaches, congestion, nonproductive cough, rhinorrhea, diarrhea, emesis and malaise that started today.  Patient reports that her grandson has been diagnosed with influenza A.  Patient is tolerating fluids by mouth.  No major changes in urinary habits.  No recent travel.  No alleviating measures of been attempted.   Past Medical History:  Diagnosis Date  . Dysuria   . Frequency   . HTN (hypertension)   . Obesity   . Overactive bladder   . Sensory urge incontinence   . Stress incontinence     Patient Active Problem List   Diagnosis Date Noted  . Uncontrollable vomiting   . Intractable nausea and vomiting 07/22/2015    Past Surgical History:  Procedure Laterality Date  . ABDOMINAL HYSTERECTOMY    . COLONOSCOPY WITH PROPOFOL N/A 03/24/2015   Procedure: COLONOSCOPY WITH PROPOFOL;  Surgeon: Josefine Class, MD;  Location: [facility] ENDOSCOPY;  Service: Endoscopy;  Laterality: N/A;    Prior to Admission medications   Medication Sig Start Date End Date Taking? Authorizing Provider  amoxicillin (AMOXIL) 875 MG tablet Take 1 tablet (875 mg total) by mouth 2 (two) times daily. Patient not taking: Reported on 07/10/2017 12/11/15   Sable Feil, PA-C  benzonatate (TESSALON PERLES) 100 MG capsule Take 1 capsule (100 mg total) by mouth 3 (three) times daily as needed for cough. Patient not taking: Reported on 07/10/2017 12/11/15   Sable Feil, PA-C  butalbital-acetaminophen-caffeine (FIORICET, ESGIC) [id number] MG tablet Take 1-2 tablets by mouth every 6 (six) hours as needed for headache. 07/10/17 07/10/18  Earleen Newport, MD   cyclobenzaprine (FLEXERIL) 5 MG tablet Take 1 tablet (5 mg total) by mouth every 8 (eight) hours as needed for muscle spasms. Patient not taking: Reported on 07/10/2017 11/05/15   Menshew, Dannielle Karvonen, PA-C  dicyclomine (BENTYL) 10 MG capsule Take 1 capsule (10 mg total) by mouth 3 (three) times daily before meals. 07/21/15 08/04/15  Menshew, Dannielle Karvonen, PA-C  fexofenadine-pseudoephedrine (ALLEGRA-D) 60-120 MG 12 hr tablet Take 1 tablet by mouth 2 (two) times daily. Patient not taking: Reported on 07/10/2017 12/11/15   Sable Feil, PA-C  furosemide (LASIX) 20 MG tablet Take 1 tablet (20 mg total) by mouth daily. Patient not taking: Reported on 07/10/2017 01/14/17 01/14/18  Loney Hering, MD  lisinopril-hydrochlorothiazide (PRINZIDE,ZESTORETIC) 10-12.5 MG per tablet Take 1 tablet by mouth daily.    [provider]  mirabegron ER (MYRBETRIQ) 50 MG TB24 tablet Take 1 tablet (50 mg total) by mouth daily. Patient not taking: Reported on 03/24/2015 01/05/15   Ardis Hughs, MD  mirabegron ER (MYRBETRIQ) 50 MG TB24 tablet Take 1 tablet (50 mg total) by mouth daily. Patient not taking: Reported on 07/22/2015 01/30/15   Zara Council A, PA-C  multivitamin-iron-minerals-folic acid (CENTRUM) chewable tablet Chew 1 tablet by mouth daily.    [provider]  naproxen (EC NAPROSYN) 500 MG EC tablet Take 1 tablet (500 mg total) by mouth 2 (two) times daily with a meal. Patient not taking: Reported on 07/10/2017 11/05/15   Menshew, Dannielle Karvonen, PA-C  ondansetron (ZOFRAN) 4 MG tablet Take 1 tablet (4 mg total) by mouth every  6 (six) hours as needed for nausea or vomiting. Patient not taking: Reported on 07/10/2017 07/21/15   Menshew, Dannielle Karvonen, PA-C  oseltamivir (TAMIFLU) 75 MG capsule Take 1 capsule (75 mg total) by mouth 2 (two) times daily for 5 days. 08/25/17 08/30/17  Lannie Fields, PA-C  zolpidem (AMBIEN) 10 MG tablet Take 1 tablet (10 mg total) by mouth at bedtime as needed for  sleep. 07/10/17   Earleen Newport, MD    Allergies Patient has no known allergies.  Family History  Problem Relation Age of Onset  . Cancer Father   . Cancer Mother   . Breast cancer Mother 74  . Cancer Sister     Social History Social History   Tobacco Use  . Smoking status: Former Smoker    Years: 7.00    Last attempt to quit: 09/20/2007    Years since quitting: 9.9  . Smokeless tobacco: Never Used  Substance Use Topics  . Alcohol use: No    Alcohol/week: 0.0 oz  . Drug use: No      Review of Systems  Constitutional: Patient has fever.  Eyes: No visual changes. No discharge ENT: Patient has congestion.  Cardiovascular: no chest pain. Respiratory: Patient has cough.  Gastrointestinal: No abdominal pain.  No nausea, no vomiting. Patient had diarrhea.  Genitourinary: Negative for dysuria. No hematuria Musculoskeletal: Patient has myalgias.  Skin: Negative for rash, abrasions, lacerations, ecchymosis. Neurological: Patient has headache, no focal weakness or numbness.  ____________________________________________   PHYSICAL EXAM:  VITAL SIGNS: ED Triage Vitals  Enc Vitals Group     BP 08/25/17 1717 (!) 143/67     Pulse Rate 08/25/17 1717 71     Resp 08/25/17 1717 17     Temp 08/25/17 1717 98.1 F (36.7 C)     Temp Source 08/25/17 1717 Oral     SpO2 08/25/17 1717 96 %     Weight 08/25/17 1718 269 lb (122 kg)     Height 08/25/17 1718 5\' 6"  (1.676 m)     Head Circumference --      Peak Flow --      Pain Score 08/25/17 1718 8     Pain Loc --      Pain Edu? --      Excl. in [place]? --     Constitutional: Alert and oriented. Patient is lying supine. Eyes: Conjunctivae are normal. PERRL. EOMI. Head: Atraumatic. ENT:      Ears: Tympanic membranes are mildly injected with mild effusion bilaterally.       Nose: No congestion/rhinnorhea.      Mouth/Throat: Mucous membranes are moist. Posterior pharynx is mildly erythematous.   Hematological/Lymphatic/Immunilogical: No cervical lymphadenopathy.  Cardiovascular: Normal rate, regular rhythm. Normal S1 and S2.  Good peripheral circulation. Respiratory: Normal respiratory effort without tachypnea or retractions. Lungs CTAB. Good air entry to the bases with no decreased or absent breath sounds. Gastrointestinal: Bowel sounds 4 quadrants. Soft and nontender to palpation. No guarding or rigidity. No palpable masses. No distention. No CVA tenderness. Musculoskeletal: Full range of motion to all extremities. No gross deformities appreciated. Neurologic:  Normal speech and language. No gross focal neurologic deficits are appreciated.  Skin:  Skin is warm, dry and intact. No rash noted. Psychiatric: Mood and affect are normal. Speech and behavior are normal. Patient exhibits appropriate insight and judgement.    ____________________________________________   LABS (all labs ordered are listed, but only abnormal results are displayed)  Labs Reviewed  INFLUENZA PANEL  BY PCR (TYPE A & B)   ____________________________________________  EKG   ____________________________________________  RADIOLOGY   No results found.  ____________________________________________    PROCEDURES  Procedure(s) performed:    Procedures    Medications - No data to display   ____________________________________________   INITIAL IMPRESSION / ASSESSMENT AND PLAN / ED COURSE  Pertinent labs & imaging results that were available during my care of the patient were reviewed by me and considered in my medical decision making (see chart for details).  Review of the [place] CSRS was performed in accordance of the [place] prior to dispensing any controlled drugs.     Assessment and plan Influenza-like illness Patient presents to the emergency department with headache, rhinorrhea, congestion and nonproductive cough for the past 2 days.  History of physical exam findings are consistent  with an influenza-like illness.  Patient was discharged with Tamiflu and rest and hydration were encouraged.  Patient was advised to follow-up with primary care as needed.  All patient questions were answered.     ____________________________________________  FINAL CLINICAL IMPRESSION(S) / ED DIAGNOSES  Final diagnoses:  Influenza-like illness      NEW MEDICATIONS STARTED DURING THIS VISIT:  ED Discharge Orders        Ordered    oseltamivir (TAMIFLU) 75 MG capsule  2 times daily     08/25/17 1816          This chart was dictated using voice recognition software/Dragon. Despite best efforts to proofread, errors can occur which can change the meaning. Any change was purely unintentional.    Lannie Fields, PA-C 08/25/17 Royetta Asal, MD 08/25/17 Bosie Helper

## 2018-03-05 ENCOUNTER — Other Ambulatory Visit: Payer: Self-pay | Admitting: Family Medicine

## 2018-03-05 DIAGNOSIS — Z1231 Encounter for screening mammogram for malignant neoplasm of breast: Secondary | ICD-10-CM

## 2018-03-20 ENCOUNTER — Ambulatory Visit
Admission: RE | Admit: 2018-03-20 | Discharge: 2018-03-20 | Disposition: A | Discharge status: Home / Self Care | Payer: BLUE CROSS/BLUE SHIELD | Source: Ambulatory Visit | Attending: Family Medicine | Admitting: Family Medicine

## 2018-03-20 DIAGNOSIS — Z1231 Encounter for screening mammogram for malignant neoplasm of breast: Secondary | ICD-10-CM | POA: Diagnosis present

## 2019-02-12 ENCOUNTER — Other Ambulatory Visit: Payer: Self-pay | Admitting: Family Medicine

## 2019-02-12 DIAGNOSIS — Z1231 Encounter for screening mammogram for malignant neoplasm of breast: Secondary | ICD-10-CM

## 2019-03-20 ENCOUNTER — Emergency Department: Payer: BC Managed Care – PPO

## 2019-03-20 ENCOUNTER — Encounter: Payer: Self-pay | Admitting: Emergency Medicine

## 2019-03-20 ENCOUNTER — Emergency Department
Admission: EM | Admit: 2019-03-20 | Discharge: 2019-03-21 | Disposition: A | Discharge status: Other Acute Inpatient Hospital | Payer: BC Managed Care – PPO | Attending: Emergency Medicine | Admitting: Emergency Medicine

## 2019-03-20 DIAGNOSIS — R1110 Vomiting, unspecified: Secondary | ICD-10-CM | POA: Diagnosis present

## 2019-03-20 DIAGNOSIS — I10 Essential (primary) hypertension: Secondary | ICD-10-CM | POA: Insufficient documentation

## 2019-03-20 DIAGNOSIS — K529 Noninfective gastroenteritis and colitis, unspecified: Secondary | ICD-10-CM | POA: Diagnosis not present

## 2019-03-20 DIAGNOSIS — R531 Weakness: Secondary | ICD-10-CM

## 2019-03-20 DIAGNOSIS — M6281 Muscle weakness (generalized): Secondary | ICD-10-CM | POA: Insufficient documentation

## 2019-03-20 DIAGNOSIS — U071 COVID-19: Secondary | ICD-10-CM | POA: Diagnosis not present

## 2019-03-20 LAB — URINALYSIS, COMPLETE (UACMP) WITH MICROSCOPIC
Bacteria, UA: NONE SEEN
Bilirubin Urine: NEGATIVE
Glucose, UA: NEGATIVE mg/dL
Hgb urine dipstick: NEGATIVE
Ketones, ur: NEGATIVE mg/dL
Leukocytes,Ua: NEGATIVE
Nitrite: NEGATIVE
Protein, ur: NEGATIVE mg/dL
Specific Gravity, Urine: 1.029 (ref 1.005–1.030)
pH: 5 (ref 5.0–8.0)

## 2019-03-20 LAB — CBC WITH DIFFERENTIAL/PLATELET
Abs Immature Granulocytes: 0.01 10*3/uL (ref 0.00–0.07)
Basophils Absolute: 0 10*3/uL (ref 0.0–0.1)
Basophils Relative: 0 %
Eosinophils Absolute: 0 10*3/uL (ref 0.0–0.5)
Eosinophils Relative: 0 %
HCT: 44.9 % (ref 36.0–46.0)
Hemoglobin: 14.6 g/dL (ref 12.0–15.0)
Immature Granulocytes: 0 %
Lymphocytes Relative: 38 %
Lymphs Abs: 1.3 10*3/uL (ref 0.7–4.0)
MCH: 28.6 pg (ref 26.0–34.0)
MCHC: 32.5 g/dL (ref 30.0–36.0)
MCV: 88 fL (ref 80.0–100.0)
Monocytes Absolute: 0.3 10*3/uL (ref 0.1–1.0)
Monocytes Relative: 10 %
Neutro Abs: 1.7 10*3/uL (ref 1.7–7.7)
Neutrophils Relative %: 52 %
Platelets: 158 10*3/uL (ref 150–400)
RBC: 5.1 MIL/uL (ref 3.87–5.11)
RDW: 13.9 % (ref 11.5–15.5)
WBC: 3.3 10*3/uL — ABNORMAL LOW (ref 4.0–10.5)
nRBC: 0 % (ref 0.0–0.2)

## 2019-03-20 LAB — COMPREHENSIVE METABOLIC PANEL
ALT: 25 U/L (ref 0–44)
AST: 25 U/L (ref 15–41)
Albumin: 3.1 g/dL — ABNORMAL LOW (ref 3.5–5.0)
Alkaline Phosphatase: 90 U/L (ref 38–126)
Anion gap: 8 (ref 5–15)
BUN: 19 mg/dL (ref 6–20)
CO2: 25 mmol/L (ref 22–32)
Calcium: 8.8 mg/dL — ABNORMAL LOW (ref 8.9–10.3)
Chloride: 108 mmol/L (ref 98–111)
Creatinine, Ser: 0.74 mg/dL (ref 0.44–1.00)
GFR calc Af Amer: >60 mL/min (ref >60)
GFR calc non Af Amer: >60 mL/min (ref >60)
Glucose, Bld: 96 mg/dL (ref 70–99)
Potassium: 3.9 mmol/L (ref 3.5–5.1)
Sodium: 141 mmol/L (ref 135–145)
Total Bilirubin: 0.4 mg/dL (ref 0.3–1.2)
Total Protein: 6 g/dL — ABNORMAL LOW (ref 6.5–8.1)

## 2019-03-20 LAB — PROCALCITONIN: Procalcitonin: <0.1 ng/mL

## 2019-03-20 LAB — SARS CORONAVIRUS 2 BY RT PCR (HOSPITAL ORDER, PERFORMED IN ~~LOC~~ HOSPITAL LAB): SARS Coronavirus 2: POSITIVE — AB

## 2019-03-20 MED ORDER — PROMETHAZINE HCL 25 MG/ML IJ SOLN
12.5000 mg | Freq: Once | INTRAMUSCULAR | Status: AC
  Administered 2019-03-20: 21:00:00 12.5 mg via INTRAVENOUS
  Filled 2019-03-20: qty 1

## 2019-03-20 MED ORDER — ONDANSETRON HCL 4 MG/2ML IJ SOLN
4.0000 mg | Freq: Once | INTRAMUSCULAR | Status: AC
  Administered 2019-03-20: 17:00:00 4 mg via INTRAVENOUS
  Filled 2019-03-20: qty 2

## 2019-03-20 MED ORDER — IOHEXOL 9 MG/ML PO SOLN
500.0000 mL | Freq: Two times a day (BID) | ORAL | Status: DC | PRN
  Administered 2019-03-20: 18:00:00 500 mL via ORAL
  Filled 2019-03-20: qty 500

## 2019-03-20 MED ORDER — SODIUM CHLORIDE 0.9 % IV BOLUS
1500.0000 mL | Freq: Once | INTRAVENOUS | Status: AC
  Administered 2019-03-20: 17:00:00 1500 mL via INTRAVENOUS

## 2019-03-20 MED ORDER — ONDANSETRON HCL 4 MG/2ML IJ SOLN
4.0000 mg | Freq: Once | INTRAMUSCULAR | Status: AC
Start: 2019-03-20 — End: 2019-03-20
  Administered 2019-03-20: 18:00:00 4 mg via INTRAVENOUS
  Filled 2019-03-20: qty 2

## 2019-03-20 MED ORDER — IOHEXOL 300 MG/ML  SOLN
100.0000 mL | Freq: Once | INTRAMUSCULAR | Status: AC | PRN
  Administered 2019-03-20: 19:00:00 100 mL via INTRAVENOUS

## 2019-03-20 MED ORDER — SODIUM CHLORIDE 0.9 % IV BOLUS: 1000.0000 mL | Freq: Once | INTRAVENOUS | Status: DC

## 2019-03-20 MED ORDER — SODIUM CHLORIDE 0.9 % IV BOLUS: 500.0000 mL | Freq: Once | INTRAVENOUS | Status: DC

## 2019-03-20 MED ORDER — SODIUM CHLORIDE 0.9 % IV SOLN
1000.0000 mL | Freq: Once | INTRAVENOUS | Status: AC
  Administered 2019-03-20: 1000 mL via INTRAVENOUS

## 2019-03-20 NOTE — ED Notes (Signed)
Update given to patient's daughter with patient's verbal permission.

## 2019-03-20 NOTE — ED Triage Notes (Signed)
Pt to ED by EMS with c/o of COVID symptoms including HA, NVD, fever and being unable to eat for 5 days. Pt currently 95% on RA.

## 2019-03-20 NOTE — ED Provider Notes (Addendum)
[facility] Emergency Department Provider Note ____________________________________________   First MD Initiated Contact with Patient 03/20/19 1542     (approximate)  I have reviewed the triage vital signs and the nursing notes.   HISTORY  Chief Complaint COVID Positive  HPI Sherry Hernandez is a 53 y.o. female history of hypertension  Patient started having dry cough, body aches fevers.  Seen in urgent care and tested positive for COVID-19 on the eighth of this month  The last couple days she has been feeling very nauseated SOC abdominal pain, a little bit short of breath but minimal.  No chest pain.  Continues to have low-grade fevers took Tylenol this morning.  She reports that she just cannot keep anything on her stomach for the last 2 days.  She is vomited up yellow anything she tries to eat or keep down just comes back up     Past Medical History:  Diagnosis Date  . Dysuria   . Frequency   . HTN (hypertension)   . Obesity   . Overactive bladder   . Sensory urge incontinence   . Stress incontinence     Patient Active Problem List   Diagnosis Date Noted  . Uncontrollable vomiting   . Intractable nausea and vomiting 07/22/2015    Past Surgical History:  Procedure Laterality Date  . ABDOMINAL HYSTERECTOMY    . COLONOSCOPY WITH PROPOFOL N/A 03/24/2015   Procedure: COLONOSCOPY WITH PROPOFOL;  Surgeon: Josefine Class, MD;  Location[facility] ENDOSCOPY;  Service: Endoscopy;  Laterality: N/A;    Prior to Admission medications   Medication Sig Start Date End Date Taking? Authorizing Provider  amoxicillin (AMOXIL) 875 MG tablet Take 1 tablet (875 mg total) by mouth 2 (two) times daily. Patient not taking: Reported on 07/10/2017 12/11/15   Sable Feil, PA-C  benzonatate (TESSALON PERLES) 100 MG capsule Take 1 capsule (100 mg total) by mouth 3 (three) times daily as needed for cough. Patient not taking: Reported on 07/10/2017 12/11/15   Sable Feil, PA-C  cyclobenzaprine (FLEXERIL) 5 MG tablet Take 1 tablet (5 mg total) by mouth every 8 (eight) hours as needed for muscle spasms. Patient not taking: Reported on 07/10/2017 11/05/15   Menshew, Dannielle Karvonen, PA-C  dicyclomine (BENTYL) 10 MG capsule Take 1 capsule (10 mg total) by mouth 3 (three) times daily before meals. 07/21/15 08/04/15  Menshew, Dannielle Karvonen, PA-C  fexofenadine-pseudoephedrine (ALLEGRA-D) 60-120 MG 12 hr tablet Take 1 tablet by mouth 2 (two) times daily. Patient not taking: Reported on 07/10/2017 12/11/15   Sable Feil, PA-C  furosemide (LASIX) 20 MG tablet Take 1 tablet (20 mg total) by mouth daily. Patient not taking: Reported on 07/10/2017 01/14/17 01/14/18  Loney Hering, MD  lisinopril-hydrochlorothiazide (PRINZIDE,ZESTORETIC) 10-12.5 MG per tablet Take 1 tablet by mouth daily.    [provider]  mirabegron ER (MYRBETRIQ) 50 MG TB24 tablet Take 1 tablet (50 mg total) by mouth daily. Patient not taking: Reported on 03/24/2015 01/05/15   Ardis Hughs, MD  mirabegron ER (MYRBETRIQ) 50 MG TB24 tablet Take 1 tablet (50 mg total) by mouth daily. Patient not taking: Reported on 07/22/2015 01/30/15   Zara Council A, PA-C  multivitamin-iron-minerals-folic acid (CENTRUM) chewable tablet Chew 1 tablet by mouth daily.    [provider]  naproxen (EC NAPROSYN) 500 MG EC tablet Take 1 tablet (500 mg total) by mouth 2 (two) times daily with a meal. Patient not taking: Reported on  07/10/2017 11/05/15   Menshew, Dannielle Karvonen, PA-C  ondansetron (ZOFRAN) 4 MG tablet Take 1 tablet (4 mg total) by mouth every 6 (six) hours as needed for nausea or vomiting. Patient not taking: Reported on 07/10/2017 07/21/15   Menshew, Dannielle Karvonen, PA-C  zolpidem (AMBIEN) 10 MG tablet Take 1 tablet (10 mg total) by mouth at bedtime as needed for sleep. 07/10/17   Earleen Newport, MD    Allergies Patient has no known allergies.  Family History  Problem Relation Age of Onset   . Cancer Father   . Cancer Mother   . Breast cancer Mother 19  . Cancer Sister     Social History Social History   Tobacco Use  . Smoking status: Former Smoker    Years: 7.00    Quit date: 09/20/2007    Years since quitting: 11.5  . Smokeless tobacco: Never Used  Substance Use Topics  . Alcohol use: No    Alcohol/week: 0.0 standard drinks  . Drug use: No    Review of Systems Constitutional: See HPI.  Some lightheadedness feels dehydrated Eyes: No visual changes. ENT: No sore throat. Cardiovascular: Denies chest pain. Respiratory: Slight shortness of breath at times.  Dry cough.  Gastrointestinal: Stomach just feels achy all over, nauseated whenever stressed anything she will throw it up.   Genitourinary: Negative for dysuria. Musculoskeletal: Negative for back pain. Skin: Negative for rash. Neurological: Negative for headaches, areas of focal weakness or numbness.    ____________________________________________   PHYSICAL EXAM:  VITAL SIGNS: ED Triage Vitals  Enc Vitals Group     BP 03/20/19 1427 (!) 146/80     Pulse Rate 03/20/19 1427 86     Resp 03/20/19 1427 16     Temp 03/20/19 1427 99.5 F (37.5 C)     Temp Source 03/20/19 1427 Oral     SpO2 03/20/19 1427 96 %     Weight 03/20/19 1430 214 lb (97.1 kg)     Height 03/20/19 1430 5\' 6"  (1.676 m)     Head Circumference --      Peak Flow --      Pain Score 03/20/19 1429 10     Pain Loc --      Pain Edu? --      Excl. in [place]? --     Constitutional: Alert and oriented.  Mild to moderately ill-appearing.  Appears generally fatigued but very pleasant and alert without acute distress Eyes: Conjunctivae are normal. Head: Atraumatic. Nose: No congestion/rhinnorhea. Mouth/Throat: Mucous membranes are moist. Neck: No stridor.  Cardiovascular: Normal rate, regular rhythm. Grossly normal heart sounds.  Good peripheral circulation. Respiratory: Normal respiratory effort.  No retractions. Lungs CTAB.  Occasional  dry cough Gastrointestinal: Soft and reports mild tenderness throughout, no focal peritonitis.. No distention. Musculoskeletal: No lower extremity tenderness nor edema. Neurologic:  Normal speech and language. No gross focal neurologic deficits are appreciated.  Skin:  Skin is warm, dry and intact. No rash noted. Psychiatric: Mood and affect are normal. Speech and behavior are normal.  ____________________________________________   LABS (all labs ordered are listed, but only abnormal results are displayed)  Labs Reviewed  SARS CORONAVIRUS 2 (HOSPITAL ORDER, [facility]) - Abnormal; Notable for the following components:      Result Value   SARS Coronavirus 2 POSITIVE (*)    All other components within normal limits  CBC WITH DIFFERENTIAL/PLATELET - Abnormal; Notable for the following components:   WBC 3.3 (*)  All other components within normal limits  COMPREHENSIVE METABOLIC PANEL - Abnormal; Notable for the following components:   Calcium 8.8 (*)    Total Protein 6.0 (*)    Albumin 3.1 (*)    All other components within normal limits  URINALYSIS, COMPLETE (UACMP) WITH MICROSCOPIC - Abnormal; Notable for the following components:   Color, Urine YELLOW (*)    APPearance CLEAR (*)    All other components within normal limits  PROCALCITONIN   ____________________________________________  EKG Reviewed interpreted at 2310 Heart rate 80 QRS 85 QTc 430 Sinus rhythm, minimal nonspecific ST changes  ____________________________________________  RADIOLOGY  Ct Abdomen Pelvis W Contrast  Result Date: 03/20/2019 CLINICAL DATA:  Evaluate for ileus. Nausea and vomiting. COVID-19 positive. EXAM: CT ABDOMEN AND PELVIS WITH CONTRAST TECHNIQUE: Multidetector CT imaging of the abdomen and pelvis was performed using the standard protocol following bolus administration of intravenous contrast. CONTRAST:  124mL OMNIPAQUE IOHEXOL 300 MG/ML  SOLN COMPARISON:  CT  abdomen pelvis 07/22/2015 FINDINGS: Lower chest: Normal heart size. Dependent atelectasis within the bilateral lower lobes. No pleural effusion. Hepatobiliary: Liver is normal in size and contour. Fatty deposition adjacent to the falciform ligament. Prior cholecystectomy. No intrahepatic or extrahepatic biliary ductal dilatation. Pancreas: Unremarkable Spleen: Unremarkable Adrenals/Urinary Tract: Normal adrenal glands. Kidneys enhance symmetrically with contrast. No hydronephrosis. Urinary bladder is unremarkable. Stomach/Bowel: Normal morphology of the stomach. No evidence for small bowel obstruction. No free fluid or free intraperitoneal air. Normal appendix. There is mild wall thickening of the colon, particularly the descending and sigmoid colon. Vascular/Lymphatic: Normal caliber abdominal aorta. No retroperitoneal lymphadenopathy. Reproductive: Prior hysterectomy. Similar-appearing 4.3 cm left adnexal cystic structure. Prominent follicles within the right ovary. Other: None. Musculoskeletal: No aggressive or acute appearing osseous lesions. IMPRESSION: 1. There is mild wall thickening of the colon, particularly the descending and sigmoid colon as can be seen with colitis in the appropriate clinical setting. 2. No evidence for small bowel obstruction. 3. Similar-appearing 4.3 cm left adnexal cystic structure. Recommend follow-up pelvic ultrasound in 3-6 months to ensure stability. Electronically Signed   By: Lovey Newcomer M.D.   On: 03/20/2019 19:12   Dg Chest Port 1 View  Result Date: 03/20/2019 CLINICAL DATA:  COVID-19 positive last Tuesday. Nausea, vomiting and diarrhea with fever and headache. EXAM: PORTABLE CHEST 1 VIEW COMPARISON:  01/13/2017 FINDINGS: Lungs are adequately inflated without lobar consolidation or effusion. Cardiomediastinal silhouette and remainder of the exam is unchanged. IMPRESSION: No acute cardiopulmonary disease. Electronically Signed   By: Marin Olp M.D.   On: 03/20/2019 14:57    Dg Abd 2 Views  Result Date: 03/20/2019 CLINICAL DATA:  Patient with nausea and vomiting. COVID-19 positive. EXAM: ABDOMEN - 2 VIEW COMPARISON:  CT abdomen pelvis 07/22/2015 FINDINGS: Lung bases are clear. Gaseous distended loops of small bowel are demonstrated within the central abdomen. Gas is present within the colon. Cholecystectomy clips. Lower thoracic and lumbar spine degenerative changes. Pelvic phleboliths. SI joints unremarkable. IMPRESSION: Gaseous distended loops of small bowel within the central abdomen with distal colonic gas raising the possibility of ileus. Electronically Signed   By: Lovey Newcomer M.D.   On: 03/20/2019 17:17     Imaging studies reviewed.  Chest x-ray no acute.  CT imaging reviewed, no bowel obstruction but notable findings potentially indicative of colitis.  Left adnexal cystic structure ____________________________________________   PROCEDURES  Procedure(s) performed: None  Procedures  Critical Care performed: No  ____________________________________________   INITIAL IMPRESSION / ASSESSMENT AND PLAN / ED COURSE  Pertinent labs & imaging results that were available during my care of the patient were reviewed by me and considered in my medical decision making (see chart for details).   Sherry Hernandez was evaluated in Emergency Department on 03/21/2019 for the symptoms described in the history of present illness. She was evaluated in the context of the global COVID-19 pandemic, which necessitated consideration that the patient might be at risk for infection with the SARS-CoV-2 virus that causes COVID-19. Institutional protocols and algorithms that pertain to the evaluation of patients at risk for COVID-19 are in a state of rapid change based on information released by regulatory bodies including the CDC and federal and state organizations. These policies and algorithms were followed during the patient's care in the ED.  Patient reports a positive COVID  test at urgent care, I do not see this resulted in our system.  Patient symptomatology does appear consistent however with COVID.  Will retest here, hydrate, provide antiemetics, chest x-ray is clear and she is not hypoxic with normal work of breathing which is to me reassuring.  She appears overall nontoxic but suspect an element of gastroenteritis, possible ileus or even obstruction given the symptoms described in her x-ray demonstrates possible ileus-like findings  ----------------------------------------- 5:55 PM on 03/20/2019 -----------------------------------------  Discussed, reexamined patient.  Resting receiving fluids.  Nausea just slightly better, will give additional dose of Zofran, have ordered CT scan and discussed this with the patient to evaluate further for any obstruction or ileus.  Patient comfortable with plan.   Discussed with patient via phone several times throughout the shift, she is continued to experience ongoing nausea, unremitting, fatigue despite multiple antiemetics and fluids.  I discussed with the patient, and she would like to be admitted to the hospital.  We discussed going to [facility] and she was already familiar with this as she reported a family member or another acquaintance who had been admitted there.  She would like to be transferred to [facility], does not feel that she can safely go home with nausea medication, feels like she continues to feel miserable fatigued and nauseated.  I discussed with the patient, I also discussed with [place] attending and the patient is accepted in transfer for observation and further work-up and care.  Discussed risk benefits of providing IV steroid, both myself and [facility] attending do not see a strong indication for IV steroid at this time.  Patient does not have any hypoxia, no noted increased work of breathing.  ----------------------------------------- 1:05 AM on 03/21/2019  -----------------------------------------  Ongoing care assigned to Dr. Karma Greaser.  Patient pending transfer to [facility] for COVID-19 with intractable nausea, colitis     ____________________________________________   FINAL CLINICAL IMPRESSION(S) / ED DIAGNOSES  Final diagnoses:  COVID-19 virus infection  Generalized weakness  Colitis        Note:  This document was prepared using Dragon voice recognition software and may include unintentional dictation errors       Delman Kitten, MD 03/21/19 0105    Delman Kitten, MD 04/01/19 1538

## 2019-03-21 ENCOUNTER — Other Ambulatory Visit: Payer: Self-pay

## 2019-03-21 ENCOUNTER — Encounter (HOSPITAL_COMMUNITY): Payer: Self-pay | Admitting: *Deleted

## 2019-03-21 ENCOUNTER — Inpatient Hospital Stay (HOSPITAL_COMMUNITY)
Admission: AD | Admit: 2019-03-21 | Discharge: 2019-03-24 | DRG: 177 | Disposition: A | Discharge status: Home / Self Care | Payer: BC Managed Care – PPO | Source: Other Acute Inpatient Hospital | Attending: Family Medicine | Admitting: Family Medicine

## 2019-03-21 DIAGNOSIS — E860 Dehydration: Secondary | ICD-10-CM | POA: Diagnosis present

## 2019-03-21 DIAGNOSIS — J1282 Pneumonia due to coronavirus disease 2019: Secondary | ICD-10-CM

## 2019-03-21 DIAGNOSIS — U071 COVID-19: Principal | ICD-10-CM | POA: Diagnosis present

## 2019-03-21 DIAGNOSIS — A0839 Other viral enteritis: Secondary | ICD-10-CM | POA: Diagnosis not present

## 2019-03-21 DIAGNOSIS — G43009 Migraine without aura, not intractable, without status migrainosus: Secondary | ICD-10-CM | POA: Diagnosis not present

## 2019-03-21 DIAGNOSIS — R7989 Other specified abnormal findings of blood chemistry: Secondary | ICD-10-CM | POA: Diagnosis present

## 2019-03-21 DIAGNOSIS — Z79899 Other long term (current) drug therapy: Secondary | ICD-10-CM | POA: Diagnosis not present

## 2019-03-21 DIAGNOSIS — I10 Essential (primary) hypertension: Secondary | ICD-10-CM | POA: Diagnosis present

## 2019-03-21 DIAGNOSIS — Z791 Long term (current) use of non-steroidal anti-inflammatories (NSAID): Secondary | ICD-10-CM

## 2019-03-21 DIAGNOSIS — J1289 Other viral pneumonia: Secondary | ICD-10-CM | POA: Diagnosis present

## 2019-03-21 DIAGNOSIS — Z90710 Acquired absence of both cervix and uterus: Secondary | ICD-10-CM | POA: Diagnosis not present

## 2019-03-21 DIAGNOSIS — K5289 Other specified noninfective gastroenteritis and colitis: Secondary | ICD-10-CM | POA: Diagnosis present

## 2019-03-21 DIAGNOSIS — R112 Nausea with vomiting, unspecified: Secondary | ICD-10-CM | POA: Diagnosis present

## 2019-03-21 DIAGNOSIS — E669 Obesity, unspecified: Secondary | ICD-10-CM | POA: Diagnosis not present

## 2019-03-21 DIAGNOSIS — Z803 Family history of malignant neoplasm of breast: Secondary | ICD-10-CM | POA: Diagnosis not present

## 2019-03-21 DIAGNOSIS — Z87891 Personal history of nicotine dependence: Secondary | ICD-10-CM

## 2019-03-21 DIAGNOSIS — Z6834 Body mass index (BMI) 34.0-34.9, adult: Secondary | ICD-10-CM | POA: Diagnosis not present

## 2019-03-21 MED ORDER — ZOLPIDEM TARTRATE 5 MG PO TABS: 10.0000 mg | ORAL_TABLET | Freq: Every evening | ORAL | Status: DC | PRN

## 2019-03-21 MED ORDER — ONDANSETRON HCL 4 MG/2ML IJ SOLN: 4.0000 mg | Freq: Once | INTRAMUSCULAR | Status: DC

## 2019-03-21 MED ORDER — ACETAMINOPHEN 500 MG PO TABS
1000.0000 mg | ORAL_TABLET | Freq: Once | ORAL | Status: AC
  Administered 2019-03-21: 09:00:00 1000 mg via ORAL
  Filled 2019-03-21: qty 2

## 2019-03-21 MED ORDER — TRAMADOL HCL 50 MG PO TABS
50.0000 mg | ORAL_TABLET | Freq: Four times a day (QID) | ORAL | Status: DC | PRN
  Administered 2019-03-21 – 2019-03-22 (×3): 50 mg via ORAL
  Filled 2019-03-21 (×3): qty 1

## 2019-03-21 MED ORDER — DEXTROSE-NACL 5-0.9 % IV SOLN
INTRAVENOUS | Status: DC
  Administered 2019-03-21 – 2019-03-22 (×3): via INTRAVENOUS

## 2019-03-21 MED ORDER — ACETAMINOPHEN 500 MG PO TABS: 1000.0000 mg | ORAL_TABLET | Freq: Once | ORAL | Status: DC

## 2019-03-21 MED ORDER — MIRABEGRON ER 50 MG PO TB24
50.0000 mg | ORAL_TABLET | Freq: Every day | ORAL | Status: DC
  Administered 2019-03-21 – 2019-03-24 (×4): 50 mg via ORAL
  Filled 2019-03-21 (×5): qty 1

## 2019-03-21 MED ORDER — ONDANSETRON HCL 4 MG/2ML IJ SOLN
4.0000 mg | Freq: Once | INTRAMUSCULAR | Status: AC
  Administered 2019-03-21: 09:00:00 4 mg via INTRAVENOUS
  Filled 2019-03-21: qty 2

## 2019-03-21 MED ORDER — PROMETHAZINE HCL 25 MG PO TABS
12.5000 mg | ORAL_TABLET | Freq: Four times a day (QID) | ORAL | Status: DC | PRN
  Administered 2019-03-21 – 2019-03-22 (×3): 12.5 mg via ORAL
  Filled 2019-03-21 (×3): qty 1

## 2019-03-21 MED ORDER — ENOXAPARIN SODIUM 40 MG/0.4ML ~~LOC~~ SOLN
40.0000 mg | SUBCUTANEOUS | Status: DC
  Administered 2019-03-21 – 2019-03-24 (×4): 40 mg via SUBCUTANEOUS
  Filled 2019-03-21 (×4): qty 0.4

## 2019-03-21 MED ORDER — ACETAMINOPHEN 325 MG PO TABS
650.0000 mg | ORAL_TABLET | Freq: Four times a day (QID) | ORAL | Status: DC | PRN
  Administered 2019-03-22: 650 mg via ORAL
  Filled 2019-03-21: qty 2

## 2019-03-21 MED ORDER — CYCLOBENZAPRINE HCL 5 MG PO TABS
5.0000 mg | ORAL_TABLET | Freq: Three times a day (TID) | ORAL | Status: DC | PRN
  Administered 2019-03-21 – 2019-03-23 (×2): 5 mg via ORAL
  Filled 2019-03-21 (×3): qty 1

## 2019-03-21 MED ORDER — ZOLPIDEM TARTRATE 5 MG PO TABS
5.0000 mg | ORAL_TABLET | Freq: Every evening | ORAL | Status: DC | PRN
  Administered 2019-03-21: 5 mg via ORAL
  Filled 2019-03-21: qty 1

## 2019-03-21 MED ORDER — SODIUM CHLORIDE 0.9 % IV SOLN: INTRAVENOUS | Status: DC

## 2019-03-21 MED ORDER — BENZONATATE 100 MG PO CAPS
100.0000 mg | ORAL_CAPSULE | Freq: Three times a day (TID) | ORAL | Status: DC | PRN
  Administered 2019-03-21 – 2019-03-22 (×2): 100 mg via ORAL
  Filled 2019-03-21 (×2): qty 1

## 2019-03-21 NOTE — ED Notes (Signed)
Report to megan, rn.  

## 2019-03-21 NOTE — ED Notes (Signed)
Paged into room with video phone. Pt continues to sleep.

## 2019-03-21 NOTE — ED Notes (Signed)
Pt D/C from [facility], transferred to [facility] via [place] at this time. Pt noted to be in stable condition.

## 2019-03-21 NOTE — ED Notes (Signed)
Report given to April RN

## 2019-03-21 NOTE — ED Notes (Signed)
Patient is lying on left side for comfort. Patient requested something to drink, but c/o intractable nausea that was not relieved by phenergan. MD aware. Room darkened for comfort. Call bell within reach.

## 2019-03-21 NOTE — ED Notes (Signed)
Report from sonja, rn.  

## 2019-03-21 NOTE — ED Notes (Signed)
This RN and Jinny Blossom, Rn introduced self to patient. Patient resting at this time, patient states no needs. Call light in reach. Patient complains of some nausea at this time, denies any vomiting since arriving to the ER. Patient on phone upon arrival to bedside. Lights remained dimmed for patient comfort. Will continue to monitor for any patient needs.

## 2019-03-21 NOTE — ED Notes (Signed)
Dialed in with video telephone to pt's room. Pt sleeping. Call bell at left side.

## 2019-03-21 NOTE — ED Notes (Signed)
Dialed into pt's room on video phone. Pt continues to sleep.

## 2019-03-21 NOTE — H&P (Signed)
History and Physical    Loney Balram [id number] DOB: 1966/02/25 DOA: 03/21/2019  PCP: Sharyne Peach, MD Patient coming from: home via [facility] ED  Chief Complaint: nausea and vomiting   HPI: 53 y.o. former smoker w/ a hx of HTN and migraines who was diagnosed w/ COVID 9/8 after she developed fevers, HA, and N/V/D. She presented to the [facility] ED w/ unrelenting worsening N/V. She was afebrile, not SOB, and not hypoxic. She was given 2.5L NS and multiple doses of antiemetics, but her sx persisted and she was not able to tolerate oral intake.  At the time of my evaluation at [facility] she reports mild ongoing headache, persisting severe nausea, no current vomiting, complete lack of appetite, severe generalized weakness, and a sore throat.  She presently denies shortness of breath.  She denies chest pain.  She reports some intermittent diarrhea.  She states her abdomen is crampy and tender in general but there is no focal pain.  She is alert and oriented.  Significant Events: 9/8 diagnosed w/ COVID  9/13 admit to [facility] via [facility] ED   Assessment/Plan  Severe COVID Gastroenteritis -intractable nausea vomiting and diarrhea The patient has become clinically dehydrated as a result of her persisting nausea vomiting and frequent diarrhea -she will require IV fluid resuscitation beyond what she is already received in the ED -this will put her at risk for developing pulmonary infiltrates /revealing COVID pneumonia -we will need to monitor her respiratory status closely  Recent Labs  Lab 03/20/19 1540  ALT 25  PROCALCITON <0.10    4.3cm L adnexal cystic structure Will need follow-up ultrasound in 3-6 months per radiology recommendation  HTN In the setting of dehydration blood pressures currently well controlled  Obesity - Estimated body mass index is 34.8 kg/m as calculated from the following:   Height as of this encounter: 5\' 6"  (1.676 m).   Weight as of this encounter: 97.8 kg.   DVT  prophylaxis: lovenox  Code Status: FULL Family Communication:   Disposition Plan: Admit to MedSurg bed Consults called: None  Review of Systems: As per HPI otherwise 10 point review of systems negative.   Past Medical History:  Diagnosis Date  . Dysuria   . Frequency   . HTN (hypertension)   . Obesity   . Overactive bladder   . Sensory urge incontinence   . Stress incontinence     Past Surgical History:  Procedure Laterality Date  . ABDOMINAL HYSTERECTOMY    . COLONOSCOPY WITH PROPOFOL N/A 03/24/2015   Procedure: COLONOSCOPY WITH PROPOFOL;  Surgeon: Josefine Class, MD;  Location: [facility] ENDOSCOPY;  Service: Endoscopy;  Laterality: N/A;    Family History  Family History  Problem Relation Age of Onset  . Cancer Father   . Cancer Mother   . Breast cancer Mother 76  . Cancer Sister     Social History   reports that she quit smoking about 11 years ago. She quit after 7.00 years of use. She has never used smokeless tobacco. She reports that she does not drink alcohol or use drugs.   Lives alone in [place], [place] - work in a [place] at a [employer] [place] job   Allergies No Known Allergies  Prior to Admission medications   Medication Sig Start Date End Date Taking? Authorizing Provider  amoxicillin (AMOXIL) 875 MG tablet Take 1 tablet (875 mg total) by mouth 2 (two) times daily. Patient not taking: Reported on 07/10/2017 12/11/15   Sable Feil, PA-C  benzonatate (TESSALON PERLES) 100 MG capsule Take 1 capsule (100 mg total) by mouth 3 (three) times daily as needed for cough. Patient not taking: Reported on 07/10/2017 12/11/15   Sable Feil, PA-C  cyclobenzaprine (FLEXERIL) 5 MG tablet Take 1 tablet (5 mg total) by mouth every 8 (eight) hours as needed for muscle spasms. Patient not taking: Reported on 07/10/2017 11/05/15   Menshew, Dannielle Karvonen, PA-C  dicyclomine (BENTYL) 10 MG capsule Take 1 capsule (10 mg total) by mouth 3 (three) times daily before meals. 07/21/15 08/04/15   Menshew, Dannielle Karvonen, PA-C  fexofenadine-pseudoephedrine (ALLEGRA-D) 60-120 MG 12 hr tablet Take 1 tablet by mouth 2 (two) times daily. Patient not taking: Reported on 07/10/2017 12/11/15   Sable Feil, PA-C  furosemide (LASIX) 20 MG tablet Take 1 tablet (20 mg total) by mouth daily. Patient not taking: Reported on 07/10/2017 01/14/17 01/14/18  Loney Hering, MD  lisinopril-hydrochlorothiazide (PRINZIDE,ZESTORETIC) 10-12.5 MG per tablet Take 1 tablet by mouth daily.    [provider]  mirabegron ER (MYRBETRIQ) 50 MG TB24 tablet Take 1 tablet (50 mg total) by mouth daily. Patient not taking: Reported on 03/24/2015 01/05/15   Ardis Hughs, MD  mirabegron ER (MYRBETRIQ) 50 MG TB24 tablet Take 1 tablet (50 mg total) by mouth daily. Patient not taking: Reported on 07/22/2015 01/30/15   Zara Council A, PA-C  multivitamin-iron-minerals-folic acid (CENTRUM) chewable tablet Chew 1 tablet by mouth daily.    [provider]  naproxen (EC NAPROSYN) 500 MG EC tablet Take 1 tablet (500 mg total) by mouth 2 (two) times daily with a meal. Patient not taking: Reported on 07/10/2017 11/05/15   Menshew, Dannielle Karvonen, PA-C  ondansetron (ZOFRAN) 4 MG tablet Take 1 tablet (4 mg total) by mouth every 6 (six) hours as needed for nausea or vomiting. Patient not taking: Reported on 07/10/2017 07/21/15   Menshew, Dannielle Karvonen, PA-C  zolpidem (AMBIEN) 10 MG tablet Take 1 tablet (10 mg total) by mouth at bedtime as needed for sleep. 07/10/17   Earleen Newport, MD    Physical Exam: Vitals:   03/21/19 1002  BP: 118/70  Pulse: 73  Resp: 17  Temp: 98.3 F (36.8 C)  TempSrc: Oral  SpO2: 95%  Weight: 97.8 kg  Height: 5\' 6"  (1.676 m)    Constitutional: NAD, calm, comfortable Eyes: PERRL, lids and conjunctivae normal ENMT: Mucous membranes dry Neck: normal, supple, no masses, no thyromegaly Respiratory: clear to auscultation bilaterally, no wheezing, no crackles. Normal respiratory  effort. No accessory muscle use.  Cardiovascular: Regular rate and rhythm, no murmurs / rubs / gallops. No extremity edema. 2+ pedal pulses. No carotid bruits.  Abdomen: Mildly tender diffusely, no mass, no rebound, soft, bowel sounds positive Musculoskeletal: no clubbing / cyanosis. No joint deformity upper and lower extremities. Good ROM, no contractures. Normal muscle tone.  Skin: no rashes, lesions, ulcers. No induration Neurologic: CN 2-12 grossly intact. Sensation intact, DTR normal. Strength 5/5 in all 4.  Psychiatric: Normal judgment and insight. Alert and oriented x 3. Normal mood.    Labs on Admission:   CBC: Recent Labs  Lab 03/20/19 1540  WBC 3.3*  NEUTROABS 1.7  HGB 14.6  HCT 44.9  MCV 88.0  PLT 0000000   Basic Metabolic Panel: Recent Labs  Lab 03/20/19 1540  NA 141  K 3.9  CL 108  CO2 25  GLUCOSE 96  BUN 19  CREATININE 0.74  CALCIUM 8.8*   GFR: Estimated  Creatinine Clearance: 93.7 mL/min (by C-G formula based on SCr of 0.74 mg/dL).   Liver Function Tests: Recent Labs  Lab 03/20/19 1540  AST 25  ALT 25  ALKPHOS 90  BILITOT 0.4  PROT 6.0*  ALBUMIN 3.1*   Urine analysis:    Component Value Date/Time   COLORURINE YELLOW (A) 03/20/2019 1953   APPEARANCEUR CLEAR (A) 03/20/2019 1953   APPEARANCEUR Clear 04/08/2013 2258   LABSPEC 1.029 03/20/2019 1953   LABSPEC 1.009 04/08/2013 2258   PHURINE 5.0 03/20/2019 1953   GLUCOSEU NEGATIVE 03/20/2019 1953   GLUCOSEU Negative 04/08/2013 2258   HGBUR NEGATIVE 03/20/2019 1953   BILIRUBINUR NEGATIVE 03/20/2019 1953   BILIRUBINUR Negative 04/08/2013 2258   [place] 03/20/2019 1953   PROTEINUR NEGATIVE 03/20/2019 1953   NITRITE NEGATIVE 03/20/2019 1953   LEUKOCYTESUR NEGATIVE 03/20/2019 1953   LEUKOCYTESUR Negative 04/08/2013 2258    Recent Results (from the past 240 hour(s))  SARS Coronavirus 2 [facility] order, Performed in [facility] hospital lab) Nasopharyngeal Nasopharyngeal Swab     Status:  Abnormal   Collection Time: 03/20/19  5:12 PM   Specimen: Nasopharyngeal Swab  Result Value Ref Range Status   SARS Coronavirus 2 POSITIVE (A) NEGATIVE Final    Comment: RESULT CALLED TO, READ BACK BY AND VERIFIED WITH: SONJIA WEAVER @2020  ON 03/20/2019 BY FMW (NOTE) If result is NEGATIVE SARS-CoV-2 target nucleic acids are NOT DETECTED. The SARS-CoV-2 RNA is generally detectable in upper and lower  respiratory specimens during the acute phase of infection. The lowest  concentration of SARS-CoV-2 viral copies this assay can detect is 250  copies / mL. A negative result does not preclude SARS-CoV-2 infection  and should not be used as the sole basis for treatment or other  patient management decisions.  A negative result may occur with  improper specimen collection / handling, submission of specimen other  than nasopharyngeal swab, presence of viral mutation(s) within the  areas targeted by this assay, and inadequate number of viral copies  (<250 copies / mL). A negative result must be combined with clinical  observations, patient history, and epidemiological information. If result is POSITIVE SARS-CoV-2 target nucleic acids are DETECTE D. The SARS-CoV-2 RNA is generally detectable in upper and lower  respiratory specimens during the acute phase of infection.  Positive  results are indicative of active infection with SARS-CoV-2.  Clinical  correlation with patient history and other diagnostic information is  necessary to determine patient infection status.  Positive results do  not rule out bacterial infection or co-infection with other viruses. If result is PRESUMPTIVE POSTIVE SARS-CoV-2 nucleic acids MAY BE PRESENT.   A presumptive positive result was obtained on the submitted specimen  and confirmed on repeat testing.  While 2019 novel coronavirus  (SARS-CoV-2) nucleic acids may be present in the submitted sample  additional confirmatory testing may be necessary for  epidemiological  and / or clinical management purposes  to differentiate between  SARS-CoV-2 and other Sarbecovirus currently known to infect humans.  If clinically indicated additional testing with an alternate test  methodology (LAB745 3) is advised. The SARS-CoV-2 RNA is generally  detectable in upper and lower respiratory specimens during the acute  phase of infection. The expected result is Negative. Fact Sheet for Patients:  StrictlyIdeas.no Fact Sheet for Healthcare Providers: BankingDealers.co.za This test is not yet approved or cleared by the [place] FDA and has been authorized for detection and/or diagnosis of SARS-CoV-2 by FDA under an Emergency Use Authorization (EUA).  This EUA will remain in effect (meaning this test can be used) for the duration of the COVID-19 declaration under Section 564(b)(1) of the Act, 21 U.S.C. section 360bbb-3(b)(1), unless the authorization is terminated or revoked sooner. Performed at [facility], [address] [place], [place] [address]      Radiological Exams on Admission: Ct Abdomen Pelvis W Contrast  Result Date: 03/20/2019 CLINICAL DATA:  Evaluate for ileus. Nausea and vomiting. COVID-19 positive. EXAM: CT ABDOMEN AND PELVIS WITH CONTRAST TECHNIQUE: Multidetector CT imaging of the abdomen and pelvis was performed using the standard protocol following bolus administration of intravenous contrast. CONTRAST:  120mL OMNIPAQUE IOHEXOL 300 MG/ML  SOLN COMPARISON:  CT abdomen pelvis 07/22/2015 FINDINGS: Lower chest: Normal heart size. Dependent atelectasis within the bilateral lower lobes. No pleural effusion. Hepatobiliary: Liver is normal in size and contour. Fatty deposition adjacent to the falciform ligament. Prior cholecystectomy. No intrahepatic or extrahepatic biliary ductal dilatation. Pancreas: Unremarkable Spleen: Unremarkable Adrenals/Urinary Tract: Normal adrenal glands.  Kidneys enhance symmetrically with contrast. No hydronephrosis. Urinary bladder is unremarkable. Stomach/Bowel: Normal morphology of the stomach. No evidence for small bowel obstruction. No free fluid or free intraperitoneal air. Normal appendix. There is mild wall thickening of the colon, particularly the descending and sigmoid colon. Vascular/Lymphatic: Normal caliber abdominal aorta. No retroperitoneal lymphadenopathy. Reproductive: Prior hysterectomy. Similar-appearing 4.3 cm left adnexal cystic structure. Prominent follicles within the right ovary. Other: None. Musculoskeletal: No aggressive or acute appearing osseous lesions. IMPRESSION: 1. There is mild wall thickening of the colon, particularly the descending and sigmoid colon as can be seen with colitis in the appropriate clinical setting. 2. No evidence for small bowel obstruction. 3. Similar-appearing 4.3 cm left adnexal cystic structure. Recommend follow-up pelvic ultrasound in 3-6 months to ensure stability. Electronically Signed   By: Lovey Newcomer M.D.   On: 03/20/2019 19:12   Dg Chest Port 1 View  Result Date: 03/20/2019 CLINICAL DATA:  COVID-19 positive last Tuesday. Nausea, vomiting and diarrhea with fever and headache. EXAM: PORTABLE CHEST 1 VIEW COMPARISON:  01/13/2017 FINDINGS: Lungs are adequately inflated without lobar consolidation or effusion. Cardiomediastinal silhouette and remainder of the exam is unchanged. IMPRESSION: No acute cardiopulmonary disease. Electronically Signed   By: Marin Olp M.D.   On: 03/20/2019 14:57   Dg Abd 2 Views  Result Date: 03/20/2019 CLINICAL DATA:  Patient with nausea and vomiting. COVID-19 positive. EXAM: ABDOMEN - 2 VIEW COMPARISON:  CT abdomen pelvis 07/22/2015 FINDINGS: Lung bases are clear. Gaseous distended loops of small bowel are demonstrated within the central abdomen. Gas is present within the colon. Cholecystectomy clips. Lower thoracic and lumbar spine degenerative changes. Pelvic  phleboliths. SI joints unremarkable. IMPRESSION: Gaseous distended loops of small bowel within the central abdomen with distal colonic gas raising the possibility of ileus. Electronically Signed   By: Lovey Newcomer M.D.   On: 03/20/2019 17:17    Cherene Altes, MD [facility] Office  [phone] Pager - Text Page per Amion as per below:  On-Call/Text Page:      [email or web address]  If 7PM-7AM, please contact night-coverage [email or web address] 03/21/2019, 10:26 AM

## 2019-03-21 NOTE — ED Notes (Signed)
Pt sleeping. 

## 2019-03-21 NOTE — Progress Notes (Signed)
Patient arrived from [name] to unit and plan for shift.  Dr. [name] notified of patient arrival.  Telephone call to patient's son, Sherry Hernandez, updated on patient transfer and answered all questions.

## 2019-03-21 NOTE — ED Notes (Signed)
Pt hit call bell. This RN checked on what pt needed. Pt stated she was nauseous and "my head is busting." Verbal orders obtained from Dr. Kerman Passey.

## 2019-03-21 NOTE — ED Notes (Signed)
Pt assisted up to commode. Pt steady on feet an needs no assitance ambulating. Pt has not had any emesis since 0300. Pt provided with gingerale per request.

## 2019-03-22 ENCOUNTER — Inpatient Hospital Stay (HOSPITAL_COMMUNITY): Payer: BC Managed Care – PPO

## 2019-03-22 DIAGNOSIS — J1289 Other viral pneumonia: Secondary | ICD-10-CM

## 2019-03-22 DIAGNOSIS — G43009 Migraine without aura, not intractable, without status migrainosus: Secondary | ICD-10-CM

## 2019-03-22 LAB — COMPREHENSIVE METABOLIC PANEL
ALT: 24 U/L (ref 0–44)
AST: 22 U/L (ref 15–41)
Albumin: 2.6 g/dL — ABNORMAL LOW (ref 3.5–5.0)
Alkaline Phosphatase: 84 U/L (ref 38–126)
Anion gap: 6 (ref 5–15)
BUN: 10 mg/dL (ref 6–20)
CO2: 27 mmol/L (ref 22–32)
Calcium: 8.4 mg/dL — ABNORMAL LOW (ref 8.9–10.3)
Chloride: 108 mmol/L (ref 98–111)
Creatinine, Ser: 0.82 mg/dL (ref 0.44–1.00)
GFR calc Af Amer: >60 mL/min (ref >60)
GFR calc non Af Amer: >60 mL/min (ref >60)
Glucose, Bld: 99 mg/dL (ref 70–99)
Potassium: 3.8 mmol/L (ref 3.5–5.1)
Sodium: 141 mmol/L (ref 135–145)
Total Bilirubin: 0.5 mg/dL (ref 0.3–1.2)
Total Protein: 5.5 g/dL — ABNORMAL LOW (ref 6.5–8.1)

## 2019-03-22 LAB — CBC WITH DIFFERENTIAL/PLATELET
Abs Immature Granulocytes: 0.01 10*3/uL (ref 0.00–0.07)
Basophils Absolute: 0 10*3/uL (ref 0.0–0.1)
Basophils Relative: 0 %
Eosinophils Absolute: 0 10*3/uL (ref 0.0–0.5)
Eosinophils Relative: 0 %
HCT: 40.8 % (ref 36.0–46.0)
Hemoglobin: 12.9 g/dL (ref 12.0–15.0)
Immature Granulocytes: 0 %
Lymphocytes Relative: 47 %
Lymphs Abs: 1.2 10*3/uL (ref 0.7–4.0)
MCH: 28.9 pg (ref 26.0–34.0)
MCHC: 31.6 g/dL (ref 30.0–36.0)
MCV: 91.3 fL (ref 80.0–100.0)
Monocytes Absolute: 0.2 10*3/uL (ref 0.1–1.0)
Monocytes Relative: 9 %
Neutro Abs: 1.1 10*3/uL — ABNORMAL LOW (ref 1.7–7.7)
Neutrophils Relative %: 44 %
Platelets: 126 10*3/uL — ABNORMAL LOW (ref 150–400)
RBC: 4.47 MIL/uL (ref 3.87–5.11)
RDW: 14.2 % (ref 11.5–15.5)
WBC: 2.5 10*3/uL — ABNORMAL LOW (ref 4.0–10.5)
nRBC: 0 % (ref 0.0–0.2)

## 2019-03-22 LAB — ABO/RH: ABO/RH(D): A POS

## 2019-03-22 LAB — MAGNESIUM: Magnesium: 1.9 mg/dL (ref 1.7–2.4)

## 2019-03-22 LAB — FERRITIN: Ferritin: 63 ng/mL (ref 11–307)

## 2019-03-22 LAB — C-REACTIVE PROTEIN: CRP: 1.1 mg/dL — ABNORMAL HIGH (ref <1.0)

## 2019-03-22 LAB — D-DIMER, QUANTITATIVE: D-Dimer, Quant: 0.38 ug/mL-FEU (ref 0.00–0.50)

## 2019-03-22 LAB — HIV ANTIBODY (ROUTINE TESTING W REFLEX): HIV Screen 4th Generation wRfx: NONREACTIVE

## 2019-03-22 MED ORDER — TRAZODONE HCL 50 MG PO TABS
50.0000 mg | ORAL_TABLET | Freq: Every evening | ORAL | Status: DC | PRN
  Administered 2019-03-22: 21:00:00 50 mg via ORAL
  Filled 2019-03-22: qty 1

## 2019-03-22 MED ORDER — MIRABEGRON ER 50 MG PO TB24: 50.0000 mg | ORAL_TABLET | Freq: Every day | ORAL | Status: DC

## 2019-03-22 MED ORDER — SUMATRIPTAN SUCCINATE 25 MG PO TABS
25.0000 mg | ORAL_TABLET | ORAL | Status: DC | PRN
  Administered 2019-03-22: 25 mg via ORAL
  Filled 2019-03-22 (×2): qty 1

## 2019-03-22 NOTE — Progress Notes (Signed)
[facility] TEAM 1 - Stepdown/ICU TEAM  Sherry Hernandez  [id number] DOB: 12-Aug-1965 DOA: 03/21/2019 PCP: Sharyne Peach, MD    Brief Narrative:  53 y.o. former smoker w/ a hx of HTN and migraines who was diagnosed w/ COVID 9/8 after she developed fevers, HA, and N/V/D. She presented to the [facility] ED w/ unrelenting worsening N/V. She was afebrile, not SOB, and not hypoxic. She was given 2.5L NS and multiple doses of antiemetics, but her sx persisted and she was not able to tolerate oral intake.  Significant Events: 9/8 diagnosed w/ COVID  9/13 admit to [facility] via [facility] ED   COVID-19 specific Treatment: none  Subjective: Oxygen saturations remained stable despite volume resuscitation.  Follow-up chest x-ray this morning, which I have personally reviewed, raises concern for a possible early infiltrate in the right upper lobe. The pt reports ongoing severe nausea, with no vomiting. Diarrhea appears to have ceased for now. She has a persisting HA, and does not yet feel she is ready to try solid food due to her unrelenting nausea.   Assessment & Plan:  Severe COVID Gastroenteritis - intractable nausea vomiting and diarrhea patient remains clinically dehydrated as a result of her persisting nausea vomiting and frequent diarrhea -she requires ongoing IV fluid resuscitation due to her inability to tolerate signif oral intake presently   COVID Pneumonia we will need to continue to monitor her respiratory status closely as we continue to hydrate - CXR today notes possible early R apical infiltrate    Recent Labs  Lab 03/20/19 1540 03/22/19 0405  DDIMER  --  0.38  FERRITIN  --  63  CRP  --  1.1*  ALT 25 24  PROCALCITON <0.10  --     4.3cm L adnexal cystic structure Will need follow-up ultrasound in 3-6 months per radiology recommendation  HTN In the setting of dehydration blood pressures currently well controlled  Obesity  DVT prophylaxis: Lovenox Code Status: FULL CODE Family  Communication:  Disposition Plan:   Consultants:  none  Antimicrobials:  none  Objective: Blood pressure 120/62, pulse 66, temperature 97.7 F (36.5 C), temperature source Oral, resp. rate 12, height 5\' 6"  (1.676 m), weight 97.8 kg, SpO2 94 %.  Intake/Output Summary (Last 24 hours) at 03/22/2019 0852 Last data filed at 03/22/2019 0750 Gross per 24 hour  Intake 1174.39 ml  Output 1700 ml  Net -525.61 ml   Filed Weights   03/21/19 1002  Weight: 97.8 kg    Examination: General: No acute respiratory distress Lungs: Clear to auscultation B - no wheezing  Cardiovascular: RRR Abdomen: NT/ND, soft, bs+, no mass  Extremities: No edema B LE   CBC: Recent Labs  Lab 03/20/19 1540 03/22/19 0405  WBC 3.3* 2.5*  NEUTROABS 1.7 1.1*  HGB 14.6 12.9  HCT 44.9 40.8  MCV 88.0 91.3  PLT 158 [id number]*   Basic Metabolic Panel: Recent Labs  Lab 03/20/19 1540 03/22/19 0405  NA 141 141  K 3.9 3.8  CL 108 108  CO2 25 27  GLUCOSE 96 99  BUN 19 10  CREATININE 0.74 0.82  CALCIUM 8.8* 8.4*  MG  --  1.9   GFR: Estimated Creatinine Clearance: 91.4 mL/min (by C-G formula based on SCr of 0.82 mg/dL).  Liver Function Tests: Recent Labs  Lab 03/20/19 1540 03/22/19 0405  AST 25 22  ALT 25 24  ALKPHOS 90 84  BILITOT 0.4 0.5  PROT 6.0* 5.5*  ALBUMIN 3.1* 2.6*    Recent  Results (from the past 240 hour(s))  SARS Coronavirus 2 [facility] order, Performed in [facility] hospital lab) Nasopharyngeal Nasopharyngeal Swab     Status: Abnormal   Collection Time: 03/20/19  5:12 PM   Specimen: Nasopharyngeal Swab  Result Value Ref Range Status   SARS Coronavirus 2 POSITIVE (A) NEGATIVE Final    Comment: RESULT CALLED TO, READ BACK BY AND VERIFIED WITH: SONJIA WEAVER @2020  ON 03/20/2019 BY FMW (NOTE) If result is NEGATIVE SARS-CoV-2 target nucleic acids are NOT DETECTED. The SARS-CoV-2 RNA is generally detectable in upper and lower  respiratory specimens during the acute phase of infection.  The lowest  concentration of SARS-CoV-2 viral copies this assay can detect is 250  copies / mL. A negative result does not preclude SARS-CoV-2 infection  and should not be used as the sole basis for treatment or other  patient management decisions.  A negative result may occur with  improper specimen collection / handling, submission of specimen other  than nasopharyngeal swab, presence of viral mutation(s) within the  areas targeted by this assay, and inadequate number of viral copies  (<250 copies / mL). A negative result must be combined with clinical  observations, patient history, and epidemiological information. If result is POSITIVE SARS-CoV-2 target nucleic acids are DETECTE D. The SARS-CoV-2 RNA is generally detectable in upper and lower  respiratory specimens during the acute phase of infection.  Positive  results are indicative of active infection with SARS-CoV-2.  Clinical  correlation with patient history and other diagnostic information is  necessary to determine patient infection status.  Positive results do  not rule out bacterial infection or co-infection with other viruses. If result is PRESUMPTIVE POSTIVE SARS-CoV-2 nucleic acids MAY BE PRESENT.   A presumptive positive result was obtained on the submitted specimen  and confirmed on repeat testing.  While 2019 novel coronavirus  (SARS-CoV-2) nucleic acids may be present in the submitted sample  additional confirmatory testing may be necessary for epidemiological  and / or clinical management purposes  to differentiate between  SARS-CoV-2 and other Sarbecovirus currently known to infect humans.  If clinically indicated additional testing with an alternate test  methodology (LAB745 3) is advised. The SARS-CoV-2 RNA is generally  detectable in upper and lower respiratory specimens during the acute  phase of infection. The expected result is Negative. Fact Sheet for Patients:  StrictlyIdeas.no  Fact Sheet for Healthcare Providers: BankingDealers.co.za This test is not yet approved or cleared by the [place] FDA and has been authorized for detection and/or diagnosis of SARS-CoV-2 by FDA under an Emergency Use Authorization (EUA).  This EUA will remain in effect (meaning this test can be used) for the duration of the COVID-19 declaration under Section 564(b)(1) of the Act, 21 U.S.C. section 360bbb-3(b)(1), unless the authorization is terminated or revoked sooner. Performed at [facility], [address] [place], [place] [address]      Scheduled Meds: . enoxaparin (LOVENOX) injection  40 mg Subcutaneous Q24H  . mirabegron ER  50 mg Oral Daily   Continuous Infusions: . dextrose 5 % and 0.9% NaCl 75 mL/hr at 03/22/19 0626     LOS: 1 day   Cherene Altes, MD [facility] Office  [phone] Pager - Text Page per Shea Evans  If 7PM-7AM, please contact night-coverage per Amion 03/22/2019, 8:52 AM

## 2019-03-23 LAB — COMPREHENSIVE METABOLIC PANEL
ALT: 51 U/L — ABNORMAL HIGH (ref 0–44)
AST: 50 U/L — ABNORMAL HIGH (ref 15–41)
Albumin: 2.6 g/dL — ABNORMAL LOW (ref 3.5–5.0)
Alkaline Phosphatase: 93 U/L (ref 38–126)
Anion gap: 6 (ref 5–15)
BUN: 7 mg/dL (ref 6–20)
CO2: 27 mmol/L (ref 22–32)
Calcium: 8.4 mg/dL — ABNORMAL LOW (ref 8.9–10.3)
Chloride: 108 mmol/L (ref 98–111)
Creatinine, Ser: 0.76 mg/dL (ref 0.44–1.00)
GFR calc Af Amer: >60 mL/min (ref >60)
GFR calc non Af Amer: >60 mL/min (ref >60)
Glucose, Bld: 102 mg/dL — ABNORMAL HIGH (ref 70–99)
Potassium: 3.7 mmol/L (ref 3.5–5.1)
Sodium: 141 mmol/L (ref 135–145)
Total Bilirubin: 0.3 mg/dL (ref 0.3–1.2)
Total Protein: 5.2 g/dL — ABNORMAL LOW (ref 6.5–8.1)

## 2019-03-23 LAB — CBC WITH DIFFERENTIAL/PLATELET
Abs Immature Granulocytes: 0.01 10*3/uL (ref 0.00–0.07)
Basophils Absolute: 0 10*3/uL (ref 0.0–0.1)
Basophils Relative: 0 %
Eosinophils Absolute: 0 10*3/uL (ref 0.0–0.5)
Eosinophils Relative: 0 %
HCT: 40.6 % (ref 36.0–46.0)
Hemoglobin: 13 g/dL (ref 12.0–15.0)
Immature Granulocytes: 0 %
Lymphocytes Relative: 40 %
Lymphs Abs: 1.2 10*3/uL (ref 0.7–4.0)
MCH: 29.1 pg (ref 26.0–34.0)
MCHC: 32 g/dL (ref 30.0–36.0)
MCV: 91 fL (ref 80.0–100.0)
Monocytes Absolute: 0.3 10*3/uL (ref 0.1–1.0)
Monocytes Relative: 8 %
Neutro Abs: 1.6 10*3/uL — ABNORMAL LOW (ref 1.7–7.7)
Neutrophils Relative %: 52 %
Platelets: 119 10*3/uL — ABNORMAL LOW (ref 150–400)
RBC: 4.46 MIL/uL (ref 3.87–5.11)
RDW: 14.1 % (ref 11.5–15.5)
WBC: 3 10*3/uL — ABNORMAL LOW (ref 4.0–10.5)
nRBC: 0 % (ref 0.0–0.2)

## 2019-03-23 LAB — MAGNESIUM: Magnesium: 1.8 mg/dL (ref 1.7–2.4)

## 2019-03-23 LAB — C-REACTIVE PROTEIN: CRP: 1.5 mg/dL — ABNORMAL HIGH (ref <1.0)

## 2019-03-23 LAB — FERRITIN: Ferritin: 70 ng/mL (ref 11–307)

## 2019-03-23 LAB — D-DIMER, QUANTITATIVE: D-Dimer, Quant: 0.31 ug/mL-FEU (ref 0.00–0.50)

## 2019-03-23 MED ORDER — PROCHLORPERAZINE EDISYLATE 10 MG/2ML IJ SOLN
10.0000 mg | Freq: Four times a day (QID) | INTRAMUSCULAR | Status: DC | PRN
  Administered 2019-03-23: 10 mg via INTRAVENOUS
  Filled 2019-03-23 (×2): qty 2

## 2019-03-23 NOTE — Plan of Care (Signed)
Attempt to call Sherry Hernandez with updates,  No answer and brief update left on voice mail.

## 2019-03-23 NOTE — Progress Notes (Addendum)
0700 bedside report received, introduced myself to pt, updated the board  0800 assessment and vs taken and charted  0900 gave morning meds  1000 ambulated pt to bedside commode, pt up to chair  1100 gave fresh ice water  1200 pt ate lunch no further needs, pt states that she tolerated the full liquid and is ready for regular food now, diet advanced to regular  1400 pt watching tv no needs  1613 called family to update and no one answered

## 2019-03-23 NOTE — Progress Notes (Signed)
Sherry Hernandez  Sherry Hernandez  [id number] DOB: 1965-08-22 DOA: 03/21/2019 PCP: Sharyne Peach, MD    Brief Narrative:  53 y.o. former smoker w/ a hx of HTN and migraines who was diagnosed w/ COVID 9/8 after she developed fevers, HA, and N/V/D. She presented to the [facility] ED w/ unrelenting worsening N/V. She was afebrile, not SOB, and not hypoxic. She was given 2.5L NS and multiple doses of antiemetics, but her sx persisted and she was not able to tolerate oral intake.  Significant Events: 9/8 diagnosed w/ COVID  9/13 admit to [facility] via [facility] ED   COVID-19 specific Treatment: none  Subjective: Follow-up chest x-ray 9/14 raised concern for an early infiltrate in the right upper lobe.  The patient feels dramatically better today.  She is sitting up in a chair.  She is alert oriented and interactive.  She feels her appetite is returning and is interested in attempting to advance to regular diet.  She reports a mild cough but no significant shortness of breath.  Assessment & Plan:  Severe COVID Gastroenteritis - intractable nausea vomiting and diarrhea Appears to be turning the corner now -stop IV support -advance diet and monitor for tolerance  COVID Pneumonia we will need to continue to monitor her respiratory status closely due to the appearance of a possible R apical infiltrate post hydration on her CXR 9/14 -continue to monitor oxygen saturations and repeat CXR in a.m.  Recent Labs  Lab 03/20/19 1540 03/22/19 0405 03/23/19 0325  DDIMER  --  0.38 0.31  FERRITIN  --  63 70  CRP  --  1.1* 1.5*  ALT 25 24 59*  PROCALCITON <0.10  --   --     Mild transaminitis This has newly appeared today -likely related to COVID gastroenteritis -recheck in a.m.  4.3cm L adnexal cystic structure Will need follow-up ultrasound in 3-6 months per Radiology recommendation  HTN Blood pressure remains well controlled  Obesity - Estimated body mass index is 34.8 kg/m  as calculated from the following:   Height as of this encounter: 5\' 6"  (1.676 m).   Weight as of this encounter: 97.8 kg.   DVT prophylaxis: Lovenox Code Status: FULL CODE Family Communication:  Disposition Plan: Advance diet -follow-up CXR in a.m. -if respiratory status remains stable and patient is able to tolerate a diet she will be a potential candidate for discharge 9/16  Consultants:  none  Antimicrobials:  none  Objective: Blood pressure 136/66, pulse 83, temperature 98.7 F (37.1 C), temperature source Oral, resp. rate 19, height 5\' 6"  (1.676 m), weight 97.8 kg, SpO2 95 %.  Intake/Output Summary (Last 24 hours) at 03/23/2019 0843 Last data filed at 03/23/2019 0500 Gross per 24 hour  Intake 2858.22 ml  Output 2950 ml  Net -91.78 ml   Filed Weights   03/21/19 1002  Weight: 97.8 kg    Examination: General: No acute respiratory distress Lungs: Good air movement throughout with no focal crackles appreciable Cardiovascular: RRR without murmur Abdomen: NT/ND, soft, bs+, no mass  Extremities: No edema B lower extremities  CBC: Recent Labs  Lab 03/20/19 1540 03/22/19 0405 03/23/19 0325  WBC 3.3* 2.5* 3.0*  NEUTROABS 1.7 1.1* 1.6*  HGB 14.6 12.9 13.0  HCT 44.9 40.8 40.6  MCV 88.0 91.3 91.0  PLT 158 126* 123456*   Basic Metabolic Panel: Recent Labs  Lab 03/20/19 1540 03/22/19 0405 03/23/19 0325  NA 141 141 141  K 3.9 3.8 3.7  CL 108 108 108  CO2 25 27 27   GLUCOSE 96 99 102*  BUN 19 10 7   CREATININE 0.74 0.82 0.76  CALCIUM 8.8* 8.4* 8.4*  MG  --  1.9 1.8   GFR: Estimated Creatinine Clearance: 93.7 mL/min (by C-G formula based on SCr of 0.76 mg/dL).  Liver Function Tests: Recent Labs  Lab 03/20/19 1540 03/22/19 0405 03/23/19 0325  AST 25 22 50*  ALT 25 24 51*  ALKPHOS 90 84 93  BILITOT 0.4 0.5 0.3  PROT 6.0* 5.5* 5.2*  ALBUMIN 3.1* 2.6* 2.6*    Recent Results (from the past 240 hour(s))  SARS Coronavirus 2 [facility] order, Performed in [facility] hospital lab) Nasopharyngeal Nasopharyngeal Swab     Status: Abnormal   Collection Time: 03/20/19  5:12 PM   Specimen: Nasopharyngeal Swab  Result Value Ref Range Status   SARS Coronavirus 2 POSITIVE (A) NEGATIVE Final    Comment: RESULT CALLED TO, READ BACK BY AND VERIFIED WITH: SONJIA WEAVER @2020  ON 03/20/2019 BY FMW (NOTE) If result is NEGATIVE SARS-CoV-2 target nucleic acids are NOT DETECTED. The SARS-CoV-2 RNA is generally detectable in upper and lower  respiratory specimens during the acute phase of infection. The lowest  concentration of SARS-CoV-2 viral copies this assay can detect is 250  copies / mL. A negative result does not preclude SARS-CoV-2 infection  and should not be used as the sole basis for treatment or other  patient management decisions.  A negative result may occur with  improper specimen collection / handling, submission of specimen other  than nasopharyngeal swab, presence of viral mutation(s) within the  areas targeted by this assay, and inadequate number of viral copies  (<250 copies / mL). A negative result must be combined with clinical  observations, patient history, and epidemiological information. If result is POSITIVE SARS-CoV-2 target nucleic acids are DETECTE D. The SARS-CoV-2 RNA is generally detectable in upper and lower  respiratory specimens during the acute phase of infection.  Positive  results are indicative of active infection with SARS-CoV-2.  Clinical  correlation with patient history and other diagnostic information is  necessary to determine patient infection status.  Positive results do  not rule out bacterial infection or co-infection with other viruses. If result is PRESUMPTIVE POSTIVE SARS-CoV-2 nucleic acids MAY BE PRESENT.   A presumptive positive result was obtained on the submitted specimen  and confirmed on repeat testing.  While 2019 novel coronavirus  (SARS-CoV-2) nucleic acids may be present in the submitted sample   additional confirmatory testing may be necessary for epidemiological  and / or clinical management purposes  to differentiate between  SARS-CoV-2 and other Sarbecovirus currently known to infect humans.  If clinically indicated additional testing with an alternate test  methodology (LAB745 3) is advised. The SARS-CoV-2 RNA is generally  detectable in upper and lower respiratory specimens during the acute  phase of infection. The expected result is Negative. Fact Sheet for Patients:  StrictlyIdeas.no Fact Sheet for Healthcare Providers: BankingDealers.co.za This test is not yet approved or cleared by the [place] FDA and has been authorized for detection and/or diagnosis of SARS-CoV-2 by FDA under an Emergency Use Authorization (EUA).  This EUA will remain in effect (meaning this test can be used) for the duration of the COVID-19 declaration under Section 564(b)(1) of the Act, 21 U.S.C. section 360bbb-3(b)(1), unless the authorization is terminated or revoked sooner. Performed at [facility], [address], [place], [place] [address]  Scheduled Meds: . enoxaparin (LOVENOX) injection  40 mg Subcutaneous Q24H  . mirabegron ER  50 mg Oral Daily   Continuous Infusions: . dextrose 5 % and 0.9% NaCl 60 mL/hr at 03/22/19 2125     LOS: 2 days   Cherene Altes, MD [facility] Office  [phone] Pager - Text Page per Shea Evans  If 7PM-7AM, please contact night-coverage per Amion 03/23/2019, 8:43 AM

## 2019-03-24 ENCOUNTER — Inpatient Hospital Stay (HOSPITAL_COMMUNITY): Payer: BC Managed Care – PPO

## 2019-03-24 LAB — CBC WITH DIFFERENTIAL/PLATELET
Abs Immature Granulocytes: 0.01 10*3/uL (ref 0.00–0.07)
Basophils Absolute: 0 10*3/uL (ref 0.0–0.1)
Basophils Relative: 0 %
Eosinophils Absolute: 0 10*3/uL (ref 0.0–0.5)
Eosinophils Relative: 1 %
HCT: 39.3 % (ref 36.0–46.0)
Hemoglobin: 12.6 g/dL (ref 12.0–15.0)
Immature Granulocytes: 0 %
Lymphocytes Relative: 39 %
Lymphs Abs: 1.4 10*3/uL (ref 0.7–4.0)
MCH: 28.6 pg (ref 26.0–34.0)
MCHC: 32.1 g/dL (ref 30.0–36.0)
MCV: 89.1 fL (ref 80.0–100.0)
Monocytes Absolute: 0.3 10*3/uL (ref 0.1–1.0)
Monocytes Relative: 9 %
Neutro Abs: 1.8 10*3/uL (ref 1.7–7.7)
Neutrophils Relative %: 51 %
Platelets: 131 10*3/uL — ABNORMAL LOW (ref 150–400)
RBC: 4.41 MIL/uL (ref 3.87–5.11)
RDW: 13.8 % (ref 11.5–15.5)
WBC: 3.6 10*3/uL — ABNORMAL LOW (ref 4.0–10.5)
nRBC: 0 % (ref 0.0–0.2)

## 2019-03-24 LAB — COMPREHENSIVE METABOLIC PANEL
ALT: 70 U/L — ABNORMAL HIGH (ref 0–44)
AST: 48 U/L — ABNORMAL HIGH (ref 15–41)
Albumin: 2.6 g/dL — ABNORMAL LOW (ref 3.5–5.0)
Alkaline Phosphatase: 97 U/L (ref 38–126)
Anion gap: 6 (ref 5–15)
BUN: 11 mg/dL (ref 6–20)
CO2: 24 mmol/L (ref 22–32)
Calcium: 8.4 mg/dL — ABNORMAL LOW (ref 8.9–10.3)
Chloride: 110 mmol/L (ref 98–111)
Creatinine, Ser: 0.61 mg/dL (ref 0.44–1.00)
GFR calc Af Amer: >60 mL/min (ref >60)
GFR calc non Af Amer: >60 mL/min (ref >60)
Glucose, Bld: 102 mg/dL — ABNORMAL HIGH (ref 70–99)
Potassium: 3.8 mmol/L (ref 3.5–5.1)
Sodium: 140 mmol/L (ref 135–145)
Total Bilirubin: 0.4 mg/dL (ref 0.3–1.2)
Total Protein: 5.1 g/dL — ABNORMAL LOW (ref 6.5–8.1)

## 2019-03-24 LAB — D-DIMER, QUANTITATIVE: D-Dimer, Quant: 0.38 ug/mL-FEU (ref 0.00–0.50)

## 2019-03-24 LAB — FERRITIN: Ferritin: 69 ng/mL (ref 11–307)

## 2019-03-24 LAB — C-REACTIVE PROTEIN: CRP: 3.1 mg/dL — ABNORMAL HIGH (ref <1.0)

## 2019-03-24 LAB — MAGNESIUM: Magnesium: 1.8 mg/dL (ref 1.7–2.4)

## 2019-03-24 MED ORDER — DEXAMETHASONE 6 MG PO TABS: 6.0000 mg | ORAL_TABLET | Freq: Every day | ORAL | 0 refills | Status: AC

## 2019-03-24 MED ORDER — DEXAMETHASONE SODIUM PHOSPHATE 10 MG/ML IJ SOLN
8.0000 mg | Freq: Once | INTRAMUSCULAR | Status: AC
  Administered 2019-03-24: 8 mg via INTRAVENOUS
  Filled 2019-03-24: qty 1

## 2019-03-24 NOTE — TOC Initial Note (Signed)
Transition of Care [facility]) - Initial/Assessment Note    Patient Details  Name: Sherry Hernandez MRN: [id number] Date of Birth: 1965/12/09  Transition of Care [facility]) CM/SW Contact:    Ninfa Meeker, RN Phone Number: [phone] (working remotely) 03/24/2019, 9:52 AM  Clinical Narrative:  53 yr old female admitted and treated for COVID 19. Thankfully she is improving and will discharge home. No Case Manager needs identified.                       Patient Goals and CMS Choice        Expected Discharge Plan and Services           Expected Discharge Date: 03/24/19                                    Prior Living Arrangements/Services                       Activities of Daily Living Home Assistive Devices/Equipment: None ADL Screening (condition at time of admission) Patient's cognitive ability adequate to safely complete daily activities?: Yes Is the patient deaf or have difficulty hearing?: No Does the patient have difficulty seeing, even when wearing glasses/contacts?: No Does the patient have difficulty concentrating, remembering, or making decisions?: No Patient able to express need for assistance with ADLs?: Yes Does the patient have difficulty dressing or bathing?: No Independently performs ADLs?: Yes (appropriate for developmental age) Does the patient have difficulty walking or climbing stairs?: No Weakness of Legs: None Weakness of Arms/Hands: None  Permission Sought/Granted                  Emotional Assessment              Admission diagnosis:  COVID-19 [U07.1, J98.8] Patient Active Problem List   Diagnosis Date Noted  . Gastroenteritis due to COVID-19 virus 03/21/2019  . Uncontrollable vomiting   . Intractable nausea and vomiting 07/22/2015   PCP:  Sharyne Peach, MD Pharmacy:   [facility] [address] [place] [address] [place] [address] Phone: [phone] Fax:  [phone]     Social Determinants of Health (SDOH) Interventions    Readmission Risk Interventions No flowsheet data found.

## 2019-03-24 NOTE — Care Management (Signed)
Case manager notified patient has no way home. Family not willing to transport. CM arranged for [facility] to transport home. She will be next available vehicle. Medical Necessity and face sheet printed to nursing unit, Charge nurse notified.    Ricki Miller, RN Case Manager  [phone]

## 2019-03-24 NOTE — Plan of Care (Signed)
Pt is being dc home in zero distress

## 2019-03-24 NOTE — Evaluation (Signed)
Physical Therapy Evaluation Patient Details Name: Sherry Hernandez MRN: [id number] DOB: 1966-04-21 Today's Date: 03/24/2019    History of Present Illness 53 y.o. former smoker w/ a hx of HTN and migraines who was diagnosed w/ COVID 9/8 after she developed fevers, HA, and N/V/D. She presented to the [facility] ED w/ unrelenting worsening N/V. She was afebrile, not SOB, and not hypoxic. She was given 2.5L NS and multiple doses of antiemetics, but her sx persisted and she was not able to tolerate oral intake    PT Comments    The patient is mobilizing very well. Patient is ready for DC.  Follow Up Recommendations  No PT follow up     Equipment Recommendations  None recommended by PT    Recommendations for Other Services       Precautions / Restrictions Precautions Precautions: None    Mobility  Bed Mobility Overal bed mobility: Independent                Transfers Overall transfer level: Independent                  Ambulation/Gait Ambulation/Gait assistance: Independent Gait Distance (Feet): 200 Feet Assistive device: None Gait Pattern/deviations: WFL(Within Functional Limits)   Gait velocity interpretation: >2.62 ft/sec, indicative of community ambulatory     Stairs             Wheelchair Mobility    Modified Rankin (Stroke Patients Only)       Balance                                            Cognition Arousal/Alertness: Awake/alert Behavior During Therapy: WFL for tasks assessed/performed Overall Cognitive Status: Within Functional Limits for tasks assessed                                        Exercises      General Comments        Pertinent Vitals/Pain      Home Living Family/patient expects to be discharged to:: Private residence Living Arrangements: Alone Available Help at Discharge: Family;Available PRN/intermittently Type of Home: House     Home Layout: One level Home Equipment:  None      Prior Function Level of Independence: Independent      Comments: works   PT Goals (current goals can now be found in the care plan section) Acute Rehab PT Goals Patient Stated Goal: go home PT Goal Formulation: All assessment and education complete, DC therapy    Frequency           PT Plan      Co-evaluation              AM-PAC PT "6 Clicks" Mobility   Outcome Measure  Help needed turning from your back to your side while in a flat bed without using bedrails?: None Help needed moving from lying on your back to sitting on the side of a flat bed without using bedrails?: None Help needed moving to and from a bed to a chair (including a wheelchair)?: None Help needed standing up from a chair using your arms (e.g., wheelchair or bedside chair)?: None Help needed to walk in hospital room?: None Help needed climbing 3-5 steps with a railing? : None 6  Click Score: 24    End of Session   Activity Tolerance: Patient tolerated treatment well Patient left: in chair Nurse Communication: Mobility status PT Visit Diagnosis: Difficulty in walking, not elsewhere classified (R26.2)     Time: [id number] PT Time Calculation (min) (ACUTE ONLY): 47 min  Charges:  $Gait Training: 8-22 mins $Self Care/Home Management: [place] Pager [phone] Office [phone]    Claretha Cooper 03/24/2019, 4:50 PM

## 2019-03-24 NOTE — Discharge Summary (Signed)
Physician Discharge Summary  Sherry Hernandez [id number] DOB: 1966-01-01 DOA: 03/21/2019  PCP: Sharyne Peach, MD  Admit date: 03/21/2019 Discharge date: 03/24/2019  Admitted From: Home Disposition: Home   Recommendations for Outpatient Follow-up:  1. Follow up with PCP in 2 weeks with repeat CBC, CMP 2. Recommend pelvic ultrasound in 3-6 months to follow up left adnexal cystic structure seen on CT.   Home Health: None Equipment/Devices: None Discharge Condition: Stable CODE STATUS: Full Diet recommendation: Heart healthy  Brief/Interim Summary: Sherry Hernandez is a 53 y.o.former smoker w/ a hx of HTN and migraines who was diagnosed w/ COVID 9/8 after she developed fevers, HA, and N/V/D. She presented to the [facility] ED 9/13 with unrelenting worsening N/V. She was afebrile, not SOB, and not hypoxic. She was given 2.5L NS and multiple doses of antiemetics, but her symptoms persisted and she was not able to tolerate oral intake. She was admitted to [facility] and eventually improved with steroids and supportive measures.  Discharge Diagnoses:  Active Problems:   Gastroenteritis due to COVID-19 virus  SevereCOVID gastroenteritis: Improving, able to tolerate po for >24 hours without IV therapy needed.    COVIDpneumonia: With possible R apical infiltrate post hydration on her CXR 9/14. CXR repeated on day of discharge showed no further infiltrate. - Continue steroids at home in isolation per recommendations below.  LFT elevation: mild. Could be due to viral infection alone. - Repeat CMP at follow up.   4.3cmL adnexal cystic structure: Discussed with patient on day of discharge. - Will need follow-up ultrasound in 3-35months per Radiology recommendation  HTN:  - No changes to medications.  Obesity: BMI 34.   Discharge Instructions Discharge Instructions    Diet - low sodium heart healthy   Complete by: As directed    Discharge instructions   Complete by: As directed    You  are being discharged from the hospital after treatment for covid-19 infection. You are felt to be stable enough to no longer require inpatient monitoring, testing, and treatment, though you will need to follow the recommendations below: - Continue taking decadron 6mg  tablet once a day, starting tomorrow morning for the next week.  - Remain in self-isolation for 14 days following discharge to reduce risk of transmission of the virus.  - Do not take NSAID medications (including, but not limited to, ibuprofen, advil, motrin, naproxen, aleve, goody's powder, etc.) - Follow up with your doctor in the next week via telehealth or seek medical attention right away if your symptoms get WORSE.  - Consider donating plasma after you have recovered (either 14 days after a negative test or 28 days after symptoms have completely resolved) because your antibodies to this virus may be helpful to give to others with life-threatening infections. Please go to the website www.[email or web address] if you would like to consider volunteering for plasma donation.    Directions for you at home:  Wear a facemask You should wear a facemask that covers your nose and mouth when you are in the same room with other people and when you visit a healthcare provider. People who live with or visit you should also wear a facemask while they are in the same room with you.  Separate yourself from other people in your home As much as possible, you should stay in a different room from other people in your home. Also, you should use a separate bathroom, if available.  Avoid sharing household items You should not share dishes, drinking glasses, cups,  eating utensils, towels, bedding, or other items with other people in your home. After using these items, you should wash them thoroughly with soap and water.  Cover your coughs and sneezes Cover your mouth and nose with a tissue when you cough or sneeze, or you can cough or sneeze into your  sleeve. Throw used tissues in a lined trash can, and immediately wash your hands with soap and water for at least 20 seconds or use an alcohol-based hand rub.  Wash your [facility] your hands often and thoroughly with soap and water for at least 20 seconds. You can use an alcohol-based hand sanitizer if soap and water are not available and if your hands are not visibly dirty. Avoid touching your eyes, nose, and mouth with unwashed hands.  Directions for those who live with, or provide care at home for you:  Limit the number of people who have contact with the patient If possible, have only one caregiver for the patient. Other household members should stay in another home or place of residence. If this is not possible, they should stay in another room, or be separated from the patient as much as possible. Use a separate bathroom, if available. Restrict visitors who do not have an essential need to be in the home.  Ensure good ventilation Make sure that shared spaces in the home have good air flow, such as from an air conditioner or an opened window, weather permitting.  Wash your hands often Wash your hands often and thoroughly with soap and water for at least 20 seconds. You can use an alcohol based hand sanitizer if soap and water are not available and if your hands are not visibly dirty. Avoid touching your eyes, nose, and mouth with unwashed hands. Use disposable paper towels to dry your hands. If not available, use dedicated cloth towels and replace them when they become wet.  Wear a facemask and gloves Wear a disposable facemask at all times in the room and gloves when you touch or have contact with the patient's blood, body fluids, and/or secretions or excretions, such as sweat, saliva, sputum, nasal mucus, vomit, urine, or feces.  Ensure the mask fits over your nose and mouth tightly, and do not touch it during use. Throw out disposable facemasks and gloves after using them. Do not  reuse. Wash your hands immediately after removing your facemask and gloves. If your personal clothing becomes contaminated, carefully remove clothing and launder. Wash your hands after handling contaminated clothing. Place all used disposable facemasks, gloves, and other waste in a lined container before disposing them with other household waste. Remove gloves and wash your hands immediately after handling these items.  Do not share dishes, glasses, or other household items with the patient Avoid sharing household items. You should not share dishes, drinking glasses, cups, eating utensils, towels, bedding, or other items with a patient who is confirmed to have, or being evaluated for, COVID-19 infection. After the person uses these items, you should wash them thoroughly with soap and water.  Wash laundry thoroughly Immediately remove and wash clothes or bedding that have blood, body fluids, and/or secretions or excretions, such as sweat, saliva, sputum, nasal mucus, vomit, urine, or feces, on them. Wear gloves when handling laundry from the patient. Read and follow directions on labels of laundry or clothing items and detergent. In general, wash and dry with the warmest temperatures recommended on the label.  Clean all areas the individual has used often Clean all touchable  surfaces, such as counters, tabletops, doorknobs, bathroom fixtures, toilets, phones, keyboards, tablets, and bedside tables, every day. Also, clean any surfaces that may have blood, body fluids, and/or secretions or excretions on them. Wear gloves when cleaning surfaces the patient has come in contact with. Use a diluted bleach solution (e.g., dilute bleach with 1 part bleach and 10 parts water) or a household disinfectant with a label that says EPA-registered for coronaviruses. To make a bleach solution at home, add 1 tablespoon of bleach to 1 quart (4 cups) of water. For a larger supply, add  cup of bleach to 1 gallon (16  cups) of water. Read labels of cleaning products and follow recommendations provided on product labels. Labels contain instructions for safe and effective use of the cleaning product including precautions you should take when applying the product, such as wearing gloves or eye protection and making sure you have good ventilation during use of the product. Remove gloves and wash hands immediately after cleaning.  Monitor yourself for signs and symptoms of illness Caregivers and household members are considered close contacts, should monitor their health, and will be asked to limit movement outside of the home to the extent possible. Follow the monitoring steps for close contacts listed on the symptom monitoring form.  If you have additional questions, contact your local health department or call the epidemiologist on call at [phone] (available 24/7). This guidance is subject to change. For the most up-to-date guidance from [facility], please refer to their website: YouBlogs.pl     Allergies as of 03/24/2019   No Known Allergies     Medication List    STOP taking these medications   amoxicillin 875 MG tablet Commonly known as: AMOXIL   benzonatate 100 MG capsule Commonly known as: Tessalon Perles   cyclobenzaprine 5 MG tablet Commonly known as: Flexeril   dicyclomine 10 MG capsule Commonly known as: Bentyl   furosemide 20 MG tablet Commonly known as: Lasix   mirabegron ER 50 MG Tb24 tablet Commonly known as: MYRBETRIQ   naproxen 500 MG EC tablet Commonly known as: EC NAPROSYN   ondansetron 4 MG tablet Commonly known as: Zofran   zolpidem 10 MG tablet Commonly known as: Ambien     TAKE these medications   acetaminophen 500 MG tablet Commonly known as: TYLENOL Take 1,000 mg by mouth every 6 (six) hours as needed for mild pain or headache.   brompheniramine-pseudoephedrine-DM 30-2-10 MG/5ML syrup Take 10 mLs  by mouth every 4 (four) hours as needed for cough.   dexamethasone 6 MG tablet Commonly known as: Decadron Take 1 tablet (6 mg total) by mouth daily for 6 days.   lisinopril-hydrochlorothiazide 10-12.5 MG tablet Commonly known as: ZESTORETIC Take 1 tablet by mouth daily.   multivitamin-iron-minerals-folic acid chewable tablet Chew 1 tablet by mouth daily.   traZODone 50 MG tablet Commonly known as: DESYREL Take 50 mg by mouth at bedtime as needed for sleep. New RX   Vitamin D (Ergocalciferol) 1.25 MG (50000 UT) Caps capsule Commonly known as: DRISDOL Take 50,000 Units by mouth once a week. X 30 days start 03-11-19 still at [place], [place]. Schedule an appointment as soon as possible for a visit in 2 week(s).   Specialty: Family Medicine Contact information: [place] [address] [phone]          No Known Allergies  Consultations:  None  Procedures/Studies: Ct Abdomen Pelvis W  Contrast  Result Date: 03/20/2019 CLINICAL DATA:  Evaluate for ileus. Nausea and vomiting. COVID-19 positive. EXAM: CT ABDOMEN AND PELVIS WITH CONTRAST TECHNIQUE: Multidetector CT imaging of the abdomen and pelvis was performed using the standard protocol following bolus administration of intravenous contrast. CONTRAST:  171mL OMNIPAQUE IOHEXOL 300 MG/ML  SOLN COMPARISON:  CT abdomen pelvis 07/22/2015 FINDINGS: Lower chest: Normal heart size. Dependent atelectasis within the bilateral lower lobes. No pleural effusion. Hepatobiliary: Liver is normal in size and contour. Fatty deposition adjacent to the falciform ligament. Prior cholecystectomy. No intrahepatic or extrahepatic biliary ductal dilatation. Pancreas: Unremarkable Spleen: Unremarkable Adrenals/Urinary Tract: Normal adrenal glands. Kidneys enhance symmetrically with contrast. No hydronephrosis. Urinary bladder is unremarkable. Stomach/Bowel: Normal morphology of the stomach. No  evidence for small bowel obstruction. No free fluid or free intraperitoneal air. Normal appendix. There is mild wall thickening of the colon, particularly the descending and sigmoid colon. Vascular/Lymphatic: Normal caliber abdominal aorta. No retroperitoneal lymphadenopathy. Reproductive: Prior hysterectomy. Similar-appearing 4.3 cm left adnexal cystic structure. Prominent follicles within the right ovary. Other: None. Musculoskeletal: No aggressive or acute appearing osseous lesions. IMPRESSION: 1. There is mild wall thickening of the colon, particularly the descending and sigmoid colon as can be seen with colitis in the appropriate clinical setting. 2. No evidence for small bowel obstruction. 3. Similar-appearing 4.3 cm left adnexal cystic structure. Recommend follow-up pelvic ultrasound in 3-6 months to ensure stability. Electronically Signed   By: Lovey Newcomer M.D.   On: 03/20/2019 19:12   Dg Chest Port 1 View  Result Date: 03/22/2019 CLINICAL DATA:  Gastroenteritis due to COVID-19 EXAM: PORTABLE CHEST 1 VIEW COMPARISON:  Two days ago FINDINGS: Possible reticulonodular density in the right upper lobe. Lung volumes are low. Borderline heart size with negative aortic contours. IMPRESSION: Possible early infiltrate in the right upper lobe. Electronically Signed   By: Monte Fantasia M.D.   On: 03/22/2019 08:19   Dg Chest Port 1 View  Result Date: 03/20/2019 CLINICAL DATA:  COVID-19 positive last Tuesday. Nausea, vomiting and diarrhea with fever and headache. EXAM: PORTABLE CHEST 1 VIEW COMPARISON:  01/13/2017 FINDINGS: Lungs are adequately inflated without lobar consolidation or effusion. Cardiomediastinal silhouette and remainder of the exam is unchanged. IMPRESSION: No acute cardiopulmonary disease. Electronically Signed   By: Marin Olp M.D.   On: 03/20/2019 14:57   Dg Abd 2 Views  Result Date: 03/20/2019 CLINICAL DATA:  Patient with nausea and vomiting. COVID-19 positive. EXAM: ABDOMEN - 2 VIEW  COMPARISON:  CT abdomen pelvis 07/22/2015 FINDINGS: Lung bases are clear. Gaseous distended loops of small bowel are demonstrated within the central abdomen. Gas is present within the colon. Cholecystectomy clips. Lower thoracic and lumbar spine degenerative changes. Pelvic phleboliths. SI joints unremarkable. IMPRESSION: Gaseous distended loops of small bowel within the central abdomen with distal colonic gas raising the possibility of ileus. Electronically Signed   By: Lovey Newcomer M.D.   On: 03/20/2019 17:17      Subjective: Feels well, moving around without dyspnea. Eating better, no nausea or vomiting.   Discharge Exam: Vitals:   03/24/19 0500 03/24/19 0800  BP:  (!) 141/65  Pulse:  78  Resp: 20 20  Temp: 98.9 F (37.2 C) 98.4 F (36.9 C)  SpO2:     General: Pt is alert, awake, not in acute distress Cardiovascular: RRR, S1/S2 +, no rubs, no gallops Respiratory: CTA bilaterally, no wheezing, no rhonchi Abdominal: Soft, NT, ND, bowel sounds + Extremities: No edema, no cyanosis  Labs: BNP (last 3  results) No results for input(s): BNP in the last 8760 hours. Basic Metabolic Panel: Recent Labs  Lab 03/20/19 1540 03/22/19 0405 03/23/19 0325 03/24/19 0246  NA 141 141 141 140  K 3.9 3.8 3.7 3.8  CL 108 108 108 110  CO2 25 27 27 24   GLUCOSE 96 99 102* 102*  BUN 19 10 7 11   CREATININE 0.74 0.82 0.76 0.61  CALCIUM 8.8* 8.4* 8.4* 8.4*  MG  --  1.9 1.8 1.8   Liver Function Tests: Recent Labs  Lab 03/20/19 1540 03/22/19 0405 03/23/19 0325 03/24/19 0246  AST 25 22 50* 48*  ALT 25 24 51* 70*  ALKPHOS 90 84 93 97  BILITOT 0.4 0.5 0.3 0.4  PROT 6.0* 5.5* 5.2* 5.1*  ALBUMIN 3.1* 2.6* 2.6* 2.6*   No results for input(s): LIPASE, AMYLASE in the last 168 hours. No results for input(s): AMMONIA in the last 168 hours. CBC: Recent Labs  Lab 03/20/19 1540 03/22/19 0405 03/23/19 0325 03/24/19 0246  WBC 3.3* 2.5* 3.0* 3.6*  NEUTROABS 1.7 1.1* 1.6* 1.8  HGB 14.6 12.9 13.0  12.6  HCT 44.9 40.8 40.6 39.3  MCV 88.0 91.3 91.0 89.1  PLT 158 126* 119* 131*   Cardiac Enzymes: No results for input(s): CKTOTAL, CKMB, CKMBINDEX, TROPONINI in the last 168 hours. BNP: Invalid input(s): POCBNP CBG: No results for input(s): GLUCAP in the last 168 hours. D-Dimer Recent Labs    03/23/19 0325 03/24/19 0246  DDIMER 0.31 0.38   Hgb A1c No results for input(s): HGBA1C in the last 72 hours. Lipid Profile No results for input(s): CHOL, HDL, LDLCALC, TRIG, CHOLHDL, LDLDIRECT in the last 72 hours. Thyroid function studies No results for input(s): TSH, T4TOTAL, T3FREE, THYROIDAB in the last 72 hours.  Invalid input(s): FREET3 Anemia work up Recent Labs    03/23/19 0325 03/24/19 0246  FERRITIN 70 69   Urinalysis    Component Value Date/Time   COLORURINE YELLOW (A) 03/20/2019 1953   APPEARANCEUR CLEAR (A) 03/20/2019 1953   APPEARANCEUR Clear 04/08/2013 2258   LABSPEC 1.029 03/20/2019 1953   LABSPEC 1.009 04/08/2013 2258   PHURINE 5.0 03/20/2019 1953   GLUCOSEU NEGATIVE 03/20/2019 1953   GLUCOSEU Negative 04/08/2013 2258   HGBUR NEGATIVE 03/20/2019 1953   BILIRUBINUR NEGATIVE 03/20/2019 1953   BILIRUBINUR Negative 04/08/2013 2258   [place] 03/20/2019 1953   PROTEINUR NEGATIVE 03/20/2019 1953   NITRITE NEGATIVE 03/20/2019 1953   LEUKOCYTESUR NEGATIVE 03/20/2019 1953   LEUKOCYTESUR Negative 04/08/2013 2258    Microbiology Recent Results (from the past 240 hour(s))  SARS Coronavirus 2 [facility] order, Performed in [facility] hospital lab) Nasopharyngeal Nasopharyngeal Swab     Status: Abnormal   Collection Time: 03/20/19  5:12 PM   Specimen: Nasopharyngeal Swab  Result Value Ref Range Status   SARS Coronavirus 2 POSITIVE (A) NEGATIVE Final    Comment: RESULT CALLED TO, READ BACK BY AND VERIFIED WITH: SONJIA WEAVER @2020  ON 03/20/2019 BY FMW (NOTE) If result is NEGATIVE SARS-CoV-2 target nucleic acids are NOT DETECTED. The SARS-CoV-2 RNA  is generally detectable in upper and lower  respiratory specimens during the acute phase of infection. The lowest  concentration of SARS-CoV-2 viral copies this assay can detect is 250  copies / mL. A negative result does not preclude SARS-CoV-2 infection  and should not be used as the sole basis for treatment or other  patient management decisions.  A negative result may occur with  improper specimen collection / handling, submission of specimen  other  than nasopharyngeal swab, presence of viral mutation(s) within the  areas targeted by this assay, and inadequate number of viral copies  (<250 copies / mL). A negative result must be combined with clinical  observations, patient history, and epidemiological information. If result is POSITIVE SARS-CoV-2 target nucleic acids are DETECTE D. The SARS-CoV-2 RNA is generally detectable in upper and lower  respiratory specimens during the acute phase of infection.  Positive  results are indicative of active infection with SARS-CoV-2.  Clinical  correlation with patient history and other diagnostic information is  necessary to determine patient infection status.  Positive results do  not rule out bacterial infection or co-infection with other viruses. If result is PRESUMPTIVE POSTIVE SARS-CoV-2 nucleic acids MAY BE PRESENT.   A presumptive positive result was obtained on the submitted specimen  and confirmed on repeat testing.  While 2019 novel coronavirus  (SARS-CoV-2) nucleic acids may be present in the submitted sample  additional confirmatory testing may be necessary for epidemiological  and / or clinical management purposes  to differentiate between  SARS-CoV-2 and other Sarbecovirus currently known to infect humans.  If clinically indicated additional testing with an alternate test  methodology (LAB745 3) is advised. The SARS-CoV-2 RNA is generally  detectable in upper and lower respiratory specimens during the acute  phase of  infection. The expected result is Negative. Fact Sheet for Patients:  StrictlyIdeas.no Fact Sheet for Healthcare Providers: BankingDealers.co.za This test is not yet approved or cleared by the [place] FDA and has been authorized for detection and/or diagnosis of SARS-CoV-2 by FDA under an Emergency Use Authorization (EUA).  This EUA will remain in effect (meaning this test can be used) for the duration of the COVID-19 declaration under Section 564(b)(1) of the Act, 21 U.S.C. section 360bbb-3(b)(1), unless the authorization is terminated or revoked sooner. Performed at [facility], [address], [place], [place] [address]     Time coordinating discharge: Approximately 40 minutes  Patrecia Pour, MD  [facility] 03/24/2019, 8:54 AM

## 2019-03-24 NOTE — Progress Notes (Addendum)
0700 bedside report received, board updated  0800 assessment and vs taken and charted, gave fresh ice water  0900 gave morning meds, talked to pt about dc instructions and about staying prone at home until she is better  1000 removed IV, updated charge nurse on the pt discharge ride home  1100 pt is dressed and ready to go, talking to case management about ride home   Pt d/c home in zero distress

## 2019-03-24 NOTE — Progress Notes (Signed)
OT Cancellation Note  Patient Details Name: Massiah Kubilus MRN: [id number] DOB: 1966-02-08   Cancelled Treatment:    Reason Eval/Treat Not Completed: OT screened, no needs identified, will sign off  Dmitri Pettigrew,HILLARY 03/24/2019, 10:11 AM  Maurie Boettcher, OT/L   Acute OT Clinical Specialist Acute Rehabilitation Services Pager [phone] Office [phone]

## 2019-03-24 NOTE — Progress Notes (Signed)
Pt is being dc home in zero distress

## 2019-04-08 ENCOUNTER — Encounter (HOSPITAL_COMMUNITY): Payer: Self-pay | Admitting: *Deleted

## 2019-04-27 ENCOUNTER — Other Ambulatory Visit: Payer: Self-pay | Admitting: Family Medicine

## 2019-04-27 DIAGNOSIS — J1289 Other viral pneumonia: Secondary | ICD-10-CM

## 2019-04-27 DIAGNOSIS — N949 Unspecified condition associated with female genital organs and menstrual cycle: Secondary | ICD-10-CM

## 2019-04-27 DIAGNOSIS — U071 COVID-19: Secondary | ICD-10-CM

## 2019-04-27 DIAGNOSIS — J1282 Pneumonia due to coronavirus disease 2019: Secondary | ICD-10-CM

## 2019-05-05 ENCOUNTER — Ambulatory Visit: Payer: BC Managed Care – PPO

## 2019-05-14 ENCOUNTER — Ambulatory Visit
Admission: RE | Admit: 2019-05-14 | Discharge: 2019-05-14 | Disposition: A | Discharge status: Home / Self Care | Payer: BC Managed Care – PPO | Source: Ambulatory Visit | Attending: Family Medicine | Admitting: Family Medicine

## 2019-05-14 DIAGNOSIS — Z1231 Encounter for screening mammogram for malignant neoplasm of breast: Secondary | ICD-10-CM | POA: Diagnosis not present

## 2019-06-01 ENCOUNTER — Ambulatory Visit
Admission: RE | Admit: 2019-06-01 | Discharge: 2019-06-01 | Disposition: A | Discharge status: Home / Self Care | Payer: BC Managed Care – PPO | Source: Ambulatory Visit | Attending: Family Medicine | Admitting: Family Medicine

## 2019-06-01 ENCOUNTER — Other Ambulatory Visit: Payer: Self-pay

## 2019-06-01 DIAGNOSIS — U071 COVID-19: Secondary | ICD-10-CM | POA: Diagnosis present

## 2019-06-01 DIAGNOSIS — J1289 Other viral pneumonia: Secondary | ICD-10-CM | POA: Diagnosis present

## 2019-06-01 DIAGNOSIS — N949 Unspecified condition associated with female genital organs and menstrual cycle: Secondary | ICD-10-CM | POA: Insufficient documentation

## 2019-07-12 ENCOUNTER — Other Ambulatory Visit: Payer: BC Managed Care – PPO

## 2019-07-17 ENCOUNTER — Other Ambulatory Visit: Payer: Self-pay

## 2019-07-17 DIAGNOSIS — Z79899 Other long term (current) drug therapy: Secondary | ICD-10-CM | POA: Diagnosis not present

## 2019-07-17 DIAGNOSIS — I10 Essential (primary) hypertension: Secondary | ICD-10-CM | POA: Diagnosis not present

## 2019-07-17 DIAGNOSIS — R102 Pelvic and perineal pain: Secondary | ICD-10-CM | POA: Diagnosis not present

## 2019-07-17 DIAGNOSIS — Z87891 Personal history of nicotine dependence: Secondary | ICD-10-CM | POA: Insufficient documentation

## 2019-07-17 DIAGNOSIS — N83202 Unspecified ovarian cyst, left side: Secondary | ICD-10-CM | POA: Diagnosis not present

## 2019-07-17 LAB — CBC
HCT: 37.8 % (ref 36.0–46.0)
Hemoglobin: 12.4 g/dL (ref 12.0–15.0)
MCH: 28.7 pg (ref 26.0–34.0)
MCHC: 32.8 g/dL (ref 30.0–36.0)
MCV: 87.5 fL (ref 80.0–100.0)
Platelets: 188 10*3/uL (ref 150–400)
RBC: 4.32 MIL/uL (ref 3.87–5.11)
RDW: 13.9 % (ref 11.5–15.5)
WBC: 6 10*3/uL (ref 4.0–10.5)
nRBC: 0 % (ref 0.0–0.2)

## 2019-07-17 NOTE — ED Triage Notes (Signed)
Patient reports having pelvic pain that started yesterday.  Reports history of ovarian cyst in past.

## 2019-07-18 ENCOUNTER — Emergency Department
Admission: EM | Admit: 2019-07-18 | Discharge: 2019-07-18 | Disposition: A | Discharge status: Home / Self Care | Payer: BC Managed Care – PPO | Attending: Emergency Medicine | Admitting: Emergency Medicine

## 2019-07-18 ENCOUNTER — Encounter: Payer: Self-pay | Admitting: Radiology

## 2019-07-18 ENCOUNTER — Emergency Department: Payer: BC Managed Care – PPO

## 2019-07-18 DIAGNOSIS — R102 Pelvic and perineal pain: Secondary | ICD-10-CM

## 2019-07-18 DIAGNOSIS — R109 Unspecified abdominal pain: Secondary | ICD-10-CM

## 2019-07-18 DIAGNOSIS — N83202 Unspecified ovarian cyst, left side: Secondary | ICD-10-CM

## 2019-07-18 LAB — COMPREHENSIVE METABOLIC PANEL
ALT: 40 U/L (ref 0–44)
AST: 29 U/L (ref 15–41)
Albumin: 3.9 g/dL (ref 3.5–5.0)
Alkaline Phosphatase: 74 U/L (ref 38–126)
Anion gap: 12 (ref 5–15)
BUN: 21 mg/dL — ABNORMAL HIGH (ref 6–20)
CO2: 24 mmol/L (ref 22–32)
Calcium: 9.9 mg/dL (ref 8.9–10.3)
Chloride: 103 mmol/L (ref 98–111)
Creatinine, Ser: 0.87 mg/dL (ref 0.44–1.00)
GFR calc Af Amer: >60 mL/min (ref >60)
GFR calc non Af Amer: >60 mL/min (ref >60)
Glucose, Bld: 98 mg/dL (ref 70–99)
Potassium: 3.7 mmol/L (ref 3.5–5.1)
Sodium: 139 mmol/L (ref 135–145)
Total Bilirubin: 0.6 mg/dL (ref 0.3–1.2)
Total Protein: 6.8 g/dL (ref 6.5–8.1)

## 2019-07-18 LAB — LIPASE, BLOOD: Lipase: 47 U/L (ref 11–51)

## 2019-07-18 LAB — URINALYSIS, COMPLETE (UACMP) WITH MICROSCOPIC
Bacteria, UA: NONE SEEN
Bilirubin Urine: NEGATIVE
Glucose, UA: NEGATIVE mg/dL
Hgb urine dipstick: NEGATIVE
Ketones, ur: NEGATIVE mg/dL
Nitrite: NEGATIVE
Protein, ur: NEGATIVE mg/dL
Specific Gravity, Urine: 1.003 — ABNORMAL LOW (ref 1.005–1.030)
Squamous Epithelial / HPF: NONE SEEN (ref 0–5)
pH: 6 (ref 5.0–8.0)

## 2019-07-18 MED ORDER — ONDANSETRON HCL 4 MG/2ML IJ SOLN
4.0000 mg | Freq: Once | INTRAMUSCULAR | Status: AC
  Administered 2019-07-18: 06:00:00 4 mg via INTRAVENOUS
  Filled 2019-07-18: qty 2

## 2019-07-18 MED ORDER — MORPHINE SULFATE (PF) 2 MG/ML IV SOLN
2.0000 mg | Freq: Once | INTRAVENOUS | Status: AC
  Administered 2019-07-18: 06:00:00 2 mg via INTRAVENOUS
  Filled 2019-07-18: qty 1

## 2019-07-18 MED ORDER — IOHEXOL 300 MG/ML  SOLN
125.0000 mL | Freq: Once | INTRAMUSCULAR | Status: AC | PRN
  Administered 2019-07-18: 07:00:00 125 mL via INTRAVENOUS

## 2019-07-18 NOTE — ED Provider Notes (Addendum)
[facility] Emergency Department Provider Note  ____________________________________________   First MD Initiated Contact with Patient 07/18/19 [phone]     (approximate)  I have reviewed the triage vital signs and the nursing notes.   HISTORY  Chief Complaint Abdominal Pain    HPI Sherry Hernandez is a 54 y.o. female with below list of previous medical conditions including recently diagnosed ovarian cyst presents to the emergency department secondary to pelvic pain which patient states is currently 10 out of 10.  Patient states that the pain began yesterday and is progressively worsened since onset.  Patient denies any fever no nausea or vomiting.  Patient denies any diarrhea or constipation.  Patient states that she was seen by her noted clinic secondary to the ovarian cyst with follow-up appointment scheduled.       Past Medical History:  Diagnosis Date  . Dysuria   . Frequency   . HTN (hypertension)   . Obesity   . Overactive bladder   . Sensory urge incontinence   . Stress incontinence     Patient Active Problem List   Diagnosis Date Noted  . Pneumonia due to COVID-19 virus 03/21/2019  . Uncontrollable vomiting   . Intractable nausea and vomiting 07/22/2015    Past Surgical History:  Procedure Laterality Date  . ABDOMINAL HYSTERECTOMY    . COLONOSCOPY WITH PROPOFOL N/A 03/24/2015   Procedure: COLONOSCOPY WITH PROPOFOL;  Surgeon: Josefine Class, MD;  Location: [facility] ENDOSCOPY;  Service: Endoscopy;  Laterality: N/A;    Prior to Admission medications   Medication Sig Start Date End Date Taking? Authorizing Provider  acetaminophen (TYLENOL) 500 MG tablet Take 1,000 mg by mouth every 6 (six) hours as needed for mild pain or headache.    [provider]  brompheniramine-pseudoephedrine-DM 30-2-10 MG/5ML syrup Take 10 mLs by mouth every 4 (four) hours as needed for cough. 03/16/19   [provider]    lisinopril-hydrochlorothiazide (PRINZIDE,ZESTORETIC) 10-12.5 MG per tablet Take 1 tablet by mouth daily.    [provider]  multivitamin-iron-minerals-folic acid (CENTRUM) chewable tablet Chew 1 tablet by mouth daily.    [provider]  traZODone (DESYREL) 50 MG tablet Take 50 mg by mouth at bedtime as needed for sleep. New RX 08/04/18 08/04/19  [provider]  Vitamin D, Ergocalciferol, (DRISDOL) 1.25 MG (50000 UT) CAPS capsule Take 50,000 Units by mouth once a week. X 30 days start 03-11-19 still at pharmacy 11/11/18   [provider]    Allergies Patient has no known allergies.  Family History  Problem Relation Age of Onset  . Cancer Father   . Cancer Mother   . Breast cancer Mother 54  . Cancer Sister   . Breast cancer Sister     Social History Social History   Tobacco Use  . Smoking status: Former Smoker    Years: 7.00    Quit date: 09/20/2007    Years since quitting: 11.8  . Smokeless tobacco: Never Used  Substance Use Topics  . Alcohol use: No    Alcohol/week: 0.0 standard drinks  . Drug use: No    Review of Systems Constitutional: No fever/chills Eyes: No visual changes. ENT: No sore throat. Cardiovascular: Denies chest pain. Respiratory: Denies shortness of breath. Gastrointestinal: No abdominal pain.  No nausea, no vomiting.  No diarrhea.  No constipation. Genitourinary: Negative for dysuria.  Positive for pelvic pain Musculoskeletal: Negative for neck pain.  Negative for back pain. Integumentary: Negative for rash. Neurological: Negative  for headaches, focal weakness or numbness.   ____________________________________________   PHYSICAL EXAM:  VITAL SIGNS: ED Triage Vitals  Enc Vitals Group     BP 07/17/19 2332 (!) 154/64     Pulse Rate 07/17/19 2332 74     Resp 07/17/19 2332 17     Temp 07/17/19 2332 98.7 F (37.1 C)     Temp Source 07/17/19 2332 Oral     SpO2 07/17/19 2332 99 %     Weight 07/17/19 2329 113.4  kg (250 lb)     Height 07/17/19 2329 1.676 m (5\' 6" )     Head Circumference --      Peak Flow --      Pain Score 07/17/19 2329 10     Pain Loc --      Pain Edu? --      Excl. in [place]? --     Constitutional: Alert and oriented.  Apparent discomfort Eyes: Conjunctivae are normal.  Mouth/Throat: Patient is wearing a mask. Neck: No stridor.  No meningeal signs.   Cardiovascular: Normal rate, regular rhythm. Good peripheral circulation. Grossly normal heart sounds. Respiratory: Normal respiratory effort.  No retractions. Gastrointestinal: Soft and nontender. No distention.  Musculoskeletal: No lower extremity tenderness nor edema. No gross deformities of extremities. Neurologic:  Normal speech and language. No gross focal neurologic deficits are appreciated.  Skin:  Skin is warm, dry and intact. Psychiatric: Mood and affect are normal. Speech and behavior are normal.  ____________________________________________   LABS (all labs ordered are listed, but only abnormal results are displayed)  Labs Reviewed  COMPREHENSIVE METABOLIC PANEL - Abnormal; Notable for the following components:      Result Value   BUN 21 (*)    All other components within normal limits  URINALYSIS, COMPLETE (UACMP) WITH MICROSCOPIC - Abnormal; Notable for the following components:   Color, Urine COLORLESS (*)    APPearance CLEAR (*)    Specific Gravity, Urine 1.003 (*)    Leukocytes,Ua TRACE (*)    All other components within normal limits  LIPASE, BLOOD  CBC   ______________________  RADIOLOGY I, [place] N Mcgwire Dasaro, personally viewed and evaluated these images (plain radiographs) as part of my medical decision making, as well as reviewing the written report by the radiologist.  ED MD interpretation:   Ultrasound revealed 3.8 cm ovarian cyst without any evidence of torsion.  CT abdomen pelvis revealed 4.3 cm benign-appearing left ovarian cyst per radiologist.  Official radiology report(s): CT ABDOMEN  PELVIS W CONTRAST  Result Date: 07/18/2019 CLINICAL DATA:  Pain in the lower vaginal region. Prior hysterectomy and cholecystectomy. Left lower quadrant pain. EXAM: CT ABDOMEN AND PELVIS WITH CONTRAST TECHNIQUE: Multidetector CT imaging of the abdomen and pelvis was performed using the standard protocol following bolus administration of intravenous contrast. CONTRAST:  118mL OMNIPAQUE IOHEXOL 300 MG/ML  SOLN COMPARISON:  03/20/2019 FINDINGS: Lower chest: Unremarkable. Hepatobiliary: No suspicious focal abnormality within the liver parenchyma. Gallbladder is surgically absent. No intrahepatic or extrahepatic biliary dilation. Pancreas: No focal mass lesion. No dilatation of the main duct. No intraparenchymal cyst. No peripancreatic edema. Spleen: No splenomegaly. No focal mass lesion. Adrenals/Urinary Tract: No adrenal nodule or mass. Right kidney unremarkable. Stable 8 mm low-density lesion upper pole left kidney measuring fat attenuation, compatible with benign angiomyolipoma. No evidence for hydroureter. The urinary bladder appears normal for the degree of distention. Stomach/Bowel: Stomach is unremarkable. No gastric wall thickening. No evidence of outlet obstruction. Duodenum is normally positioned as is the ligament of  [place]. No small bowel wall thickening. No small bowel dilatation. The terminal ileum is normal. The appendix is normal. No gross colonic mass. No colonic wall thickening. Vascular/Lymphatic: No abdominal aortic aneurysm. No abdominal aortic atherosclerotic calcification. There is no gastrohepatic or hepatoduodenal ligament lymphadenopathy. No retroperitoneal or mesenteric lymphadenopathy. No pelvic sidewall lymphadenopathy. Reproductive: Uterus surgically absent. 4.3 cm benign appearing cyst noted in the left ovary, stable since 07/22/2015. Tiny follicles in the right ovary are also unchanged. Other: No intraperitoneal free fluid. Musculoskeletal: No worrisome lytic or sclerotic osseous  abnormality. IMPRESSION: 1. No acute findings in the abdomen or pelvis. No findings to explain the patient's history of pelvic and left lower quadrant pain. 2. 4.3 cm benign appearing cyst in the left ovary is stable since 07/22/2015 compatible with benign etiology. Electronically Signed   By: Misty Stanley M.D.   On: 07/18/2019 08:03   US PELVIC COMPLETE W TRANSVAGINAL AND TORSION R/O  Result Date: 07/18/2019 CLINICAL DATA:  Pelvic pain.  Prior hysterectomy. EXAM: TRANSABDOMINAL AND TRANSVAGINAL ULTRASOUND OF PELVIS DOPPLER ULTRASOUND OF OVARIES TECHNIQUE: Both transabdominal and transvaginal ultrasound examinations of the pelvis were performed. Transabdominal technique was performed for global imaging of the pelvis including uterus, ovaries, adnexal regions, and pelvic cul-de-sac. It was necessary to proceed with endovaginal exam following the transabdominal exam to visualize the ovary. Color and duplex Doppler ultrasound was utilized to evaluate blood flow to the ovaries. COMPARISON:  06/01/2019.  CT scan 07/23/2015. CT scan 01/18/2015. FINDINGS: Uterus Measurements: Surgically absent Right ovary Measurements: 2.9 x 2.4 x 2.0 cm = volume: 7.3 mL. 2.1 cm dominant follicle. Left ovary Measurements: 4.1 x 3.0 x 4.0 cm = volume: 26.1 mL. 3.8 cm simple cyst identified in the left ovary. Pulsed Doppler evaluation of both ovaries demonstrates normal low-resistance arterial and venous waveforms. Other findings No abnormal free fluid. IMPRESSION: 1. No evidence for ovarian torsion. 2. 3.8 cm simple cyst noted left ovary. This cyst shows no appreciable change since 01/18/2015, consistent with benign etiology. Electronically Signed   By: Misty Stanley M.D.   On: 07/18/2019 08:16    ____________________________________________   PROCEDURES   Procedure(s) performed (including Critical Care):  Procedures   ____________________________________________   INITIAL IMPRESSION / MDM / [place] / ED  COURSE  As part of my medical decision making, I reviewed the following data within the electronic MEDICAL RECORD NUMBER  54 year old female presented with above-stated history and physical exam with differential diagnosis including ovarian cysts, ovarian torsion, reticulitis.  Ultrasound revealed a 3.8 cm left ovarian cyst without any evidence of torsion.  Patient given IV morphine with improvement of pain.  Will obtain CT scan to evaluate for possible concomitant diverticulitis.  Patient's care transferred to Dr. Kerman Passey      ____________________________________________  FINAL CLINICAL IMPRESSION(S) / ED DIAGNOSES  Final diagnoses:  Left ovarian cyst  Abdominal pain, unspecified abdominal location     MEDICATIONS GIVEN DURING THIS VISIT:  Medications  morphine 2 MG/ML injection 2 mg (2 mg Intravenous Given 07/18/19 0609)  ondansetron (ZOFRAN) injection 4 mg (4 mg Intravenous Given 07/18/19 0607)  iohexol (OMNIPAQUE) 300 MG/ML solution 125 mL (125 mLs Intravenous Contrast Given 07/18/19 0717)     ED Discharge Orders    None      *Please note:  Kynlee Ryle was evaluated in Emergency Department on 07/19/2019 for the symptoms described in the history of present illness. She was evaluated in the context of the global COVID-19 pandemic, which necessitated consideration that  the patient might be at risk for infection with the SARS-CoV-2 virus that causes COVID-19. Institutional protocols and algorithms that pertain to the evaluation of patients at risk for COVID-19 are in a state of rapid change based on information released by regulatory bodies including the CDC and federal and state organizations. These policies and algorithms were followed during the patient's care in the ED.  Some ED evaluations and interventions may be delayed as a result of limited staffing during the pandemic.*  Note:  This document was prepared using Dragon voice recognition software and may include  unintentional dictation errors.   Gregor Hams, MD 07/19/19 [id number]    Gregor Hams, MD 07/19/19 [id number]

## 2019-07-18 NOTE — ED Notes (Signed)
Patient transported to Ultrasound 

## 2019-07-18 NOTE — ED Provider Notes (Signed)
-----------------------------------------   8:23 AM on 07/18/2019 -----------------------------------------  Patient care assumed from Dr. Owens Shark.  Patient's ultrasound consistent with approximate 4 cm stable ovarian cyst, CT scan negative for any other apparent abnormality.  We will discharge the patient home with PCP follow-up.  Lab work is largely [place].   Harvest Dark, MD 07/18/19 [phone]

## 2019-07-18 NOTE — ED Notes (Signed)
Pt reports pain in "lower vagina" sharp 10/10 and non-radiating since yesterday, hx of cyst within the last month  Pt reports partial hysterectomy and cholecystectomy   Tender to palpation at [facility], last "normal" BM yesterday

## 2019-07-28 ENCOUNTER — Encounter: Payer: Self-pay | Admitting: Obstetrics & Gynecology

## 2019-07-28 ENCOUNTER — Ambulatory Visit (INDEPENDENT_AMBULATORY_CARE_PROVIDER_SITE_OTHER): Payer: BC Managed Care – PPO | Admitting: Obstetrics & Gynecology

## 2019-07-28 ENCOUNTER — Other Ambulatory Visit (HOSPITAL_COMMUNITY)
Admission: RE | Admit: 2019-07-28 | Discharge: 2019-07-28 | Disposition: A | Discharge status: Home / Self Care | Payer: BC Managed Care – PPO | Source: Ambulatory Visit | Attending: Obstetrics & Gynecology | Admitting: Obstetrics & Gynecology

## 2019-07-28 ENCOUNTER — Other Ambulatory Visit: Payer: Self-pay

## 2019-07-28 VITALS — BP 130/80 | Ht 66.0 in | Wt 253.0 lb

## 2019-07-28 DIAGNOSIS — Z1272 Encounter for screening for malignant neoplasm of vagina: Secondary | ICD-10-CM | POA: Diagnosis present

## 2019-07-28 DIAGNOSIS — Z90710 Acquired absence of both cervix and uterus: Secondary | ICD-10-CM

## 2019-07-28 DIAGNOSIS — N83202 Unspecified ovarian cyst, left side: Secondary | ICD-10-CM | POA: Diagnosis not present

## 2019-07-28 DIAGNOSIS — R102 Pelvic and perineal pain: Secondary | ICD-10-CM | POA: Insufficient documentation

## 2019-07-28 MED ORDER — MELOXICAM 7.5 MG PO TABS: 7.5000 mg | ORAL_TABLET | Freq: Every day | ORAL | 1 refills | Status: DC

## 2019-07-28 NOTE — Progress Notes (Signed)
HPI: Patient is a 54 y.o. G3P3 : No LMP recorded. Patient has had a hysterectomy., presents today for a problem visit.  She complains of recent findings of Left ovarion cyst by Ultrasound - Pelvic Vaginal, CT - Pelvis.  Pt has had symptoms of pain, she reports acute onset of pain 2 weeks ago mostly midline pelvic and radiation to her vagina, and also some left sided burning pain, pain is severe and sharp, associated also with urinary frequency; denies nausea, change in bladder or bowel habits, VB.  No modifiers, has not taken narcotics and rarely takes IBF.  Seen in ER for this, with CT and US done.  ALso has prior studies in 2020 and even 2016 with small ovarian cysts sene then too, especially left ovary.   Prior hysterectomy, bladder surgery, NSVD x3.  PMHx: She  has a past medical history of Dysuria, Frequency, HTN (hypertension), Obesity, Overactive bladder, Sensory urge incontinence, and Stress incontinence. Also,  has a past surgical history that includes Abdominal hysterectomy and Colonoscopy with propofol (N/A, 03/24/2015)., family history includes Breast cancer in her sister; Breast cancer (age of onset: 71) in her mother; Cancer in her father, mother, and sister.,  reports that she quit smoking about 11 years ago. She quit after 7.00 years of use. She has never used smokeless tobacco. She reports that she does not drink alcohol or use drugs.  She has a current medication list which includes the following prescription(s): lisinopril-hydrochlorothiazide, acetaminophen, brompheniramine-pseudoephedrine-dm, meloxicam, multivitamin-iron-minerals-folic acid, trazodone, and vitamin d (ergocalciferol). Also, has No Known Allergies.  Review of Systems  Constitutional: Negative for chills, fever and malaise/fatigue.  HENT: Negative for congestion, sinus pain and sore throat.   Eyes: Negative for blurred vision and pain.  Respiratory: Negative for cough and wheezing.   Cardiovascular: Negative for  chest pain and leg swelling.  Gastrointestinal: Positive for abdominal pain. Negative for constipation, diarrhea, heartburn, nausea and vomiting.  Genitourinary: Positive for frequency. Negative for dysuria, hematuria and urgency.  Musculoskeletal: Negative for back pain, joint pain, myalgias and neck pain.  Skin: Negative for itching and rash.  Neurological: Negative for dizziness, tremors and weakness.  Endo/Heme/Allergies: Does not bruise/bleed easily.  Psychiatric/Behavioral: Negative for depression. The patient is not nervous/anxious and does not have insomnia.     Objective: BP 130/80   Ht 5\' 6"  (1.676 m)   Wt 253 lb (114.8 kg)   BMI 40.84 kg/m  Physical Exam Constitutional:      General: She is not in acute distress.    Appearance: She is well-developed.  Genitourinary:     Pelvic exam was performed with patient supine.     Vagina and rectum normal.     No lesions in the vagina.     No vaginal bleeding.     No right or left adnexal mass present.     Right adnexa not tender.     Left adnexa not tender.     Genitourinary Comments: Absent Uterus Absent cervix Vaginal cuff well healed  HENT:     Head: Normocephalic and atraumatic. No laceration.     Right Ear: Hearing normal.     Left Ear: Hearing normal.     Mouth/Throat:     Pharynx: Uvula midline.  Eyes:     Pupils: Pupils are equal, round, and reactive to light.  Neck:     Thyroid: No thyromegaly.  Cardiovascular:     Rate and Rhythm: Normal rate and regular rhythm.     Heart sounds: No  murmur. No friction rub. No gallop.   Pulmonary:     Effort: Pulmonary effort is normal. No respiratory distress.     Breath sounds: Normal breath sounds. No wheezing.  Chest:     Breasts:        Right: No mass, skin change or tenderness.        Left: No mass, skin change or tenderness.  Abdominal:     General: Bowel sounds are normal. There is no distension.     Palpations: Abdomen is soft.     Tenderness: There is no  abdominal tenderness. There is no rebound.  Musculoskeletal:        General: Normal range of motion.     Cervical back: Normal range of motion and neck supple.  Neurological:     Mental Status: She is alert and oriented to person, place, and time.     Cranial Nerves: No cranial nerve deficit.  Skin:    General: Skin is warm and dry.  Psychiatric:        Judgment: Judgment normal.  Vitals reviewed.     ASSESSMENT/PLAN:    Problem List Items Addressed This Visit      Endocrine   Ovarian cyst, left   Relevant Orders   CA 125   US PELVIC COMPLETE WITH TRANSVAGINAL     Other   Pelvic pain - Primary   Relevant Medications   meloxicam (MOBIC) 7.5 MG tablet   S/P hysterectomy    Surgery for BSO vs pain management and monitoring for growth or resolution of cyst ([place], Meloxicam) discussed CA125 to assess malignant potential Plan monitoring for now but if pain becomes severe or persistent then will consider surgery, also if cyst grows and becomes more worrisome for torsion  A total of 45 minutes were spent face-to-face with the patient as well as preparation, review, communication, and documentation during this encounter. [place] and CT review as well.  Barnett Applebaum, MD, [facility], [facility] 07/28/2019  8:55 AM

## 2019-07-28 NOTE — Patient Instructions (Signed)
Ovarian Cyst     An ovarian cyst is a fluid-filled sac that forms on an ovary. The ovaries are small organs that produce eggs in women. Various types of cysts can form on the ovaries. Some may cause symptoms and require treatment. Most ovarian cysts go away on their own, are not cancerous (are benign), and do not cause problems. Common types of ovarian cysts include:  Functional (follicle) cysts. ? Occur during the menstrual cycle, and usually go away with the next menstrual cycle if you do not get pregnant. ? Usually cause no symptoms.  Endometriomas. ? Are cysts that form from the tissue that lines the uterus (endometrium). ? Are sometimes called "chocolate cysts" because they become filled with blood that turns brown. ? Can cause pain in the lower abdomen during intercourse and during your period.  Cystadenoma cysts. ? Develop from cells on the outside surface of the ovary. ? Can get very large and cause lower abdomen pain and pain with intercourse. ? Can cause severe pain if they twist or break open (rupture).  Dermoid cysts. ? Are sometimes found in both ovaries. ? May contain different kinds of body tissue, such as skin, teeth, hair, or cartilage. ? Usually do not cause symptoms unless they get very big.  Theca lutein cysts. ? Occur when too much of a certain hormone (human chorionic gonadotropin) is produced and overstimulates the ovaries to produce an egg. ? Are most common after having procedures used to assist with the conception of a baby (in vitro fertilization). What are the causes? Ovarian cysts may be caused by:  Ovarian hyperstimulation syndrome. This is a condition that can develop from taking fertility medicines. It causes multiple large ovarian cysts to form.  Polycystic ovarian syndrome (PCOS). This is a common hormonal disorder that can cause ovarian cysts, as well as problems with your period or fertility. What increases the risk? The following factors may  make you more likely to develop ovarian cysts:  Being overweight or obese.  Taking fertility medicines.  Taking certain forms of hormonal birth control.  Smoking. What are the signs or symptoms? Many ovarian cysts do not cause symptoms. If symptoms are present, they may include:  Pelvic pain or pressure.  Pain in the lower abdomen.  Pain during sex.  Abdominal swelling.  Abnormal menstrual periods.  Increasing pain with menstrual periods. How is this diagnosed? These cysts are commonly found during a routine pelvic exam. You may have tests to find out more about the cyst, such as:  Ultrasound.  X-ray of the pelvis.  CT scan.  MRI.  Blood tests. How is this treated? Many ovarian cysts go away on their own without treatment. Your health care provider may want to check your cyst regularly for 2-3 months to see if it changes. If you are in menopause, it is especially important to have your cyst monitored closely because menopausal women have a higher rate of ovarian cancer. When treatment is needed, it may include:  Medicines to help relieve pain.  A procedure to drain the cyst (aspiration).  Surgery to remove the whole cyst.  Hormone treatment or birth control pills. These methods are sometimes used to help dissolve a cyst. Follow these instructions at home:  Take over-the-counter and prescription medicines only as told by your health care provider.  Do not drive or use heavy machinery while taking prescription pain medicine.  Get regular pelvic exams and Pap tests as often as told by your health care provider.    Return to your normal activities as told by your health care provider. Ask your health care provider what activities are safe for you.  Do not use any products that contain nicotine or tobacco, such as cigarettes and e-cigarettes. If you need help quitting, ask your health care provider.  Keep all follow-up visits as told by your health care provider.  This is important. Contact a health care provider if:  Your periods are late, irregular, or painful, or they stop.  You have pelvic pain that does not go away.  You have pressure on your bladder or trouble emptying your bladder completely.  You have pain during sex.  You have any of the following in your abdomen: ? A feeling of fullness. ? Pressure. ? Discomfort. ? Pain that does not go away. ? Swelling.  You feel generally ill.  You become constipated.  You lose your appetite.  You develop severe acne.  You start to have more body hair and facial hair.  You are gaining weight or losing weight without changing your exercise and eating habits.  You think you may be pregnant. Get help right away if:  You have abdominal pain that is severe or gets worse.  You cannot eat or drink without vomiting.  You suddenly develop a fever.  Your menstrual period is much heavier than usual. This information is not intended to replace advice given to you by your health care provider. Make sure you discuss any questions you have with your health care provider. Document Revised: 09/22/2017 Document Reviewed: 11/26/2015 Elsevier Patient Education  2020 [employer].   Ovarian Cystectomy Ovarian cystectomy is a procedure that is done to remove a fluid-filled sac (cyst) on an ovary. The ovaries are small organs that produce eggs in women. Various types of cysts can form on the ovaries. Most are not cancerous. This procedure may be done for cysts that are large, cause symptoms, or do not go away on their own. It may also be done for a cyst that is cancerous or might be cancerous. This surgery can be done using a laparoscopic technique or an open abdominal technique. The laparoscopic technique is minimally invasive and results in smaller incisions and a faster recovery. The technique used will depend on your age, the type of cyst that you have, and whether the cyst is cancerous. The  laparoscopic technique is not used for a cancerous cyst. Tell a health care provider about:  Any allergies you have.  All medicines you are taking, including vitamins, herbs, eye drops, creams, and over-the-counter medicines.  Any problems you or family members have had with anesthetic medicines.  Any blood disorders you have.  Any surgeries you have had.  Any medical conditions you have.  Whether you are pregnant or may be pregnant. What are the risks? Generally, this is a safe procedure. However, problems may occur, including:  Excessive bleeding.  Infection.  Damage to nearby structures or organs.  Allergic reactions to medicines.  Blood clots.  Inability to get pregnant (infertility). What happens before the procedure? Staying hydrated Follow instructions from your health care provider about hydration, which may include:  Up to 2 hours before the procedure - you may continue to drink clear liquids, such as water, clear fruit juice, black coffee, and plain tea.  Eating and drinking restrictions Follow instructions from your health care provider about eating and drinking, which may include:  8 hours before the procedure - stop eating heavy meals or foods, such as meat, fried  foods, or fatty foods.  6 hours before the procedure - stop eating light meals or foods, such as toast or cereal.  6 hours before the procedure - stop drinking milk or drinks that contain milk.  2 hours before the procedure - stop drinking clear liquids. Medicines Ask your health care provider about:  Changing or stopping your regular medicines. This is especially important if you are taking diabetes medicines or blood thinners.  Taking medicines such as aspirin and ibuprofen. These medicines can thin your blood. Do not take these medicines unless your health care provider tells you to take them.  Taking over-the-counter medicines, vitamins, herbs, and supplements. General  instructions  Do not use any products that contain nicotine or tobacco for at least 4 weeks before the procedure. These products include cigarettes, e-cigarettes, and chewing tobacco. If you need help quitting, ask your health care provider.  Ask your health care provider: ? How your surgery site will be marked. ? What steps will be taken to help prevent infection. These may include:  Removing hair at the surgery site.  Washing skin with a germ-killing soap.  Taking antibiotic medicine.  You may be asked to shower with a germ-killing soap.  Plan to have someone take you home from the hospital or clinic.  Plan to have someone help with household activities for 1-2 weeks after the procedure.  Let your health care provider know if you develop a cold or any infection before your surgery. What happens during the procedure?  An IV will be inserted into one of your veins.  You will be given one or more of the following: ? A medicine to help you relax (sedative). ? A medicine to make you fall asleep (general anesthetic).  Small monitors will be attached to your body. They will be used to check your heart, blood pressure, and oxygen level.  A breathing tube will be placed into your lungs during the procedure.  Your surgeon will do the surgery using either the laparoscopic technique or the open abdominal technique. Laparoscopic technique   Several small incisions will be made in your abdomen.  Your abdomen will be filled with carbon dioxide gas to make it expand. This will give the surgeon more room to operate. It will also make your organs easier to see.  A thin scope with a camera (laparoscope) will be put through one of the small incisions. The laparoscope will send a picture to a monitor in the operating room to help the surgeon see inside your body.  Hollow tubes will be put through the other small incisions in your abdomen. The tools needed for the procedure will be put through  these tubes.  The ovary with the cyst will be identified, and the cyst will be removed.  The tools will then be removed, and the incisions will be closed with stitches or skin glue. Bandages (dressings) may be applied. Open abdominal technique  A single, large incision will be made along your bikini line or in the middle of your lower abdomen.  The ovary with the cyst will be identified, and the cyst will be removed.  The incision will then be closed with stitches or staples.  Bandages (dressings) may be applied. The procedure may vary among health care providers and hospitals. What happens after the procedure?  Your blood pressure, heart rate, breathing rate, and blood oxygen level will be monitored until you leave the hospital or clinic.  Your IV will be removed after you are  able to eat and drink well.  You may be given medicine for pain or to help you sleep.  You may be given an antibiotic medicine.  Do not drive for 24 hours if you were given a sedative during the procedure.  The cyst that was removed will be sent to the lab for testing. If the cyst has cancer cells, both ovaries may need to be removed during a different surgery.  It is up to you to get the results of your procedure. Ask your health care provider, or the department that is doing the procedure, when your results will be ready. Summary  Ovarian cystectomy is a procedure that is done to remove a cyst on an ovary.  This procedure may be done for cysts that are large, cause symptoms, or do not go away on their own. It may also be done for a cyst that is cancerous or might be cancerous.  Follow instructions from your health care provider about eating and drinking before the procedure.  After the cyst is removed, it will be sent to the lab for testing. This information is not intended to replace advice given to you by your health care provider. Make sure you discuss any questions you have with your health care  provider. Document Revised: 01/22/2019 Document Reviewed: 01/22/2019 Elsevier Patient Education  Sherry Hernandez.

## 2019-07-28 NOTE — Addendum Note (Signed)
Addended by: Gae Dry on: 07/28/2019 02:55 PM   Modules accepted: Orders

## 2019-07-29 LAB — CA 125: Cancer Antigen (CA) 125: 10.5 U/mL (ref 0.0–38.1)

## 2019-07-29 NOTE — Progress Notes (Signed)
Discussed results of CA125 (normal ) w patient Some pain last night Will cont to monitor pain and if frequent or worsening then would plan surgery Otherwise she has appt for 2/17 w [place] follow up  Barnett Applebaum, MD, [place], [facility] 07/29/2019  9:17 AM

## 2019-08-02 LAB — CYTOLOGY - PAP
Comment: NEGATIVE
Diagnosis: NEGATIVE
High risk HPV: NEGATIVE

## 2019-08-25 ENCOUNTER — Ambulatory Visit (INDEPENDENT_AMBULATORY_CARE_PROVIDER_SITE_OTHER): Payer: BC Managed Care – PPO

## 2019-08-25 ENCOUNTER — Telehealth: Payer: Self-pay | Admitting: Obstetrics & Gynecology

## 2019-08-25 ENCOUNTER — Ambulatory Visit (INDEPENDENT_AMBULATORY_CARE_PROVIDER_SITE_OTHER): Payer: BC Managed Care – PPO | Admitting: Obstetrics & Gynecology

## 2019-08-25 ENCOUNTER — Encounter: Payer: Self-pay | Admitting: Obstetrics & Gynecology

## 2019-08-25 ENCOUNTER — Other Ambulatory Visit: Payer: Self-pay

## 2019-08-25 VITALS — BP 140/88 | Ht 66.0 in | Wt 264.0 lb

## 2019-08-25 DIAGNOSIS — R102 Pelvic and perineal pain: Secondary | ICD-10-CM

## 2019-08-25 DIAGNOSIS — Z90710 Acquired absence of both cervix and uterus: Secondary | ICD-10-CM | POA: Diagnosis not present

## 2019-08-25 DIAGNOSIS — N83291 Other ovarian cyst, right side: Secondary | ICD-10-CM | POA: Diagnosis not present

## 2019-08-25 DIAGNOSIS — N83202 Unspecified ovarian cyst, left side: Secondary | ICD-10-CM

## 2019-08-25 NOTE — Patient Instructions (Signed)
Ovarian Cystectomy, Care After  This sheet gives you information about how to care for yourself after your procedure. Your health care provider may also give you more specific instructions. If you have problems or questions, contact your health care provider.  What can I expect after the procedure?  After the procedure, it is common to have:  · Pain in the abdomen, especially at the incision areas. You will be given pain medicines to control the pain.  · Tiredness. This is a normal part of the recovery process. Your energy level will return to normal over the next several weeks.  Follow these instructions at home:  Medicines  · Take over-the-counter and prescription medicines only as told by your health care provider.  · If you were prescribed an antibiotic medicine, use it as told by your health care provider. Do not stop using the antibiotic even if you start to feel better.  · Do not take aspirin because it can cause bleeding.  · Ask your health care provider if the medicine prescribed to you:  ? Requires you to avoid driving or using heavy machinery.  ? Can cause constipation. You may need to take these actions to prevent or treat constipation:  § Drink enough fluid to keep your urine pale yellow.  § Take over-the-counter or prescription medicines.  § Eat foods that are high in fiber, such as beans, whole grains, and fresh fruits and vegetables.  § Limit foods that are high in fat and processed sugars, such as fried or sweet foods.  Incision care    · Follow instructions from your health care provider about how to take care of your incisions. Make sure you:  ? Wash your hands with soap and water before and after you change your bandage (dressing). If soap and water are not available, use hand sanitizer.  ? Change your dressing as told by your health care provider.  ? Leave stitches (sutures), skin glue, or adhesive strips in place. These skin closures may need to stay in place for 2 weeks or longer. If adhesive  strip edges start to loosen and curl up, you may trim the loose edges. Do not remove adhesive strips completely unless your health care provider tells you to do that.  · Check your incision areas every day for signs of infection. Check for:  ? Redness, swelling, or pain.  ? Fluid or blood.  ? Warmth.  ? Pus or a bad smell.  · Do not take baths, swim, or use a hot tub until your health care provider approves. Ask your health care provider if you may take showers. You may only be allowed to take sponge baths.  Activity  · Rest as told by your health care provider.  · Avoid sitting for a long time without moving. Get up to take short walks every 1-2 hours. This is important to improve blood flow and breathing. Ask for help if you feel weak or unsteady.  · Do not lift anything that is heavier than 10 lb (4.5 kg), or the limit that you are told, until your health care provider says that it is safe.  · Return to your normal activities and diet as told by your health care provider. Ask your health care provider what activities are safe for you.  General instructions  · Do not douche, use tampons, or have sexual intercourse until your health care provider says it is okay to do so.  · Do not use any products that   contain nicotine or tobacco, such as cigarettes, e-cigarettes, and chewing tobacco. These can delay incision healing after surgery. If you need help quitting, ask your health care provider.  · Keep all follow-up visits as told by your health care provider. This is important.  Contact a health care provider if:  · You have a fever.  · You feel nauseous or you vomit.  · You have pain when you urinate or have blood in your urine.  · You have a rash on your body.  · You have pain or redness where the IV was inserted.  · You have pain that is not relieved with medicine.  · You have any of these signs of infection:  ? Redness, swelling, or pain around your incisions.  ? Fluid or blood coming from your incisions.  ? Warmth  coming from an incision.  ? Pus or a bad smell coming from your incisions.  Get help right away if:  · You have chest pain or shortness of breath.  · You feel dizzy or light-headed.  · You have heavy bleeding.  · You have increasing abdominal pain that is not relieved with medicines.  · You have pain, swelling, or redness in your leg.  · Your incision is opening (the edges are not staying together).  Summary  · After the procedure, it is common to have some pain in your abdomen. You will be given pain medicines to control the pain.  · Follow instructions from your health care provider about how to take care of your incisions.  · Do not douche, use tampons, or have sexual intercourse until your health care provider says it is okay to do so.  · Keep all follow-up visits as told by your health care provider. This is important.  This information is not intended to replace advice given to you by your health care provider. Make sure you discuss any questions you have with your health care provider.  Document Revised: 01/21/2019 Document Reviewed: 01/21/2019  Elsevier Patient Education © 2020 Elsevier Inc.

## 2019-08-25 NOTE — Progress Notes (Signed)
  HPI: Abdominal Pain Patient presents for evaluation of abdominal pain. The pain is described as aching, sharp, shooting and stabbing, and is 8/10 in intensity. Pain is located in the deep pelvis area with radiation to the LLQ, also back. Onset was ongoing occurring 6 weeks ago. Symptoms have been unchanged since. Aggravating factors: activity. Alleviating factors: acetaminophen. Associated symptoms: bowel movement frequency. The patient denies chills, diarrhea, fever and nausea. Risk factors for pelvic/abdominal pain include Left Ovarian Cyst diagnosed by CT and [place] last month, 4 cm in size.  CA 125 normal at that time.Marland Kitchen  Ultrasound demonstrates cyst seen on left ovary 4 cm.  PMHx: She  has a past medical history of Dysuria, Frequency, HTN (hypertension), Obesity, Overactive bladder, Sensory urge incontinence, and Stress incontinence. Also,  has a past surgical history that includes Abdominal hysterectomy and Colonoscopy with propofol (N/A, 03/24/2015)., family history includes Breast cancer in her sister; Breast cancer (age of onset: 50) in her mother; Cancer in her father, mother, and sister.,  reports that she quit smoking about 11 years ago. She quit after 7.00 years of use. She has never used smokeless tobacco. She reports that she does not drink alcohol or use drugs.  She has a current medication list which includes the following prescription(s): acetaminophen, brompheniramine-pseudoephedrine-dm, lisinopril-hydrochlorothiazide, meloxicam, multivitamin-iron-minerals-folic acid, trazodone, and vitamin d (ergocalciferol). Also, has No Known Allergies.  Review of Systems  All other systems reviewed and are negative.   Objective: BP 140/88   Ht 5\' 6"  (1.676 m)   Wt 264 lb (119.7 kg)   BMI 42.61 kg/m   Physical examination Constitutional NAD, Conversant  Skin No rashes, lesions or ulceration.   Extremities: Moves all appropriately.  Normal ROM for age. No lymphadenopathy.  Neuro: Grossly  intact  Psych: Oriented to PPT.  Normal mood. Normal affect.    Assessment:    ICD-10-CM   1. Pelvic pain  R10.2   2. Ovarian cyst, left  N83.202   Options for cyst management discussed, and due to her amount of frequent pain she desires surgery for removal.  Pros and cons of cystectomy vs oophorectomy discussed, and she prefers (as she is menopausal) for Bilateral Oophopectomy.  A total of 25 minutes were spent face-to-face with the patient as well as preparation, review, communication, and documentation during this encounter.   Barnett Applebaum, MD, [facility], [facility] 08/25/2019  9:13 AM

## 2019-08-25 NOTE — Telephone Encounter (Signed)
-----   Message from Gae Dry, MD sent at 08/25/2019  9:13 AM EST ----- Regarding: Surgery Surgery Booking Request Patient Full Name:  Sherry Hernandez  MRN: [id number]  DOB: [birth year]  Surgeon: Hoyt Koch, MD  Requested Surgery Date and Time: MAR 23 (must be this day) Primary Diagnosis AND Code:     1. Pelvic pain  R10.2  2. Ovarian cyst, left  N83.202  Surgical Procedure: Laparoscopy Bilateral Oophorectomy L&D Notification: No Admission Status: same day surgery Length of Surgery: 30 min Special Case Needs: No H&P: No Phone Interview???:  Yes Interpreter: No Language:  Medical Clearance:  No Special Scheduling Instructions: SUPINE Any known health/anesthesia issues, diabetes, sleep apnea, latex allergy, defibrillator/pacemaker?: No Acuity: P2   (P1 highest, P2 delay may cause harm, P3 low, elective gyn, P4 lowest) Priority 2 (pain, cystic mass)

## 2019-08-25 NOTE — Telephone Encounter (Signed)
Conf surgery date of 3/23 w/ pt  No H&P needed per Reva Bores pt of Covid testing on Fri, 3/19 at [facility], [place]. Drive up and wear a mask. Adv that pt will need to quarn after test is administered until DOS.  Adv pt that she could rec pre-op call from [facility], [place] [facility]  Conf pt has [employer]BS and no secondary

## 2019-08-25 NOTE — Progress Notes (Signed)
PRE-OPERATIVE HISTORY AND PHYSICAL EXAM  HPI:  Sherry Hernandez is a 54 y.o. No obstetric history on file. No LMP recorded. Patient has had a hysterectomy.; she is being admitted for surgery related to pelvic pain and left ovarian cyst, persistent on scans/surveillance and continued moderate to sdevere pain in the deep pelvis radiating to her LLQ and back.  CA 125 normal.  Prior hysterectomy.  Recent scan left 4 cm ovarian cyst.  PMHx: Past Medical History:  Diagnosis Date  . Dysuria   . Frequency   . HTN (hypertension)   . Obesity   . Overactive bladder   . Sensory urge incontinence   . Stress incontinence    Past Surgical History:  Procedure Laterality Date  . ABDOMINAL HYSTERECTOMY    . COLONOSCOPY WITH PROPOFOL N/A 03/24/2015   Procedure: COLONOSCOPY WITH PROPOFOL;  Surgeon: Sherry Class, MD;  Location: [facility] ENDOSCOPY;  Service: Endoscopy;  Laterality: N/A;   Family History  Problem Relation Age of Onset  . Cancer Father   . Cancer Mother   . Breast cancer Mother 87  . Cancer Sister   . Breast cancer Sister    Social History   Tobacco Use  . Smoking status: Former Smoker    Years: 7.00    Quit date: 09/20/2007    Years since quitting: 11.9  . Smokeless tobacco: Never Used  Substance Use Topics  . Alcohol use: No    Alcohol/week: 0.0 standard drinks  . Drug use: No    Current Outpatient Medications:  .  acetaminophen (TYLENOL) 500 MG tablet, Take 1,000 mg by mouth every 6 (six) hours as needed for mild pain or headache., Disp: , Rfl:  .  brompheniramine-pseudoephedrine-DM 30-2-10 MG/5ML syrup, Take 10 mLs by mouth every 4 (four) hours as needed for cough., Disp: , Rfl:  .  lisinopril-hydrochlorothiazide (PRINZIDE,ZESTORETIC) 10-12.5 MG per tablet, Take 1 tablet by mouth daily., Disp: , Rfl:  .  meloxicam (MOBIC) 7.5 MG tablet, Take 1 tablet (7.5 mg total) by mouth daily. (Patient not taking: Reported on 08/25/2019), Disp: 30 tablet, Rfl: 1 .   multivitamin-iron-minerals-folic acid (CENTRUM) chewable tablet, Chew 1 tablet by mouth daily., Disp: , Rfl:  .  traZODone (DESYREL) 50 MG tablet, Take 50 mg by mouth at bedtime as needed for sleep. New RX, Disp: , Rfl:  .  Vitamin D, Ergocalciferol, (DRISDOL) 1.25 MG (50000 UT) CAPS capsule, Take 50,000 Units by mouth once a week. X 30 days start 03-11-19 still at pharmacy, Disp: , Rfl:  Allergies: Patient has no known allergies.  Review of Systems  Constitutional: Negative for chills, fever and malaise/fatigue.  HENT: Negative for congestion, sinus pain and sore throat.   Eyes: Negative for blurred vision and pain.  Respiratory: Negative for cough and wheezing.   Cardiovascular: Negative for chest pain and leg swelling.  Gastrointestinal: Positive for abdominal pain. Negative for constipation, diarrhea, heartburn, nausea and vomiting.  Genitourinary: Negative for dysuria, frequency, hematuria and urgency.  Musculoskeletal: Negative for back pain, joint pain, myalgias and neck pain.  Skin: Negative for itching and rash.  Neurological: Negative for dizziness, tremors and weakness.  Endo/Heme/Allergies: Does not bruise/bleed easily.  Psychiatric/Behavioral: Negative for depression. The patient is not nervous/anxious and does not have insomnia.     Objective: BP 140/88   Ht 5\' 6"  (1.676 m)   Wt 264 lb (119.7 kg)   BMI 42.61 kg/m   Filed Weights   08/25/19 0853  Weight: 264 lb (119.7  kg)   Physical Exam Constitutional:      General: She is not in acute distress.    Appearance: She is well-developed.  HENT:     Head: Normocephalic and atraumatic. No laceration.     Right Ear: Hearing normal.     Left Ear: Hearing normal.     Mouth/Throat:     Pharynx: Uvula midline.  Eyes:     Pupils: Pupils are equal, round, and reactive to light.  Neck:     Thyroid: No thyromegaly.  Cardiovascular:     Rate and Rhythm: Normal rate and regular rhythm.     Heart sounds: No murmur. No  friction rub. No gallop.   Pulmonary:     Effort: Pulmonary effort is normal. No respiratory distress.     Breath sounds: Normal breath sounds. No wheezing.  Chest:     Breasts:        Right: No mass, skin change or tenderness.        Left: No mass, skin change or tenderness.  Abdominal:     General: Bowel sounds are normal. There is no distension.     Palpations: Abdomen is soft.     Tenderness: There is generalized abdominal tenderness and tenderness in the left lower quadrant. There is no rebound.  Musculoskeletal:        General: Normal range of motion.     Cervical back: Normal range of motion and neck supple.  Neurological:     Mental Status: She is alert and oriented to person, place, and time.     Cranial Nerves: No cranial nerve deficit.  Skin:    General: Skin is warm and dry.  Psychiatric:        Judgment: Judgment normal.  Vitals reviewed.     Assessment: 1. Pelvic pain   2. Ovarian cyst, left   Options discussed, plan Laparoscopy BSO based on cyst, pain, and menopausal status  I have had a careful discussion with this patient about all the options available and the risk/benefits of each. I have fully informed this patient that surgery may subject her to a variety of discomforts and risks: She understands that most patients have surgery with little difficulty, but problems can happen ranging from minor to fatal. These include nausea, vomiting, pain, bleeding, infection, poor healing, hernia, or formation of adhesions. Unexpected reactions may occur from any drug or anesthetic given. Unintended injury may occur to other pelvic or abdominal structures such as Fallopian tubes, ovaries, bladder, ureter (tube from kidney to bladder), or bowel. Nerves going from the pelvis to the legs may be injured. Any such injury may require immediate or later additional surgery to correct the problem. Excessive blood loss requiring transfusion is very unlikely but possible. Dangerous blood  clots may form in the legs or lungs. Physical and sexual activity will be restricted in varying degrees for an indeterminate period of time but most often 2-6 weeks.  Finally, she understands that it is impossible to list every possible undesirable effect and that the condition for which surgery is done is not always cured or significantly improved, and in rare cases may be even worse.Ample time was given to answer all questions.  Barnett Applebaum, MD, [facility], [facility] 08/25/2019  9:29 AM

## 2019-09-23 ENCOUNTER — Other Ambulatory Visit: Payer: Self-pay

## 2019-09-23 ENCOUNTER — Encounter
Admission: RE | Admit: 2019-09-23 | Discharge: 2019-09-23 | Disposition: A | Discharge status: Home / Self Care | Payer: BC Managed Care – PPO | Source: Ambulatory Visit | Attending: Obstetrics & Gynecology | Admitting: Obstetrics & Gynecology

## 2019-09-23 HISTORY — DX: Prediabetes: R73.03

## 2019-09-23 HISTORY — DX: Family history of other specified conditions: Z84.89

## 2019-09-23 NOTE — Patient Instructions (Addendum)
Your procedure is scheduled on: 09/28/19 Report to [place]. To find out your arrival time please call [phone] between 1PM - 3PM on 09/27/19.  Remember: Instructions that are not followed completely may result in serious medical risk, up to and including death, or upon the discretion of your surgeon and anesthesiologist your surgery may need to be rescheduled.     _X__ 1. Do not eat food after midnight the night before your procedure.                 No gum chewing or hard candies. You may drink clear liquids up to 2 hours                 before you are scheduled to arrive for your surgery- DO not drink clear                 liquids within 2 hours of the start of your surgery.                 Clear Liquids include:  water, apple juice without pulp, clear carbohydrate                 drink such as Clearfast or Gatorade, Black Coffee or Tea (Do not add                 anything to coffee or tea). Diabetics water only  __X__2.  On the morning of surgery brush your teeth with toothpaste and water, you                 may rinse your mouth with mouthwash if you wish.  Do not swallow any              toothpaste of mouthwash.     _X__ 3.  No Alcohol for 24 hours before or after surgery.   _X__ 4.  Do Not Smoke or use e-cigarettes For 24 Hours Prior to Your Surgery.                 Do not use any chewable tobacco products for at least 6 hours prior to                 surgery.  ____  5.  Bring all medications with you on the day of surgery if instructed.   __X__  6.  Notify your doctor if there is any change in your medical condition      (cold, fever, infections).     Do not wear jewelry, make-up, hairpins, clips or nail polish. Do not wear lotions, powders, or perfumes.  Do not shave 48 hours prior to surgery. Men may shave face and neck. Do not bring valuables to the hospital.    [facility] is not responsible for any belongings or  valuables.  Contacts, dentures/partials or body piercings may not be worn into surgery. Bring a case for your contacts, glasses or hearing aids, a denture cup will be supplied. Leave your suitcase in the car. After surgery it may be brought to your room. For patients admitted to the hospital, discharge time is determined by your treatment team.   Patients discharged the day of surgery will not be allowed to drive home.   Please read over the following fact sheets that you were given:   MRSA Information  __X__ Take these medicines the morning of surgery with A SIP OF WATER:  1. NONE  2.   3.   4.  5.  6.  ____ Fleet Enema (as directed)   __X__ Use CHG Soap/SAGE wipes as directed  ____ Use inhalers on the day of surgery  ____ Stop metformin/Janumet/Farxiga 2 days prior to surgery    ____ Take 1/2 of usual insulin dose the night before surgery. No insulin the morning          of surgery.   ____ Stop Blood Thinners Coumadin/Plavix/Xarelto/Pleta/Pradaxa/Eliquis/Effient/Aspirin  on   Or contact your Surgeon, Cardiologist or Medical Doctor regarding  ability to stop your blood thinners  __X__ Stop Anti-inflammatories 7 days before surgery such as Advil, Ibuprofen, Motrin,  BC or Goodies Powder, Naprosyn, Naproxen, Aleve, Aspirin    __X__ Stop all herbal supplements, fish oil or vitamin E until after surgery.    ____ Bring C-Pap to the hospital.    REVIEW INCENTIVE SPIROMETER INSTRUCTIONS

## 2019-09-24 ENCOUNTER — Other Ambulatory Visit
Admission: RE | Admit: 2019-09-24 | Discharge: 2019-09-24 | Disposition: A | Discharge status: Home / Self Care | Payer: BC Managed Care – PPO | Source: Ambulatory Visit | Attending: Obstetrics & Gynecology | Admitting: Obstetrics & Gynecology

## 2019-09-24 DIAGNOSIS — Z01812 Encounter for preprocedural laboratory examination: Secondary | ICD-10-CM | POA: Insufficient documentation

## 2019-09-24 DIAGNOSIS — Z20822 Contact with and (suspected) exposure to covid-19: Secondary | ICD-10-CM | POA: Insufficient documentation

## 2019-09-24 LAB — CBC
HCT: 40.9 % (ref 36.0–46.0)
Hemoglobin: 13.5 g/dL (ref 12.0–15.0)
MCH: 29.4 pg (ref 26.0–34.0)
MCHC: 33 g/dL (ref 30.0–36.0)
MCV: 89.1 fL (ref 80.0–100.0)
Platelets: 213 10*3/uL (ref 150–400)
RBC: 4.59 MIL/uL (ref 3.87–5.11)
RDW: 14.5 % (ref 11.5–15.5)
WBC: 4.5 10*3/uL (ref 4.0–10.5)
nRBC: 0 % (ref 0.0–0.2)

## 2019-09-24 LAB — TYPE AND SCREEN
ABO/RH(D): A POS
Antibody Screen: NEGATIVE

## 2019-09-24 LAB — SARS CORONAVIRUS 2 (TAT 6-24 HRS): SARS Coronavirus 2: NEGATIVE

## 2019-09-28 ENCOUNTER — Ambulatory Visit: Payer: BC Managed Care – PPO

## 2019-09-28 ENCOUNTER — Other Ambulatory Visit: Payer: Self-pay

## 2019-09-28 ENCOUNTER — Encounter: Payer: Self-pay | Admitting: Obstetrics & Gynecology

## 2019-09-28 ENCOUNTER — Ambulatory Visit
Admission: RE | Admit: 2019-09-28 | Discharge: 2019-09-28 | Disposition: A | Discharge status: Home / Self Care | Payer: BC Managed Care – PPO | Attending: Obstetrics & Gynecology | Admitting: Obstetrics & Gynecology

## 2019-09-28 ENCOUNTER — Encounter
Admission: RE | Disposition: A | Discharge status: Home / Self Care | Payer: Self-pay | Source: Home / Self Care | Attending: Obstetrics & Gynecology

## 2019-09-28 DIAGNOSIS — N83202 Unspecified ovarian cyst, left side: Secondary | ICD-10-CM | POA: Diagnosis present

## 2019-09-28 DIAGNOSIS — R102 Pelvic and perineal pain unspecified side: Secondary | ICD-10-CM | POA: Diagnosis present

## 2019-09-28 DIAGNOSIS — D271 Benign neoplasm of left ovary: Secondary | ICD-10-CM | POA: Insufficient documentation

## 2019-09-28 DIAGNOSIS — N3946 Mixed incontinence: Secondary | ICD-10-CM | POA: Insufficient documentation

## 2019-09-28 DIAGNOSIS — N83291 Other ovarian cyst, right side: Secondary | ICD-10-CM

## 2019-09-28 DIAGNOSIS — I10 Essential (primary) hypertension: Secondary | ICD-10-CM | POA: Diagnosis not present

## 2019-09-28 DIAGNOSIS — K660 Peritoneal adhesions (postprocedural) (postinfection): Secondary | ICD-10-CM | POA: Insufficient documentation

## 2019-09-28 DIAGNOSIS — Z6841 Body Mass Index (BMI) 40.0 and over, adult: Secondary | ICD-10-CM | POA: Diagnosis not present

## 2019-09-28 DIAGNOSIS — R7303 Prediabetes: Secondary | ICD-10-CM | POA: Diagnosis not present

## 2019-09-28 DIAGNOSIS — Z87891 Personal history of nicotine dependence: Secondary | ICD-10-CM | POA: Diagnosis not present

## 2019-09-28 DIAGNOSIS — E669 Obesity, unspecified: Secondary | ICD-10-CM | POA: Diagnosis not present

## 2019-09-28 DIAGNOSIS — N83292 Other ovarian cyst, left side: Secondary | ICD-10-CM

## 2019-09-28 LAB — ABO/RH: ABO/RH(D): A POS

## 2019-09-28 SURGERY — OOPHORECTOMY, LAPAROSCOPIC
Anesthesia: General | Laterality: Bilateral

## 2019-09-28 MED ORDER — FAMOTIDINE 20 MG PO TABS
ORAL_TABLET | ORAL | Status: AC
  Administered 2019-09-28: 07:00:00 20 mg via ORAL
  Filled 2019-09-28: qty 1

## 2019-09-28 MED ORDER — OXYCODONE HCL 5 MG/5ML PO SOLN: 5.0000 mg | Freq: Once | ORAL | Status: DC | PRN

## 2019-09-28 MED ORDER — DEXAMETHASONE SODIUM PHOSPHATE 10 MG/ML IJ SOLN
INTRAMUSCULAR | Status: DC | PRN
  Administered 2019-09-28: 10 mg via INTRAVENOUS

## 2019-09-28 MED ORDER — ACETAMINOPHEN 650 MG RE SUPP
650.0000 mg | RECTAL | Status: DC | PRN
  Filled 2019-09-28: qty 1

## 2019-09-28 MED ORDER — ACETAMINOPHEN 10 MG/ML IV SOLN
INTRAVENOUS | Status: DC | PRN
  Administered 2019-09-28: 1000 mg via INTRAVENOUS

## 2019-09-28 MED ORDER — MIDAZOLAM HCL 2 MG/2ML IJ SOLN
INTRAMUSCULAR | Status: DC | PRN
  Administered 2019-09-28: 2 mg via INTRAVENOUS

## 2019-09-28 MED ORDER — HEMOSTATIC AGENTS (NO CHARGE) OPTIME
TOPICAL | Status: DC | PRN
  Administered 2019-09-28: 1 via TOPICAL

## 2019-09-28 MED ORDER — PROPOFOL 10 MG/ML IV BOLUS
INTRAVENOUS | Status: AC
  Filled 2019-09-28: qty 20

## 2019-09-28 MED ORDER — FENTANYL CITRATE (PF) 100 MCG/2ML IJ SOLN
INTRAMUSCULAR | Status: AC
  Administered 2019-09-28: 10:00:00 25 ug via INTRAVENOUS
  Filled 2019-09-28: qty 2

## 2019-09-28 MED ORDER — FENTANYL CITRATE (PF) 100 MCG/2ML IJ SOLN
INTRAMUSCULAR | Status: AC
  Filled 2019-09-28: qty 2

## 2019-09-28 MED ORDER — ACETAMINOPHEN 325 MG PO TABS: 650.0000 mg | ORAL_TABLET | ORAL | Status: DC | PRN

## 2019-09-28 MED ORDER — EPHEDRINE SULFATE 50 MG/ML IJ SOLN
INTRAMUSCULAR | Status: DC | PRN
  Administered 2019-09-28 (×2): 10 mg via INTRAVENOUS
  Administered 2019-09-28: 5 mg via INTRAVENOUS

## 2019-09-28 MED ORDER — FAMOTIDINE 20 MG PO TABS: 20.0000 mg | ORAL_TABLET | Freq: Once | ORAL | Status: AC

## 2019-09-28 MED ORDER — PHENYLEPHRINE HCL (PRESSORS) 10 MG/ML IV SOLN
INTRAVENOUS | Status: DC | PRN
  Administered 2019-09-28 (×2): 100 ug via INTRAVENOUS

## 2019-09-28 MED ORDER — DEXAMETHASONE SODIUM PHOSPHATE 10 MG/ML IJ SOLN
INTRAMUSCULAR | Status: AC
  Filled 2019-09-28: qty 3

## 2019-09-28 MED ORDER — SODIUM CHLORIDE 0.9 % IV SOLN
INTRAVENOUS | Status: DC | PRN
  Administered 2019-09-28: 30 ug/min via INTRAVENOUS

## 2019-09-28 MED ORDER — SEVOFLURANE IN SOLN
RESPIRATORY_TRACT | Status: AC
  Filled 2019-09-28: qty 250

## 2019-09-28 MED ORDER — OXYCODONE-ACETAMINOPHEN 5-325 MG PO TABS: 1.0000 | ORAL_TABLET | ORAL | 0 refills | Status: DC | PRN

## 2019-09-28 MED ORDER — GLYCOPYRROLATE 0.2 MG/ML IJ SOLN
INTRAMUSCULAR | Status: DC | PRN
  Administered 2019-09-28: .2 mg via INTRAVENOUS

## 2019-09-28 MED ORDER — OXYCODONE HCL 5 MG PO TABS: 5.0000 mg | ORAL_TABLET | Freq: Once | ORAL | Status: DC | PRN

## 2019-09-28 MED ORDER — BUPIVACAINE HCL (PF) 0.5 % IJ SOLN
INTRAMUSCULAR | Status: DC | PRN
  Administered 2019-09-28: 10 mL

## 2019-09-28 MED ORDER — ONDANSETRON HCL 4 MG/2ML IJ SOLN
INTRAMUSCULAR | Status: AC
  Filled 2019-09-28: qty 8

## 2019-09-28 MED ORDER — ROCURONIUM BROMIDE 100 MG/10ML IV SOLN
INTRAVENOUS | Status: DC | PRN
  Administered 2019-09-28: 50 mg via INTRAVENOUS
  Administered 2019-09-28: 10 mg via INTRAVENOUS

## 2019-09-28 MED ORDER — FENTANYL CITRATE (PF) 100 MCG/2ML IJ SOLN
INTRAMUSCULAR | Status: DC | PRN
  Administered 2019-09-28 (×2): 50 ug via INTRAVENOUS

## 2019-09-28 MED ORDER — LACTATED RINGERS IV SOLN: Freq: Once | INTRAVENOUS | Status: AC

## 2019-09-28 MED ORDER — MORPHINE SULFATE (PF) 4 MG/ML IV SOLN: 1.0000 mg | INTRAVENOUS | Status: DC | PRN

## 2019-09-28 MED ORDER — FENTANYL CITRATE (PF) 100 MCG/2ML IJ SOLN
INTRAMUSCULAR | Status: AC
  Administered 2019-09-28: 09:00:00 25 ug via INTRAVENOUS
  Filled 2019-09-28: qty 2

## 2019-09-28 MED ORDER — LACTATED RINGERS IV SOLN: INTRAVENOUS | Status: DC

## 2019-09-28 MED ORDER — GLYCOPYRROLATE 0.2 MG/ML IJ SOLN
INTRAMUSCULAR | Status: AC
  Filled 2019-09-28: qty 3

## 2019-09-28 MED ORDER — SUGAMMADEX SODIUM 500 MG/5ML IV SOLN
INTRAVENOUS | Status: AC
  Filled 2019-09-28: qty 5

## 2019-09-28 MED ORDER — SUGAMMADEX SODIUM 500 MG/5ML IV SOLN
INTRAVENOUS | Status: DC | PRN
  Administered 2019-09-28: 480 mg via INTRAVENOUS

## 2019-09-28 MED ORDER — FENTANYL CITRATE (PF) 100 MCG/2ML IJ SOLN
25.0000 ug | INTRAMUSCULAR | Status: AC | PRN
  Administered 2019-09-28 (×4): 25 ug via INTRAVENOUS

## 2019-09-28 MED ORDER — LIDOCAINE HCL (PF) 2 % IJ SOLN
INTRAMUSCULAR | Status: AC
  Filled 2019-09-28: qty 15

## 2019-09-28 MED ORDER — SUCCINYLCHOLINE CHLORIDE 20 MG/ML IJ SOLN
INTRAMUSCULAR | Status: DC | PRN
  Administered 2019-09-28: 120 mg via INTRAVENOUS

## 2019-09-28 MED ORDER — ONDANSETRON HCL 4 MG/2ML IJ SOLN
INTRAMUSCULAR | Status: DC | PRN
  Administered 2019-09-28 (×2): 4 mg via INTRAVENOUS

## 2019-09-28 MED ORDER — OXYCODONE-ACETAMINOPHEN 5-325 MG PO TABS: 1.0000 | ORAL_TABLET | ORAL | Status: DC | PRN

## 2019-09-28 MED ORDER — LIDOCAINE HCL (CARDIAC) PF 100 MG/5ML IV SOSY
PREFILLED_SYRINGE | INTRAVENOUS | Status: DC | PRN
  Administered 2019-09-28: 100 mg via INTRAVENOUS

## 2019-09-28 MED ORDER — ACETAMINOPHEN NICU IV SYRINGE 10 MG/ML
INTRAVENOUS | Status: AC
  Filled 2019-09-28: qty 1

## 2019-09-28 MED ORDER — KETOROLAC TROMETHAMINE 30 MG/ML IJ SOLN
INTRAMUSCULAR | Status: DC | PRN
  Administered 2019-09-28: 30 mg via INTRAVENOUS

## 2019-09-28 MED ORDER — LACTATED RINGERS IV SOLN: INTRAVENOUS | Status: DC | PRN

## 2019-09-28 MED ORDER — EPHEDRINE 5 MG/ML INJ
INTRAVENOUS | Status: AC
  Filled 2019-09-28: qty 10

## 2019-09-28 MED ORDER — PHENYLEPHRINE HCL (PRESSORS) 10 MG/ML IV SOLN
INTRAVENOUS | Status: AC
  Filled 2019-09-28: qty 1

## 2019-09-28 MED ORDER — MIDAZOLAM HCL 2 MG/2ML IJ SOLN
INTRAMUSCULAR | Status: AC
  Filled 2019-09-28: qty 2

## 2019-09-28 MED ORDER — PROPOFOL 10 MG/ML IV BOLUS
INTRAVENOUS | Status: DC | PRN
  Administered 2019-09-28: 180 mg via INTRAVENOUS

## 2019-09-28 MED ORDER — KETOROLAC TROMETHAMINE 30 MG/ML IJ SOLN
INTRAMUSCULAR | Status: AC
  Filled 2019-09-28: qty 2

## 2019-09-28 MED ORDER — BUPIVACAINE HCL (PF) 0.5 % IJ SOLN
INTRAMUSCULAR | Status: AC
  Filled 2019-09-28: qty 30

## 2019-09-28 MED ORDER — ONDANSETRON HCL 4 MG/2ML IJ SOLN: 4.0000 mg | Freq: Once | INTRAMUSCULAR | Status: DC | PRN

## 2019-09-28 MED ORDER — ESMOLOL HCL 100 MG/10ML IV SOLN
INTRAVENOUS | Status: AC
  Filled 2019-09-28: qty 10

## 2019-09-28 MED ORDER — ACETAMINOPHEN 10 MG/ML IV SOLN: 1000.0000 mg | Freq: Once | INTRAVENOUS | Status: DC | PRN

## 2019-09-28 SURGICAL SUPPLY — 45 items
ANCHOR TIS RET SYS 235ML (MISCELLANEOUS) ×2 IMPLANT
APPLICATOR ARISTA FLEXITIP XL (MISCELLANEOUS) ×2 IMPLANT
BLADE SURG SZ11 CARB STEEL (BLADE) ×3 IMPLANT
CANISTER SUCT 1200ML W/VALVE (MISCELLANEOUS) ×3 IMPLANT
CATH ROBINSON RED A/P 16FR (CATHETERS) ×3 IMPLANT
CHLORAPREP W/TINT 26 (MISCELLANEOUS) ×3 IMPLANT
COVER WAND RF STERILE (DRAPES) IMPLANT
DERMABOND ADVANCED (GAUZE/BANDAGES/DRESSINGS) ×2
DERMABOND ADVANCED .7 DNX12 (GAUZE/BANDAGES/DRESSINGS) ×1 IMPLANT
DRSG TELFA 4X3 1S NADH ST (GAUZE/BANDAGES/DRESSINGS) IMPLANT
GLOVE BIO SURGEON STRL SZ8 (GLOVE) ×6 IMPLANT
GLOVE INDICATOR 8.0 STRL GRN (GLOVE) ×3 IMPLANT
GOWN STRL REUS W/ TWL LRG LVL3 (GOWN DISPOSABLE) ×1 IMPLANT
GOWN STRL REUS W/ TWL XL LVL3 (GOWN DISPOSABLE) ×1 IMPLANT
GOWN STRL REUS W/TWL LRG LVL3 (GOWN DISPOSABLE) ×2
GOWN STRL REUS W/TWL XL LVL3 (GOWN DISPOSABLE) ×2
GRASPER SUT TROCAR 14GX15 (MISCELLANEOUS) ×3 IMPLANT
HEMOSTAT ARISTA ABSORB 3G PWDR (HEMOSTASIS) ×2 IMPLANT
IRRIGATION STRYKERFLOW (MISCELLANEOUS) IMPLANT
IRRIGATOR STRYKERFLOW (MISCELLANEOUS) ×3
IV LACTATED RINGERS 1000ML (IV SOLUTION) ×2 IMPLANT
KIT PINK PAD W/HEAD ARE REST (MISCELLANEOUS) ×3
KIT PINK PAD W/HEAD ARM REST (MISCELLANEOUS) ×1 IMPLANT
LABEL OR SOLS (LABEL) ×3 IMPLANT
NEEDLE VERESS 14GA 120MM (NEEDLE) ×3 IMPLANT
NS IRRIG 500ML POUR BTL (IV SOLUTION) ×3 IMPLANT
PACK GYN LAPAROSCOPIC (MISCELLANEOUS) ×3 IMPLANT
PAD PREP 24X41 OB/GYN DISP (PERSONAL CARE ITEMS) ×3 IMPLANT
POUCH SPECIMEN RETRIEVAL 10MM (ENDOMECHANICALS) ×2 IMPLANT
SCISSORS METZENBAUM CVD 33 (INSTRUMENTS) ×3 IMPLANT
SET TUBE SMOKE EVAC HIGH FLOW (TUBING) ×3 IMPLANT
SHEARS HARMONIC ACE PLUS 36CM (ENDOMECHANICALS) ×2 IMPLANT
SLEEVE ENDOPATH XCEL 5M (ENDOMECHANICALS) IMPLANT
SPONGE GAUZE 2X2 8PLY STER LF (GAUZE/BANDAGES/DRESSINGS)
SPONGE GAUZE 2X2 8PLY STRL LF (GAUZE/BANDAGES/DRESSINGS) IMPLANT
STRAP SAFETY 5IN WIDE (MISCELLANEOUS) ×3 IMPLANT
SUT VIC AB 0 CT1 36 (SUTURE) ×3 IMPLANT
SUT VIC AB 2-0 UR6 27 (SUTURE) IMPLANT
SUT VIC AB 4-0 PS2 18 (SUTURE) IMPLANT
SYR 10ML LL (SYRINGE) ×3 IMPLANT
SYSTEM WECK SHIELD CLOSURE (TROCAR) IMPLANT
TRAY FOLEY SLVR 16FR LF STAT (SET/KITS/TRAYS/PACK) ×2 IMPLANT
TROCAR 5M 150ML BLDLS (TROCAR) ×2 IMPLANT
TROCAR ENDO BLADELESS 11MM (ENDOMECHANICALS) IMPLANT
TROCAR XCEL NON-BLD 5MMX100MML (ENDOMECHANICALS) ×3 IMPLANT

## 2019-09-28 NOTE — Anesthesia Postprocedure Evaluation (Signed)
Anesthesia Post Note  Patient: Sherry Hernandez  Procedure(s) Performed: LAPAROSCOPIC BILATERAL OOPHORECTOMY (Bilateral )  Patient location during evaluation: PACU Anesthesia Type: General Level of consciousness: awake and alert Pain management: pain level controlled Vital Signs Assessment: post-procedure vital signs reviewed and stable Respiratory status: spontaneous breathing, nonlabored ventilation, respiratory function stable and patient connected to nasal cannula oxygen Cardiovascular status: blood pressure returned to baseline and stable Postop Assessment: no apparent nausea or vomiting Anesthetic complications: no     Last Vitals:  Vitals:   09/28/19 1016 09/28/19 1039  BP: (!) 103/49 96/64  Pulse: 82 87  Resp: 18 18  Temp: 36.5 C   SpO2: 94% 95%    Last Pain:  Vitals:   09/28/19 1039  TempSrc:   PainSc: 0-No pain                 Arita Miss

## 2019-09-28 NOTE — Discharge Instructions (Addendum)
Bilateral Salpingo-Oophorectomy, Care After This sheet gives you information about how to care for yourself after your procedure. Your health care provider may also give you more specific instructions. If you have problems or questions, contact your health care provider. What can I expect after the procedure? After the procedure, it is common to have:  Abdominal pain.  Some occasional vaginal bleeding (spotting).  Tiredness.  Symptoms of menopause, such as hot flashes, night sweats, or mood swings. Follow these instructions at home: Incision care   Keep your incision area and your bandage (dressing) clean and dry.  Follow instructions from your health care provider about how to take care of your incision. Make sure you: ? Wash your hands with soap and water before you change your dressing. If soap and water are not available, use hand sanitizer. ? Change your dressing as told by your health care provider. ? Leave stitches (sutures), staples, skin glue, or adhesive strips in place. These skin closures may need to stay in place for 2 weeks or longer. If adhesive strip edges start to loosen and curl up, you may trim the loose edges. Do not remove adhesive strips completely unless your health care provider tells you to do that.  Check your incision area every day for signs of infection. Check for: ? Redness, swelling, or pain. ? Fluid or blood. ? Warmth. ? Pus or a bad smell. Activity   Do not drive or use heavy machinery while taking prescription pain medicine.  Do not drive for 24 hours if you received a medicine to help you relax (sedative) during your procedure.  Take frequent, short walks throughout the day. Rest when you get tired. Ask your health care provider what activities are safe for you.  Avoid activity that requires great effort. Also, avoid heavy lifting. Do not lift anything that is heavier than 10 lbs. (4.5 kg), or the limit that your health care provider tells you,  until he or she says that it is safe to do so.  Do not douche, use tampons, or have sex until your health care provider approves. General instructions   To prevent or treat constipation while you are taking prescription pain medicine, your health care provider may recommend that you: ? Drink enough fluid to keep your urine clear or pale yellow. ? Take over-the-counter or prescription medicines. ? Eat foods that are high in fiber, such as fresh fruits and vegetables, whole grains, and beans. ? Limit foods that are high in fat and processed sugars, such as fried and sweet foods.  Take over-the-counter and prescription medicines only as told by your health care provider.  Do not take baths, swim, or use a hot tub until your health care provider approves. Ask your health care provider if you can take showers. You may only be allowed to take sponge baths for bathing.  Wear compression stockings as told by your health care provider. These stockings help to prevent blood clots and reduce swelling in your legs.  Keep all follow-up visits as told by your health care provider. This is important. Contact a health care provider if:  You have pain when you urinate.  You have pus or a bad smelling discharge coming from your vagina.  You have redness, swelling, or pain around your incision.  You have fluid or blood coming from your incision.  Your incision feels warm to the touch.  You have pus or a bad smell coming from your incision.  You have a fever.    Your incision starts to break open.  You have pain in the abdomen, and it gets worse or does not get better when you take medicine.  You develop a rash.  You develop nausea and vomiting.  You feel lightheaded. Get help right away if:  You develop pain in your chest or leg.  You become short of breath.  You faint.  You have increased bleeding from your vagina. Summary  After the procedure, it is common to have pain, bleeding  in the vagina, and symptoms of menopause.  Follow instructions from your health care provider about how to take care of your incision.  Follow instructions from your health care provider about activities and restrictions.  Check your incision every day for signs of infection and report any symptoms to your health care provider. This information is not intended to replace advice given to you by your health care provider. Make sure you discuss any questions you have with your health care provider. Document Revised: 08/28/2018 Document Reviewed: 07/29/2016 Elsevier Patient Education  2020 [employer]   AMBULATORY SURGERY  DISCHARGE INSTRUCTIONS   1) The drugs that you were given will stay in your system until tomorrow so for the next 24 hours you should not:  A) Drive an automobile B) Make any legal decisions C) Drink any alcoholic beverage   2) You may resume regular meals tomorrow.  Today it is better to start with liquids and gradually work up to solid foods.  You may eat anything you prefer, but it is better to start with liquids, then soup and crackers, and gradually work up to solid foods.   3) Please notify your doctor immediately if you have any unusual bleeding, trouble breathing, redness and pain at the surgery site, drainage, fever, or pain not relieved by medication.    4) Additional Instructions:        Please contact your physician with any problems or Same Day Surgery at [phone], Monday through Friday 6 am to 4 pm, or [place] [facility] [place] [facility] number at [phone]. 

## 2019-09-28 NOTE — Transfer of Care (Signed)
Immediate Anesthesia Transfer of Care Note  Patient: Sherry Hernandez  Procedure(s) Performed: LAPAROSCOPIC BILATERAL OOPHORECTOMY (Bilateral )  Patient Location: PACU  Anesthesia Type:General  Level of Consciousness: awake, alert , oriented and patient cooperative  Airway & Oxygen Therapy: Patient Spontanous Breathing and Patient connected to face mask oxygen  Post-op Assessment: Report given to RN and Post -op Vital signs reviewed and stable  Post vital signs: Reviewed and stable  Last Vitals:  Vitals Value Taken Time  BP 114/50 09/28/19 0926  Temp 37.2 C 09/28/19 0916  Pulse 91 09/28/19 0929  Resp 21 09/28/19 0929  SpO2 98 % 09/28/19 0929  Vitals shown include unvalidated device data.  Last Pain:  Vitals:   09/28/19 0925  TempSrc:   PainSc: 8       Patients Stated Pain Goal: 2 ([id number])  Complications: No apparent anesthesia complications

## 2019-09-28 NOTE — H&P (Signed)
History and Physical Interval Note:  09/28/2019 7:07 AM  Sherry Hernandez  has presented today for surgery, with the diagnosis of Pelvic pain R10.2 Ovarian cyst, left N83.202  The various methods of treatment have been discussed with the patient and family. After consideration of risks, benefits and other options for treatment, the patient has consented to  Procedure(s): LAPAROSCOPIC BILATERAL OOPHORECTOMY (Bilateral) as a surgical intervention .  The patient's history has been reviewed, patient examined, no change in status, stable for surgery.  Pt has the following beta blocker history-  Not taking Beta Blocker.  I have reviewed the patient's chart and labs.  Questions were answered to the patient's satisfaction.    Barnett Applebaum, MD, [facility], [facility] 09/28/2019  7:07 AM

## 2019-09-28 NOTE — Anesthesia Preprocedure Evaluation (Signed)
Anesthesia Evaluation  Patient identified by MRN, date of birth, ID band Patient awake    Reviewed: Allergy & Precautions, NPO status , Patient's Chart, lab work & pertinent test results  History of Anesthesia Complications Negative for: history of anesthetic complications  Airway Mallampati: II  TM Distance: >3 FB Neck ROM: Full    Dental no notable dental hx. (+) Teeth Intact, Dental Advisory Given   Pulmonary neg pulmonary ROS, neg sleep apnea, neg COPD, Patient abstained from smoking.Not current smoker, former smoker,    Pulmonary exam normal breath sounds clear to auscultation       Cardiovascular Exercise Tolerance: Good METShypertension, Pt. on medications (-) CAD and (-) Past MI negative cardio ROS  (-) dysrhythmias  Rhythm:Regular Rate:Normal - Systolic murmurs    Neuro/Psych negative neurological ROS  negative psych ROS   GI/Hepatic neg GERD  ,(+)     (-) substance abuse  ,   Endo/Other  neg diabetesMorbid obesity  Renal/GU negative Renal ROS     Musculoskeletal   Abdominal   Peds  Hematology   Anesthesia Other Findings Past Medical History: No date: Dysuria No date: Family history of adverse reaction to anesthesia     Comment:  MOTHER PONV No date: Frequency No date: HTN (hypertension) No date: Obesity No date: Overactive bladder No date: Pre-diabetes No date: Sensory urge incontinence No date: Stress incontinence  Reproductive/Obstetrics                             Anesthesia Physical Anesthesia Plan  ASA: III  Anesthesia Plan: General   Post-op Pain Management:    Induction: Intravenous  PONV Risk Score and Plan: 4 or greater and Ondansetron, Dexamethasone and Midazolam  Airway Management Planned: Oral ETT  Additional Equipment: None  Intra-op Plan:   Post-operative Plan: Extubation in OR  Informed Consent: I have reviewed the patients History and  Physical, chart, labs and discussed the procedure including the risks, benefits and alternatives for the proposed anesthesia with the patient or authorized representative who has indicated his/her understanding and acceptance.     Dental advisory given  Plan Discussed with: CRNA and Surgeon  Anesthesia Plan Comments: (Discussed risks of anesthesia with patient, including PONV, sore throat, lip/dental damage. Rare risks discussed as well, such as cardiorespiratory and neurological sequelae. Patient understands.)        Anesthesia Quick Evaluation

## 2019-09-28 NOTE — H&P (Signed)
@LOGO @    PRE-OPERATIVE HISTORY AND PHYSICAL EXAM  HPI:  Sherry Hernandez is a 54 y.o. No obstetric history on file. No LMP recorded. Patient has had a hysterectomy.; she is being admitted for surgery related to pelvic pain and left ovarian cyst that has been bothersome to her for a few months now.  Prior hysterectomy. CA125 normal.  Plan removal of both ovaires due to age and cyst..  PMHx: Past Medical History:  Diagnosis Date  . Dysuria   . Family history of adverse reaction to anesthesia    MOTHER PONV  . Frequency   . HTN (hypertension)   . Obesity   . Overactive bladder   . Pre-diabetes   . Sensory urge incontinence   . Stress incontinence    Past Surgical History:  Procedure Laterality Date  . ABDOMINAL HYSTERECTOMY    . COLONOSCOPY WITH PROPOFOL N/A 03/24/2015   Procedure: COLONOSCOPY WITH PROPOFOL;  Surgeon: Josefine Class, MD;  Location: [facility] ENDOSCOPY;  Service: Endoscopy;  Laterality: N/A;   Family History  Problem Relation Age of Onset  . Cancer Father   . Cancer Mother   . Breast cancer Mother 74  . Cancer Sister   . Breast cancer Sister    Social History   Tobacco Use  . Smoking status: Former Smoker    Years: 7.00    Quit date: 09/20/2007    Years since quitting: 12.0  . Smokeless tobacco: Never Used  Substance Use Topics  . Alcohol use: No    Alcohol/week: 0.0 standard drinks  . Drug use: No    Current Facility-Administered Medications:  .  lactated ringers infusion, , Intravenous, Continuous, Tera Mater, MD, Last Rate: 125 mL/hr at 09/28/19 0646, New Bag at 09/28/19 0646 Allergies: Patient has no known allergies.  Review of Systems  Constitutional: Negative for chills, fever and malaise/fatigue.  HENT: Negative for congestion, sinus pain and sore throat.   Eyes: Negative for blurred vision and pain.  Respiratory: Negative for cough and wheezing.   Cardiovascular: Negative for chest pain and leg swelling.  Gastrointestinal: Positive  for abdominal pain. Negative for constipation, diarrhea, heartburn, nausea and vomiting.  Genitourinary: Negative for dysuria, frequency, hematuria and urgency.  Musculoskeletal: Negative for back pain, joint pain, myalgias and neck pain.  Skin: Negative for itching and rash.  Neurological: Negative for dizziness, tremors and weakness.  Endo/Heme/Allergies: Does not bruise/bleed easily.  Psychiatric/Behavioral: Negative for depression. The patient is not nervous/anxious and does not have insomnia.     Objective: Pulse 76   Temp 97.8 F (36.6 C) (Tympanic)   Resp 20   Ht 5\' 6"  (1.676 m)   Wt 115.9 kg   SpO2 97%   BMI 41.25 kg/m   Filed Weights   09/28/19 0634  Weight: 115.9 kg   Physical Exam Constitutional:      General: She is not in acute distress.    Appearance: She is well-developed.  HENT:     Head: Normocephalic and atraumatic. No laceration.     Right Ear: Hearing normal.     Left Ear: Hearing normal.     Mouth/Throat:     Pharynx: Uvula midline.  Eyes:     Pupils: Pupils are equal, round, and reactive to light.  Neck:     Thyroid: No thyromegaly.  Cardiovascular:     Rate and Rhythm: Normal rate and regular rhythm.     Heart sounds: No murmur. No friction rub. No gallop.   Pulmonary:  Effort: Pulmonary effort is normal. No respiratory distress.     Breath sounds: Normal breath sounds. No wheezing.  Chest:     Breasts:        Right: No mass, skin change or tenderness.        Left: No mass, skin change or tenderness.  Abdominal:     General: Bowel sounds are normal. There is no distension.     Palpations: Abdomen is soft.     Tenderness: There is no abdominal tenderness. There is no rebound.  Musculoskeletal:        General: Normal range of motion.     Cervical back: Normal range of motion and neck supple.  Neurological:     Mental Status: She is alert and oriented to person, place, and time.     Cranial Nerves: No cranial nerve deficit.  Skin:     General: Skin is warm and dry.  Psychiatric:        Judgment: Judgment normal.  Vitals reviewed.     Assessment: Left Ovarian Cyst, Pelvic pain Plan Laparoscopy BSO for treatment as well as prevention for future cysts, cancer.  I have had a careful discussion with this patient about all the options available and the risk/benefits of each. I have fully informed this patient that surgery may subject her to a variety of discomforts and risks: She understands that most patients have surgery with little difficulty, but problems can happen ranging from minor to fatal. These include nausea, vomiting, pain, bleeding, infection, poor healing, hernia, or formation of adhesions. Unexpected reactions may occur from any drug or anesthetic given. Unintended injury may occur to other pelvic or abdominal structures such as Fallopian tubes, ovaries, bladder, ureter (tube from kidney to bladder), or bowel. Nerves going from the pelvis to the legs may be injured. Any such injury may require immediate or later additional surgery to correct the problem. Excessive blood loss requiring transfusion is very unlikely but possible. Dangerous blood clots may form in the legs or lungs. Physical and sexual activity will be restricted in varying degrees for an indeterminate period of time but most often 2-6 weeks.  Finally, she understands that it is impossible to list every possible undesirable effect and that the condition for which surgery is done is not always cured or significantly improved, and in rare cases may be even worse.Ample time was given to answer all questions.  Barnett Applebaum, MD, [facility], [facility] 09/28/2019  7:14 AM

## 2019-09-28 NOTE — Op Note (Signed)
Operative Note:  PRE-OP DIAGNOSIS: Pelvic pain R10.2 Ovarian cyst, left N83.202   POST-OP DIAGNOSIS: Pelvic pain R10.2 Ovarian cyst, left N83.202   PROCEDURE: Procedure(s): LAPAROSCOPIC BILATERAL OOPHORECTOMY  SURGEON: Barnett Applebaum, MD, FACOG  ASST: Dr Marisue Brooklyn, No other capable assistant available, in surgery requiring high level assistant.  ANESTHESIA: General endotracheal anesthesia  ESTIMATED BLOOD LOSS: less than 50   SPECIMENS: Tubes and Ovaries.  COMPLICATIONS: None  DISPOSITION: stable to PACU  FINDINGS: Intraabdominal adhesions were noted in a minimal fashion in the pelvis s/p hysterectomy.  Left ovarian clear fluid filled cyst noted.  PROCEDURE IN DETAIL: The patient was taken to the OR where anesthesia was administed. The patient was positioned supine. Foley placed.  Attention was turned to the patient's abdomen where a 11 mm skin incision was made in the umbilical fold, after injection of local anesthesia. The Veress step needle was carefully introduced into the peritoneal cavity with placement confirmed using the hanging drop technique. Pneumoperitoneum was obtained. The 11 mm port was then placed under direct visualization with the operative laparoscope The above noted findings. Trendelenburg.  5 mm trocars were then placed in the LLQ and RLQ lateral to the inferior epigastric blood vessels under direct visualization with the laparoscope.A necessary dissection of adhesions surrounding the left ovary is performed.  Right and left fallopian tubes/ovaries are identified and the ovarian blood vessels/infundibulopelvic ligament pedicles are coagulated and ligated using the Harmonic scapel.The ovaries are ten freed from any pelvic side wall tissues for complete amputation.  They are removed through a specimen bag. No injuries or bleeding was noted. Arista is used over the pedicles to ensure hemostasis.   Umbilical fascia closed with a 0 Vicryl suture using a fascia  closure device.  All instruments and ports were then removed from the abdomen after gas was expelled and patient was leveled. The skin was closed with skin adhesive. The foley catheter was removed. The patient tolerated the procedure well. All counts were correct x 2. The patient was transferred to the recovery room awake, alert and breathing independently.  Barnett Applebaum, MD, [facility], [facility] 09/28/2019  9:15 AM

## 2019-09-28 NOTE — Progress Notes (Signed)
[place] [id number] [place] [address] Phone: [phone]  September 28, 2019  Patient: Sherry Hernandez  Date of Birth: [birth year]  Date of Visit: 09/28/2019    To Whom It May Concern:  [name] was seen and treated in our Hospital for surgery on 09/28/2019. [name]  may return to work on 09/30/19.  Sincerely,  Barnett Applebaum, MD [facility]

## 2019-09-28 NOTE — Anesthesia Procedure Notes (Signed)
Procedure Name: Intubation Performed by: Kelton Pillar, CRNA Pre-anesthesia Checklist: Patient identified, Emergency Drugs available, Suction available and Patient being monitored Patient Re-evaluated:Patient Re-evaluated prior to induction Oxygen Delivery Method: Circle system utilized Preoxygenation: Pre-oxygenation with 100% oxygen Induction Type: IV induction Ventilation: Mask ventilation without difficulty Laryngoscope Size: McGraph and 3 Grade View: Grade I Tube type: Oral Tube size: 7.0 mm Number of attempts: 1 Airway Equipment and Method: Stylet Placement Confirmation: ETT inserted through vocal cords under direct vision,  breath sounds checked- equal and bilateral,  CO2 detector and positive ETCO2 Secured at: 21 cm Tube secured with: Tape Dental Injury: Teeth and Oropharynx as per pre-operative assessment

## 2019-09-29 LAB — SURGICAL PATHOLOGY

## 2019-10-03 ENCOUNTER — Other Ambulatory Visit: Payer: Self-pay

## 2019-10-03 DIAGNOSIS — M7918 Myalgia, other site: Secondary | ICD-10-CM | POA: Insufficient documentation

## 2019-10-03 DIAGNOSIS — T881XXA Other complications following immunization, not elsewhere classified, initial encounter: Secondary | ICD-10-CM | POA: Insufficient documentation

## 2019-10-03 DIAGNOSIS — Z87891 Personal history of nicotine dependence: Secondary | ICD-10-CM | POA: Diagnosis not present

## 2019-10-03 DIAGNOSIS — R519 Headache, unspecified: Secondary | ICD-10-CM | POA: Diagnosis not present

## 2019-10-03 DIAGNOSIS — Y69 Unspecified misadventure during surgical and medical care: Secondary | ICD-10-CM | POA: Diagnosis not present

## 2019-10-03 DIAGNOSIS — Z79899 Other long term (current) drug therapy: Secondary | ICD-10-CM | POA: Insufficient documentation

## 2019-10-03 DIAGNOSIS — R1030 Lower abdominal pain, unspecified: Secondary | ICD-10-CM | POA: Insufficient documentation

## 2019-10-03 DIAGNOSIS — I10 Essential (primary) hypertension: Secondary | ICD-10-CM | POA: Insufficient documentation

## 2019-10-03 LAB — COMPREHENSIVE METABOLIC PANEL
ALT: 32 U/L (ref 0–44)
AST: 24 U/L (ref 15–41)
Albumin: 3.7 g/dL (ref 3.5–5.0)
Alkaline Phosphatase: 84 U/L (ref 38–126)
Anion gap: 10 (ref 5–15)
BUN: 16 mg/dL (ref 6–20)
CO2: 24 mmol/L (ref 22–32)
Calcium: 9.1 mg/dL (ref 8.9–10.3)
Chloride: 103 mmol/L (ref 98–111)
Creatinine, Ser: 0.79 mg/dL (ref 0.44–1.00)
GFR calc Af Amer: >60 mL/min (ref >60)
GFR calc non Af Amer: >60 mL/min (ref >60)
Glucose, Bld: 107 mg/dL — ABNORMAL HIGH (ref 70–99)
Potassium: 3.8 mmol/L (ref 3.5–5.1)
Sodium: 137 mmol/L (ref 135–145)
Total Bilirubin: 0.6 mg/dL (ref 0.3–1.2)
Total Protein: 6.8 g/dL (ref 6.5–8.1)

## 2019-10-03 LAB — URINALYSIS, COMPLETE (UACMP) WITH MICROSCOPIC
Bilirubin Urine: NEGATIVE
Glucose, UA: NEGATIVE mg/dL
Hgb urine dipstick: NEGATIVE
Ketones, ur: NEGATIVE mg/dL
Nitrite: NEGATIVE
Protein, ur: NEGATIVE mg/dL
Specific Gravity, Urine: 1.015 (ref 1.005–1.030)
pH: 7 (ref 5.0–8.0)

## 2019-10-03 LAB — CBC
HCT: 41 % (ref 36.0–46.0)
Hemoglobin: 13.6 g/dL (ref 12.0–15.0)
MCH: 29.4 pg (ref 26.0–34.0)
MCHC: 33.2 g/dL (ref 30.0–36.0)
MCV: 88.6 fL (ref 80.0–100.0)
Platelets: 206 10*3/uL (ref 150–400)
RBC: 4.63 MIL/uL (ref 3.87–5.11)
RDW: 14.5 % (ref 11.5–15.5)
WBC: 4.6 10*3/uL (ref 4.0–10.5)
nRBC: 0 % (ref 0.0–0.2)

## 2019-10-03 LAB — LIPASE, BLOOD: Lipase: 44 U/L (ref 11–51)

## 2019-10-03 NOTE — ED Triage Notes (Signed)
Pt states she had her ovaries removed Tuesday, and recieved 1st COVID vaccination Saturday. and reports starting last night pt has experienced chills and headache and lower abdominal pain.

## 2019-10-04 ENCOUNTER — Emergency Department
Admission: EM | Admit: 2019-10-04 | Discharge: 2019-10-04 | Disposition: A | Discharge status: Home / Self Care | Payer: BC Managed Care – PPO | Attending: Emergency Medicine | Admitting: Emergency Medicine

## 2019-10-04 DIAGNOSIS — T881XXA Other complications following immunization, not elsewhere classified, initial encounter: Secondary | ICD-10-CM

## 2019-10-04 NOTE — Discharge Instructions (Signed)
As we discussed, your workup was reassuring tonight without any evidence of infection.  I believe you are having symptoms after your COVID-19 vaccination, which is normal and means the vaccination is working!  Please continue to eat and drink normally and make sure you stay hydrated.  Take over-the-counter ibuprofen and Tylenol according to label instructions for your symptoms and follow-up with Dr. Kenton Kingfisher as scheduled.  Return to the emergency department if you develop new or worsening symptoms that concern you.

## 2019-10-04 NOTE — ED Provider Notes (Signed)
[facility] Emergency Department Provider Note  ____________________________________________   First MD Initiated Contact with Patient 10/04/19 0023     (approximate)  I have reviewed the triage vital signs and the nursing notes.   HISTORY  Chief Complaint Post-op Problem    HPI Sherry Hernandez is a 54 y.o. female whose medical history is most notable for bilateral oophorectomy about 6 days ago and first Covid vaccination ([employer]) less than 2 days ago.  She presents tonight for evaluation of chills and some generalized body aches and mild headache.  She has had persistent lower abdominal pain since her surgery by Dr. Kenton Kingfisher which is no better and no worse.  She is able to eat and drink with no nausea or vomiting.  She has had normal bowel movements.  She denies chest pain and shortness of breath.  Overall her symptoms are mild to moderate but she was concerned about them given the recent surgery.  She has a follow-up appointment in 2 more days with Dr. Kenton Kingfisher.  Nothing in particular makes the symptoms better or worse.         Past Medical History:  Diagnosis Date  . Dysuria   . Family history of adverse reaction to anesthesia    MOTHER PONV  . Frequency   . HTN (hypertension)   . Obesity   . Overactive bladder   . Pre-diabetes   . Sensory urge incontinence   . Stress incontinence     Patient Active Problem List   Diagnosis Date Noted  . Pelvic pain 07/28/2019  . Ovarian cyst, left 07/28/2019  . S/P hysterectomy 07/28/2019  . Pneumonia due to COVID-19 virus 03/21/2019  . Uncontrollable vomiting   . Intractable nausea and vomiting 07/22/2015    Past Surgical History:  Procedure Laterality Date  . ABDOMINAL HYSTERECTOMY    . COLONOSCOPY WITH PROPOFOL N/A 03/24/2015   Procedure: COLONOSCOPY WITH PROPOFOL;  Surgeon: Josefine Class, MD;  Location: [facility] ENDOSCOPY;  Service: Endoscopy;  Laterality: N/A;    Prior to Admission  medications   Medication Sig Start Date End Date Taking? Authorizing Provider  acetaminophen (TYLENOL) 500 MG tablet Take 1,000 mg by mouth every 6 (six) hours as needed for mild pain or headache.    [provider]  Cholecalciferol (DIALYVITE VITAMIN D 5000) 125 MCG (5000 UT) capsule Take 5,000 Units by mouth daily.    [provider]  lisinopril-hydrochlorothiazide (PRINZIDE,ZESTORETIC) 10-12.5 MG per tablet Take 1 tablet by mouth daily.    [provider]  multivitamin-iron-minerals-folic acid (CENTRUM) chewable tablet Chew 2 tablets by mouth daily.     [provider]  oxyCODONE-acetaminophen (PERCOCET/ROXICET) 5-325 MG tablet Take 1 tablet by mouth every 4 (four) hours as needed for moderate pain. 09/28/19   Gae Dry, MD    Allergies Patient has no known allergies.  Family History  Problem Relation Age of Onset  . Cancer Father   . Cancer Mother   . Breast cancer Mother 85  . Cancer Sister   . Breast cancer Sister     Social History Social History   Tobacco Use  . Smoking status: Former Smoker    Years: 7.00    Quit date: 09/20/2007    Years since quitting: 12.0  . Smokeless tobacco: Never Used  Substance Use Topics  . Alcohol use: No    Alcohol/week: 0.0 standard drinks  . Drug use: No    Review of Systems Constitutional: Chills, no fever.  Eyes: No visual changes. ENT: No sore throat. Cardiovascular: Denies chest pain. Respiratory: Denies shortness of breath. Gastrointestinal: Persistent mild lower abdominal pain since surgery 6 days ago.  No nausea, vomiting, or diarrhea. Genitourinary: Negative for dysuria. Musculoskeletal: Negative for neck pain.  Negative for back pain. Integumentary: Negative for rash. Neurological: Negative for headaches, focal weakness or numbness.   ____________________________________________   PHYSICAL EXAM:  VITAL SIGNS: ED Triage Vitals  Enc Vitals Group     BP 10/03/19 2214 (!)  156/71     Pulse Rate 10/03/19 2214 86     Resp 10/03/19 2214 20     Temp 10/03/19 2214 98.5 F (36.9 C)     Temp Source 10/03/19 2214 Oral     SpO2 10/03/19 2214 96 %     Weight 10/03/19 2214 116.6 kg (257 lb)     Height 10/03/19 2214 1.676 m (5\' 6" )     Head Circumference --      Peak Flow --      Pain Score 10/03/19 2223 10     Pain Loc --      Pain Edu? --      Excl. in Reform? --     Constitutional: Alert and oriented.  No acute distress at this time. Eyes: Conjunctivae are normal.  Head: Atraumatic. Nose: No congestion/rhinnorhea. Mouth/Throat: Patient is wearing a mask. Neck: No stridor.  No meningeal signs.   Cardiovascular: Normal rate, regular rhythm. Good peripheral circulation. Grossly normal heart sounds. Respiratory: Normal respiratory effort.  No retractions. Gastrointestinal: Obese.  Soft and nondistended.  Nontender to palpation all throughout the abdomen including the lower abdomen.  Well-appearing recent laparoscopic surgical wounds with no dehiscence and no evidence of infection. Musculoskeletal: No lower extremity tenderness nor edema. No gross deformities of extremities. Neurologic:  Normal speech and language. No gross focal neurologic deficits are appreciated.  Skin:  Skin is warm, dry and intact. Psychiatric: Mood and affect are normal. Speech and behavior are normal.  ____________________________________________   LABS (all labs ordered are listed, but only abnormal results are displayed)  Labs Reviewed  COMPREHENSIVE METABOLIC PANEL - Abnormal; Notable for the following components:      Result Value   Glucose, Bld 107 (*)    All other components within normal limits  URINALYSIS, COMPLETE (UACMP) WITH MICROSCOPIC - Abnormal; Notable for the following components:   Color, Urine YELLOW (*)    APPearance CLEAR (*)    Leukocytes,Ua MODERATE (*)    Bacteria, UA RARE (*)    All other components within normal limits  LIPASE, BLOOD  CBC    ____________________________________________  EKG  ED ECG REPORT I, Hinda Kehr, the attending physician, personally viewed and interpreted this ECG.  Date: 10/03/2019 EKG Time: 22: 31 Rate: 87 Rhythm: normal sinus rhythm QRS Axis: normal Intervals: normal ST/T Wave abnormalities: T wave inversion in lead III with other nonspecific changes but no evidence of acute ischemia Narrative Interpretation: no definitive evidence of acute ischemia; does not meet STEMI criteria.   ____________________________________________  RADIOLOGY I, Hinda Kehr, personally viewed and evaluated these images (plain radiographs) as part of my medical decision making, as well as reviewing the written report by the radiologist.  ED MD interpretation: No indication for emergent imaging  Official radiology report(s): No results found.  ____________________________________________   PROCEDURES   Procedure(s) performed (including Critical Care):  Procedures   ____________________________________________   INITIAL IMPRESSION / MDM / ASSESSMENT AND PLAN / ED COURSE  As part of my  medical decision making, I reviewed the following data within the [facility] notes reviewed and incorporated, Labs reviewed , EKG interpreted , Old chart reviewed and Notes from prior ED visits   Differential diagnosis includes, but is not limited to, postvaccination symptoms, acute intra-abdominal postoperative infection, nonspecific viral illness.  The patient is well-appearing and in no distress with normal and stable vital signs.  Her abdominal pain is consistent since the surgery and has not gotten any worse.  She has a benign abdominal exam and normal lab work including no leukocytosis.  Urinalysis is unremarkable and she is not having any dysuria.  I explained to her that I think she is having some symptoms as result of the Covid vaccination but that means it is working.  She also pointed  out that she was diagnosed with COVID-19 about 6 months ago I think that might be why she is showing some symptoms after the first vaccination.  She has a follow-up appointment within about 2 days with Dr. Kenton Kingfisher and I encouraged her to keep that appointment but to return to the ED if she develops new or worsening symptoms.  She understands and agrees with the plan.          ____________________________________________  FINAL CLINICAL IMPRESSION(S) / ED DIAGNOSES  Final diagnoses:  Post-vaccination reaction, initial encounter     MEDICATIONS GIVEN DURING THIS VISIT:  Medications - No data to display   ED Discharge Orders    None      *Please note:  Lane Knighten was evaluated in Emergency Department on 10/04/2019 for the symptoms described in the history of present illness. She was evaluated in the context of the global COVID-19 pandemic, which necessitated consideration that the patient might be at risk for infection with the SARS-CoV-2 virus that causes COVID-19. Institutional protocols and algorithms that pertain to the evaluation of patients at risk for COVID-19 are in a state of rapid change based on information released by regulatory bodies including the CDC and federal and state organizations. These policies and algorithms were followed during the patient's care in the ED.  Some ED evaluations and interventions may be delayed as a result of limited staffing during the pandemic.*  Note:  This document was prepared using Dragon voice recognition software and may include unintentional dictation errors.   Hinda Kehr, MD 10/04/19 [phone]

## 2019-10-06 ENCOUNTER — Ambulatory Visit (INDEPENDENT_AMBULATORY_CARE_PROVIDER_SITE_OTHER): Payer: BC Managed Care – PPO | Admitting: Obstetrics & Gynecology

## 2019-10-06 ENCOUNTER — Encounter: Payer: Self-pay | Admitting: Obstetrics & Gynecology

## 2019-10-06 ENCOUNTER — Other Ambulatory Visit: Payer: Self-pay

## 2019-10-06 VITALS — BP 120/80 | Ht 66.0 in | Wt 258.0 lb

## 2019-10-06 DIAGNOSIS — Z90722 Acquired absence of ovaries, bilateral: Secondary | ICD-10-CM

## 2019-10-06 DIAGNOSIS — Z48816 Encounter for surgical aftercare following surgery on the genitourinary system: Secondary | ICD-10-CM

## 2019-10-06 NOTE — Progress Notes (Signed)
  Postoperative Follow-up Patient presents post op from Lap BSO for ovarian cysts and pain, 2 weeks ago. Path: DIAGNOSIS:  A. OVARY AND FALLOPIAN TUBE, LEFT; SALPINGO-OOPHORECTOMY:  - OVARY: BENIGN BRENNER TUMOR.  - FALLOPIAN TUBE: SEROSAL ADHESIONS.  - NEGATIVE FOR MALIGNANCY.   B. OVARY AND FALLOPIAN TUBE, RIGHT; SALPINGO-OOPHORECTOMY:  - OVARY: BENIGN SIMPLE CYST.  - FALLOPIAN TUBE: NO SIGNIFICANT PATHOLOGIC ALTERATION.  - NEGATIVE FOR MALIGNANCY.  Subjective: Patient reports marked improvement in her preop symptoms. Eating a regular diet without difficulty. Some pain in midline lower abdomen.  Activity: normal activities of daily living. Patient reports additional symptom's since surgery of None.  Objective: BP 120/80   Ht 5\' 6"  (1.676 m)   Wt 258 lb (117 kg)   BMI 41.64 kg/m  Physical Exam Constitutional:      General: She is not in acute distress.    Appearance: She is well-developed.  Cardiovascular:     Rate and Rhythm: Normal rate.  Pulmonary:     Effort: Pulmonary effort is normal.  Abdominal:     General: There is no distension.     Palpations: Abdomen is soft.     Tenderness: There is no abdominal tenderness.     Comments: Incision Healing Well   Musculoskeletal:        General: Normal range of motion.  Neurological:     Mental Status: She is alert and oriented to person, place, and time.     Cranial Nerves: No cranial nerve deficit.  Skin:    General: Skin is warm and dry.     Assessment: s/p :  Lap BSO progressing well  Plan: Patient has done well after surgery with no apparent complications.  I have discussed the post-operative course to date, and the expected progress moving forward.  The patient understands what complications to be concerned about.  I will see the patient in routine follow up, or sooner if needed.    Activity plan: No restriction.  Monitor for hot flashes and consider Tx (Clonidine first line discussed) if persist  Hoyt Koch 10/06/2019, 9:58 AM

## 2019-10-25 ENCOUNTER — Other Ambulatory Visit: Payer: Self-pay

## 2019-10-25 ENCOUNTER — Ambulatory Visit (INDEPENDENT_AMBULATORY_CARE_PROVIDER_SITE_OTHER): Payer: BC Managed Care – PPO | Admitting: Obstetrics & Gynecology

## 2019-10-25 ENCOUNTER — Telehealth: Payer: Self-pay | Admitting: Obstetrics & Gynecology

## 2019-10-25 ENCOUNTER — Encounter: Payer: Self-pay | Admitting: Obstetrics & Gynecology

## 2019-10-25 VITALS — BP 140/80 | Ht 66.0 in | Wt 267.0 lb

## 2019-10-25 DIAGNOSIS — R102 Pelvic and perineal pain: Secondary | ICD-10-CM

## 2019-10-25 MED ORDER — OXYCODONE-ACETAMINOPHEN 5-325 MG PO TABS: 1.0000 | ORAL_TABLET | ORAL | 0 refills | Status: DC | PRN

## 2019-10-25 NOTE — Progress Notes (Signed)
Gynecology Pelvic Pain Evaluation   Chief Complaint:  Chief Complaint  Patient presents with  . Post-op Problem    vaginal pain     History of Present Illness:   Patient is a 54 y.o. No obstetric history on file. who LMP was No LMP recorded. Patient has had a hysterectomy., presents today for a problem visit.  She complains of pain.   Her pain is localized to the suprapubic and deep pelvis area, described as constant, began several days ago and its severity is described as severe. The pain radiates to the  vagina. She has these associated symptoms which include no other sx's; no VB, n/v/c, fever, dysuria. Patient has these modifiers which include pain medication that make it better and unable to associate with any factor that make it worse.  Context includes: she did have laparoscopy (w BSO) on 09/28/19; prior hysterectomy.    PMHx: She  has a past medical history of Dysuria, Family history of adverse reaction to anesthesia, Frequency, HTN (hypertension), Obesity, Overactive bladder, Pre-diabetes, Sensory urge incontinence, and Stress incontinence. Also,  has a past surgical history that includes Abdominal hysterectomy and Colonoscopy with propofol (N/A, 03/24/2015)., family history includes Breast cancer in her sister; Breast cancer (age of onset: 26) in her mother; Cancer in her father, mother, and sister.,  reports that she quit smoking about 12 years ago. She quit after 7.00 years of use. She has never used smokeless tobacco. She reports that she does not drink alcohol or use drugs.  She has a current medication list which includes the following prescription(s): acetaminophen, dialyvite vitamin d 5000, lisinopril-hydrochlorothiazide, multivitamin-iron-minerals-folic acid, and oxycodone-acetaminophen. Also, has No Known Allergies.  Review of Systems  Constitutional: Negative for chills, fever and malaise/fatigue.  HENT: Negative for congestion, sinus pain and sore throat.   Eyes: Negative  for blurred vision and pain.  Respiratory: Negative for cough and wheezing.   Cardiovascular: Negative for chest pain and leg swelling.  Gastrointestinal: Negative for abdominal pain, constipation, diarrhea, heartburn, nausea and vomiting.  Genitourinary: Negative for dysuria, frequency, hematuria and urgency.  Musculoskeletal: Negative for back pain, joint pain, myalgias and neck pain.  Skin: Negative for itching and rash.  Neurological: Negative for dizziness, tremors and weakness.  Endo/Heme/Allergies: Does not bruise/bleed easily.  Psychiatric/Behavioral: Negative for depression. The patient is not nervous/anxious and does not have insomnia.     Objective: BP 140/80   Ht 5\' 6"  (1.676 m)   Wt 267 lb (121.1 kg)   BMI 43.09 kg/m  Physical Exam Constitutional:      General: She is not in acute distress.    Appearance: She is well-developed.  Genitourinary:     Pelvic exam was performed with patient supine.     Urethra, bladder and vagina normal.     No vaginal erythema or bleeding.     Uterus is absent.     Genitourinary Comments: Cuff intact/ no lesions Absent uterus and cervix No bulge, mass Mild T at apex w bimanual pressure  HENT:     Head: Normocephalic and atraumatic.     Nose: Nose normal.  Abdominal:     General: There is no distension.     Palpations: Abdomen is soft.     Tenderness: There is no abdominal tenderness.  Musculoskeletal:        General: Normal range of motion.  Neurological:     Mental Status: She is alert and oriented to person, place, and time.     Cranial Nerves:  No cranial nerve deficit.  Skin:    General: Skin is warm and dry.  Psychiatric:        Attention and Perception: Attention normal.        Mood and Affect: Mood normal.        Speech: Speech normal.        Behavior: Behavior normal.        Cognition and Memory: Cognition normal.        Judgment: Judgment normal.     Female chaperone present for pelvic portion of the physical  exam  Assessment: 54 y.o. No obstetric history on file. with pelvic pain in deep midline pelvis to vagina.  1. Pelvic pain - CT ANGIO ABDOMEN PELVIS  W &/OR WO CONTRAST; Future - Unclear etiology this far out from laparoscopy - Plan investigation w CT  Barnett Applebaum, MD, [facility], [facility] 10/25/2019  1:31 PM

## 2019-10-25 NOTE — Telephone Encounter (Signed)
-----   Message from Gae Dry, MD sent at 10/25/2019  1:30 PM EDT ----- Regarding: Sch CT for pelvic pain, this WEEK S/p laparoscopy 09/28/19

## 2019-10-25 NOTE — Telephone Encounter (Signed)
Called to adv pt that I scheduled CT scan for 4/27 @ 3:30 - pt to arrive at 3:15 at [place]. Clear liquids only 4 hours prior to test.

## 2019-11-02 ENCOUNTER — Other Ambulatory Visit: Payer: Self-pay | Admitting: Obstetrics & Gynecology

## 2019-11-02 ENCOUNTER — Ambulatory Visit
Admission: RE | Admit: 2019-11-02 | Discharge: 2019-11-02 | Disposition: A | Discharge status: Home / Self Care | Payer: BC Managed Care – PPO | Source: Ambulatory Visit | Attending: Obstetrics & Gynecology | Admitting: Obstetrics & Gynecology

## 2019-11-02 ENCOUNTER — Other Ambulatory Visit: Payer: Self-pay

## 2019-11-02 DIAGNOSIS — R102 Pelvic and perineal pain: Secondary | ICD-10-CM | POA: Diagnosis present

## 2019-11-02 MED ORDER — IOHEXOL 300 MG/ML  SOLN
100.0000 mL | Freq: Once | INTRAMUSCULAR | Status: AC | PRN
  Administered 2019-11-02: 100 mL via INTRAVENOUS

## 2019-11-03 ENCOUNTER — Telehealth: Payer: Self-pay | Admitting: Obstetrics & Gynecology

## 2019-11-03 NOTE — Progress Notes (Signed)
Pt aware of normal scan. She is wondering where the pain is coming from. She states it just comes and goes now.

## 2019-11-03 NOTE — Progress Notes (Signed)
No known cause at this time from a Gyn perspective. She can 1. See her PCP and see what options they have. 2. See a pain specialist thru a referral. 3. [facility].

## 2019-11-03 NOTE — Telephone Encounter (Signed)
Patient is calling to follow up on her CT scan results. Please advise ? 

## 2019-11-03 NOTE — Telephone Encounter (Signed)
I apologize I route to the wrong place before

## 2019-11-04 NOTE — Progress Notes (Signed)
Pt states she will talk to her pcp, she is there now

## 2019-11-18 ENCOUNTER — Other Ambulatory Visit: Payer: Self-pay | Admitting: Obstetrics & Gynecology

## 2019-11-18 ENCOUNTER — Telehealth: Payer: Self-pay

## 2019-11-18 MED ORDER — CLONIDINE 0.1 MG/24HR TD PTWK: 0.1000 mg | MEDICATED_PATCH | TRANSDERMAL | 11 refills | Status: DC

## 2019-11-18 NOTE — Telephone Encounter (Signed)
Once weekly patch for hot flashes.  This is not a hormone.  Give 6-8 weeks to see full results.  Can adjust dose or medicine then if needed.  Set up f/u virtual appt for 6 weeks from now.

## 2019-11-18 NOTE — Telephone Encounter (Signed)
Patient reports she's been having a lot of hot flashes. States [facility] said something about putting her on a patch. Cb#[phone]

## 2019-11-18 NOTE — Telephone Encounter (Signed)
LMVM TRC. Advised rx sent in & to call office back to discuss instructions.

## 2019-11-19 NOTE — Telephone Encounter (Signed)
Patient is schedule for 01/03/20 at 10:40

## 2019-11-19 NOTE — Telephone Encounter (Signed)
Patient aware of instructions. Front desk unavailable to schedule apt. Advised patient someone will contact her to set up this apt.

## 2019-11-19 NOTE — Telephone Encounter (Signed)
Patient is calling back to speak with Crystal. Please advise

## 2019-11-19 NOTE — Telephone Encounter (Signed)
Called and left voicemail for patient to call back to be scheduled. 

## 2020-01-03 ENCOUNTER — Ambulatory Visit (INDEPENDENT_AMBULATORY_CARE_PROVIDER_SITE_OTHER): Payer: BC Managed Care – PPO | Admitting: Obstetrics & Gynecology

## 2020-01-03 ENCOUNTER — Encounter: Payer: Self-pay | Admitting: Obstetrics & Gynecology

## 2020-01-03 ENCOUNTER — Other Ambulatory Visit: Payer: Self-pay

## 2020-01-03 VITALS — Ht 66.0 in | Wt 251.0 lb

## 2020-01-03 DIAGNOSIS — R232 Flushing: Secondary | ICD-10-CM | POA: Diagnosis not present

## 2020-01-03 NOTE — Progress Notes (Signed)
Virtual Visit via Telephone Note  I connected with [name] on 01/03/20 at 10:40 AM EDT by telephone and verified that I am speaking with the correct person using two identifiers.   I discussed the limitations, risks, security and privacy concerns of performing an evaluation and management service by telephone and the availability of in person appointments. I also discussed with the patient that there may be a patient responsible charge related to this service. The patient expressed understanding and agreed to proceed. She was at home and I was in my office.  History of Present Illness:  Sherry Hernandez is a 54 y.o. who was started on Clonidine patch  approximately 6 weeks ago. Since that time, she states that her symptoms are improving.  Improved hot flashes, no dizziness or nausea or CP/SOB.  Would like to continue w this medicine.  Patch works well.  PMHx: She  has a past medical history of Dysuria, Family history of adverse reaction to anesthesia, Frequency, HTN (hypertension), Obesity, Overactive bladder, Pre-diabetes, Sensory urge incontinence, and Stress incontinence. Also,  has a past surgical history that includes Abdominal hysterectomy and Colonoscopy with propofol (N/A, 03/24/2015)., family history includes Breast cancer in her sister; Breast cancer (age of onset: 17) in her mother; Cancer in her father, mother, and sister.,  reports that she quit smoking about 12 years ago. She quit after 7.00 years of use. She has never used smokeless tobacco. She reports that she does not drink alcohol and does not use drugs. Current Meds  Medication Sig  . acetaminophen (TYLENOL) 500 MG tablet Take 1,000 mg by mouth every 6 (six) hours as needed for mild pain or headache.  . Cholecalciferol (DIALYVITE VITAMIN D 5000) 125 MCG (5000 UT) capsule Take 5,000 Units by mouth daily.  . cloNIDine (CATAPRES - DOSED IN MG/24 HR) 0.1 mg/24hr patch Place 1 patch (0.1 mg total) onto the skin once a week.  Marland Kitchen  lisinopril-hydrochlorothiazide (PRINZIDE,ZESTORETIC) 10-12.5 MG per tablet Take 1 tablet by mouth daily.  . multivitamin-iron-minerals-folic acid (CENTRUM) chewable tablet Chew 2 tablets by mouth daily.   Marland Kitchen oxyCODONE-acetaminophen (PERCOCET/ROXICET) 5-325 MG tablet Take 1 tablet by mouth every 4 (four) hours as needed for moderate pain.  . Also, has No Known Allergies..  Review of Systems  All other systems reviewed and are negative.   Observations/Objective: No exam today, due to telephone eVisit due to [facility] virus restriction on elective visits and procedures.  Prior visits reviewed along with ultrasounds/labs as indicated.  Assessment and Plan:   ICD-10-CM   1. Vasomotor flushing  R23.2   Cont Clonidine patch weekly  Follow Up Instructions: PRN and for annual   I discussed the assessment and treatment plan with the patient. The patient was provided an opportunity to ask questions and all were answered. The patient agreed with the plan and demonstrated an understanding of the instructions.   The patient was advised to call back or seek an in-person evaluation if the symptoms worsen or if the condition fails to improve as anticipated.  I provided 15 minutes of non-face-to-face time during this encounter.   Hoyt Koch, MD

## 2020-04-28 ENCOUNTER — Other Ambulatory Visit: Payer: Self-pay | Admitting: Family Medicine

## 2020-04-28 DIAGNOSIS — Z1231 Encounter for screening mammogram for malignant neoplasm of breast: Secondary | ICD-10-CM

## 2020-06-07 ENCOUNTER — Ambulatory Visit
Admission: RE | Admit: 2020-06-07 | Discharge: 2020-06-07 | Disposition: A | Discharge status: Home / Self Care | Payer: BC Managed Care – PPO | Source: Ambulatory Visit | Attending: Family Medicine | Admitting: Family Medicine

## 2020-06-07 ENCOUNTER — Other Ambulatory Visit: Payer: Self-pay

## 2020-06-07 DIAGNOSIS — Z1231 Encounter for screening mammogram for malignant neoplasm of breast: Secondary | ICD-10-CM | POA: Diagnosis present

## 2020-07-22 ENCOUNTER — Other Ambulatory Visit: Payer: BC Managed Care – PPO

## 2020-07-22 ENCOUNTER — Other Ambulatory Visit: Payer: Self-pay

## 2020-07-22 DIAGNOSIS — Z20822 Contact with and (suspected) exposure to covid-19: Secondary | ICD-10-CM

## 2020-07-25 LAB — NOVEL CORONAVIRUS, NAA: SARS-CoV-2, NAA: NOT DETECTED

## 2020-07-26 ENCOUNTER — Telehealth: Payer: Self-pay | Admitting: *Deleted

## 2020-07-26 NOTE — Telephone Encounter (Signed)
4 Pt notified of negative COVID-19 results. Understanding verbalized.

## 2020-08-25 ENCOUNTER — Emergency Department
Admission: EM | Admit: 2020-08-25 | Discharge: 2020-08-25 | Disposition: A | Discharge status: Home / Self Care | Payer: BC Managed Care – PPO | Attending: Emergency Medicine | Admitting: Emergency Medicine

## 2020-08-25 ENCOUNTER — Emergency Department: Payer: BC Managed Care – PPO

## 2020-08-25 ENCOUNTER — Other Ambulatory Visit: Payer: Self-pay

## 2020-08-25 DIAGNOSIS — I10 Essential (primary) hypertension: Secondary | ICD-10-CM | POA: Insufficient documentation

## 2020-08-25 DIAGNOSIS — R1084 Generalized abdominal pain: Secondary | ICD-10-CM | POA: Diagnosis present

## 2020-08-25 DIAGNOSIS — Z79899 Other long term (current) drug therapy: Secondary | ICD-10-CM | POA: Diagnosis not present

## 2020-08-25 DIAGNOSIS — Z20822 Contact with and (suspected) exposure to covid-19: Secondary | ICD-10-CM | POA: Insufficient documentation

## 2020-08-25 DIAGNOSIS — R600 Localized edema: Secondary | ICD-10-CM | POA: Insufficient documentation

## 2020-08-25 DIAGNOSIS — Z8616 Personal history of COVID-19: Secondary | ICD-10-CM | POA: Diagnosis not present

## 2020-08-25 DIAGNOSIS — Z87891 Personal history of nicotine dependence: Secondary | ICD-10-CM | POA: Insufficient documentation

## 2020-08-25 DIAGNOSIS — B349 Viral infection, unspecified: Secondary | ICD-10-CM | POA: Diagnosis not present

## 2020-08-25 LAB — CBC WITH DIFFERENTIAL/PLATELET
Abs Immature Granulocytes: 0.01 10*3/uL (ref 0.00–0.07)
Basophils Absolute: 0 10*3/uL (ref 0.0–0.1)
Basophils Relative: 1 %
Eosinophils Absolute: 0.1 10*3/uL (ref 0.0–0.5)
Eosinophils Relative: 2 %
HCT: 38.3 % (ref 36.0–46.0)
Hemoglobin: 12.6 g/dL (ref 12.0–15.0)
Immature Granulocytes: 0 %
Lymphocytes Relative: 29 %
Lymphs Abs: 1.5 10*3/uL (ref 0.7–4.0)
MCH: 29.2 pg (ref 26.0–34.0)
MCHC: 32.9 g/dL (ref 30.0–36.0)
MCV: 88.7 fL (ref 80.0–100.0)
Monocytes Absolute: 0.4 10*3/uL (ref 0.1–1.0)
Monocytes Relative: 8 %
Neutro Abs: 3.2 10*3/uL (ref 1.7–7.7)
Neutrophils Relative %: 60 %
Platelets: 206 10*3/uL (ref 150–400)
RBC: 4.32 MIL/uL (ref 3.87–5.11)
RDW: 14.2 % (ref 11.5–15.5)
WBC: 5.3 10*3/uL (ref 4.0–10.5)
nRBC: 0 % (ref 0.0–0.2)

## 2020-08-25 LAB — URINALYSIS, COMPLETE (UACMP) WITH MICROSCOPIC
Bilirubin Urine: NEGATIVE
Glucose, UA: NEGATIVE mg/dL
Hgb urine dipstick: NEGATIVE
Ketones, ur: NEGATIVE mg/dL
Nitrite: NEGATIVE
Protein, ur: NEGATIVE mg/dL
Specific Gravity, Urine: 1.02 (ref 1.005–1.030)
pH: 5 (ref 5.0–8.0)

## 2020-08-25 LAB — COMPREHENSIVE METABOLIC PANEL
ALT: 33 U/L (ref 0–44)
AST: 27 U/L (ref 15–41)
Albumin: 3.5 g/dL (ref 3.5–5.0)
Alkaline Phosphatase: 79 U/L (ref 38–126)
Anion gap: 9 (ref 5–15)
BUN: 17 mg/dL (ref 6–20)
CO2: 24 mmol/L (ref 22–32)
Calcium: 9.2 mg/dL (ref 8.9–10.3)
Chloride: 107 mmol/L (ref 98–111)
Creatinine, Ser: 0.83 mg/dL (ref 0.44–1.00)
GFR, Estimated: >60 mL/min (ref >60)
Glucose, Bld: 122 mg/dL — ABNORMAL HIGH (ref 70–99)
Potassium: 3.9 mmol/L (ref 3.5–5.1)
Sodium: 140 mmol/L (ref 135–145)
Total Bilirubin: 0.5 mg/dL (ref 0.3–1.2)
Total Protein: 6.5 g/dL (ref 6.5–8.1)

## 2020-08-25 LAB — RESP PANEL BY RT-PCR (FLU A&B, COVID) ARPGX2
Influenza A by PCR: NEGATIVE
Influenza B by PCR: NEGATIVE
SARS Coronavirus 2 by RT PCR: NEGATIVE

## 2020-08-25 LAB — POC SARS CORONAVIRUS 2 AG -  ED: SARS Coronavirus 2 Ag: NEGATIVE

## 2020-08-25 LAB — LIPASE, BLOOD: Lipase: 38 U/L (ref 11–51)

## 2020-08-25 MED ORDER — DICYCLOMINE HCL 10 MG PO CAPS: 10.0000 mg | ORAL_CAPSULE | Freq: Four times a day (QID) | ORAL | 0 refills | Status: DC | PRN

## 2020-08-25 MED ORDER — FAMOTIDINE 20 MG PO TABS
40.0000 mg | ORAL_TABLET | Freq: Once | ORAL | Status: AC
  Administered 2020-08-25: 40 mg via ORAL
  Filled 2020-08-25: qty 2

## 2020-08-25 MED ORDER — FAMOTIDINE 20 MG PO TABS: 20.0000 mg | ORAL_TABLET | Freq: Two times a day (BID) | ORAL | 0 refills | Status: DC

## 2020-08-25 MED ORDER — ONDANSETRON 4 MG PO TBDP: 4.0000 mg | ORAL_TABLET | Freq: Three times a day (TID) | ORAL | 0 refills | Status: DC | PRN

## 2020-08-25 MED ORDER — ONDANSETRON 4 MG PO TBDP
8.0000 mg | ORAL_TABLET | Freq: Once | ORAL | Status: AC
  Administered 2020-08-25: 8 mg via ORAL
  Filled 2020-08-25: qty 2

## 2020-08-25 NOTE — ED Notes (Addendum)
Pt given ginger ale to drink  Pt informed that we need a urine sample. Pt will call out when she can provide sample.

## 2020-08-25 NOTE — ED Provider Notes (Signed)
[facility] Emergency Department Provider Note  ____________________________________________  Time seen: Approximately 8:22 AM  I have reviewed the triage vital signs and the nursing notes.   HISTORY  Chief Complaint Numbness, Abdominal Pain, and Dizziness    HPI Sherry Hernandez is a 55 y.o. female with a history of hypertension who comes to the ED with multiple complaints including generalized abdominal pain with nausea and diarrhea for the past 2 days, associated with generalized headache, fatigue, body aches, chills, fever of 101 degrees last night.  She notes that the person she lives with is also currently sick with similar symptoms for the past several days.  Symptoms are constant, waxing and waning, no aggravating or alleviating factors.  Still tolerating fluids at home.  The patient reports having COVID vaccine and booster previously.      Past Medical History:  Diagnosis Date  . Dysuria   . Family history of adverse reaction to anesthesia    MOTHER PONV  . Frequency   . HTN (hypertension)   . Obesity   . Overactive bladder   . Pre-diabetes   . Sensory urge incontinence   . Stress incontinence      Patient Active Problem List   Diagnosis Date Noted  . S/P bilateral oophorectomy 10/06/2019  . Pelvic pain 07/28/2019  . Ovarian cyst, left 07/28/2019  . S/P hysterectomy 07/28/2019  . Pneumonia due to COVID-19 virus 03/21/2019  . Uncontrollable vomiting   . Intractable nausea and vomiting 07/22/2015     Past Surgical History:  Procedure Laterality Date  . ABDOMINAL HYSTERECTOMY    . COLONOSCOPY WITH PROPOFOL N/A 03/24/2015   Procedure: COLONOSCOPY WITH PROPOFOL;  Surgeon: Josefine Class, MD;  Location: [facility] ENDOSCOPY;  Service: Endoscopy;  Laterality: N/A;     Prior to Admission medications   Medication Sig Start Date End Date Taking? Authorizing Provider  dicyclomine (BENTYL) 10 MG capsule Take 1 capsule (10 mg total) by  mouth every 6 (six) hours as needed for up to 7 days for spasms (abdominal pain). 08/25/20 09/01/20 Yes Carrie Mew, MD  famotidine (PEPCID) 20 MG tablet Take 1 tablet (20 mg total) by mouth 2 (two) times daily. 08/25/20  Yes Carrie Mew, MD  ondansetron (ZOFRAN ODT) 4 MG disintegrating tablet Take 1 tablet (4 mg total) by mouth every 8 (eight) hours as needed for nausea or vomiting. 08/25/20  Yes Carrie Mew, MD  acetaminophen (TYLENOL) 500 MG tablet Take 1,000 mg by mouth every 6 (six) hours as needed for mild pain or headache.    [provider]  Cholecalciferol (DIALYVITE VITAMIN D 5000) 125 MCG (5000 UT) capsule Take 5,000 Units by mouth daily.    [provider]  cloNIDine (CATAPRES - DOSED IN MG/24 HR) 0.1 mg/24hr patch Place 1 patch (0.1 mg total) onto the skin once a week. 11/18/19   Gae Dry, MD  lisinopril-hydrochlorothiazide (PRINZIDE,ZESTORETIC) 10-12.5 MG per tablet Take 1 tablet by mouth daily.    [provider]  multivitamin-iron-minerals-folic acid (CENTRUM) chewable tablet Chew 2 tablets by mouth daily.     [provider]  oxyCODONE-acetaminophen (PERCOCET/ROXICET) 5-325 MG tablet Take 1 tablet by mouth every 4 (four) hours as needed for moderate pain. 10/25/19   Gae Dry, MD     Allergies Patient has no known allergies.   Family History  Problem Relation Age of Onset  . Cancer Father   . Cancer Mother   . Breast cancer Mother 8  . Cancer Sister   .  Breast cancer Sister     Social History Social History   Tobacco Use  . Smoking status: Former Smoker    Years: 7.00    Quit date: 09/20/2007    Years since quitting: 12.9  . Smokeless tobacco: Never Used  Vaping Use  . Vaping Use: Never used  Substance Use Topics  . Alcohol use: No    Alcohol/week: 0.0 standard drinks  . Drug use: No    Review of Systems  Constitutional: Positive fever and chills.  Positive body aches and fatigue ENT:   No  sore throat. No rhinorrhea. Cardiovascular:   No chest pain or syncope. Respiratory: Positive shortness of breath and nonproductive cough. Gastrointestinal:   Positive as above for generalized abdominal pain, nausea, diarrhea Musculoskeletal:   Negative for focal pain or swelling All other systems reviewed and are negative except as documented above in ROS and HPI.  ____________________________________________   PHYSICAL EXAM:  VITAL SIGNS: ED Triage Vitals  Enc Vitals Group     BP 08/25/20 0804 (!) 160/78     Pulse Rate 08/25/20 0804 73     Resp 08/25/20 0804 20     Temp 08/25/20 0804 98.1 F (36.7 C)     Temp Source 08/25/20 0804 Oral     SpO2 --      Weight 08/25/20 0804 260 lb (117.9 kg)     Height 08/25/20 0804 5\' 6"  (1.676 m)     Head Circumference --      Peak Flow --      Pain Score 08/25/20 0804 9     Pain Loc --      Pain Edu? --      Excl. in [place]? --     Vital signs reviewed, nursing assessments reviewed.   Constitutional:   Alert and oriented. Non-toxic appearance. Eyes:   Conjunctivae are normal. EOMI. PERRL. ENT      Head:   Normocephalic and atraumatic.      Nose:   Wearing a mask.      Mouth/Throat:   Wearing a mask.      Neck:   No meningismus. Full ROM. Hematological/Lymphatic/Immunilogical:   No cervical lymphadenopathy. Cardiovascular:   RRR. Symmetric bilateral radial and DP pulses.  No murmurs. Cap refill less than 2 seconds. Respiratory:   Normal respiratory effort without tachypnea/retractions. Breath sounds are clear and equal bilaterally. No wheezes/rales/rhonchi. Gastrointestinal:   Soft without focal tenderness. Non distended. There is no CVA tenderness.  No rebound, rigidity, or guarding.  Musculoskeletal:   Normal range of motion in all extremities. No joint effusions.  No lower extremity tenderness. Symmetric 1+ pitting edema bilateral lower extremities Neurologic:   Normal speech and language.  Motor grossly intact. No acute focal  neurologic deficits are appreciated.  Skin:    Skin is warm, dry and intact. No rash noted.  No petechiae, purpura, or bullae.  ____________________________________________    LABS (pertinent positives/negatives) (all labs ordered are listed, but only abnormal results are displayed) Labs Reviewed  COMPREHENSIVE METABOLIC PANEL - Abnormal; Notable for the following components:      Result Value   Glucose, Bld 122 (*)    All other components within normal limits  URINALYSIS, COMPLETE (UACMP) WITH MICROSCOPIC - Abnormal; Notable for the following components:   Color, Urine YELLOW (*)    APPearance HAZY (*)    Leukocytes,Ua TRACE (*)    Bacteria, UA RARE (*)    All other components within normal limits  RESP PANEL BY  RT-PCR (FLU A&B, COVID) ARPGX2  LIPASE, BLOOD  CBC WITH DIFFERENTIAL/PLATELET  POC SARS CORONAVIRUS 2 AG -  ED   ____________________________________________   EKG    ____________________________________________    RADIOLOGY  DG Chest Portable 1 View  Result Date: 08/25/2020 CLINICAL DATA:  Abdominal pain EXAM: PORTABLE CHEST 1 VIEW COMPARISON:  March 24, 2019, January 13, 2017 FINDINGS: The cardiomediastinal silhouette is unchanged in contour. No pleural effusion. No pneumothorax. No acute pleuroparenchymal abnormality. Visualized abdomen is unremarkable. Degenerative changes of the acromioclavicular joints. IMPRESSION: No acute cardiopulmonary abnormality. Electronically Signed   By: Valentino Saxon MD   On: 08/25/2020 08:33    ____________________________________________   PROCEDURES Procedures  ____________________________________________  DIFFERENTIAL DIAGNOSIS   Viral illness, Covid, flu, pneumonia, UTI, dehydration, electrolyte abnormality  CLINICAL IMPRESSION / ASSESSMENT AND PLAN / ED COURSE  Medications ordered in the ED: Medications  ondansetron (ZOFRAN-ODT) disintegrating tablet 8 mg (8 mg Oral Given 08/25/20 0827)  famotidine (PEPCID)  tablet 40 mg (40 mg Oral Given 08/25/20 0827)    Pertinent labs & imaging results that were available during my care of the patient were reviewed by me and considered in my medical decision making (see chart for details).  Sherry Hernandez was evaluated in Emergency Department on 08/25/2020 for the symptoms described in the history of present illness. She was evaluated in the context of the global COVID-19 pandemic, which necessitated consideration that the patient might be at risk for infection with the SARS-CoV-2 virus that causes COVID-19. Institutional protocols and algorithms that pertain to the evaluation of patients at risk for COVID-19 are in a state of rapid change based on information released by regulatory bodies including the CDC and federal and state organizations. These policies and algorithms were followed during the patient's care in the ED.   Patient presents with multiple symptoms most consistent with viral syndrome.  Will give Zofran, Pepcid for symptom relief, obtain labs, Covid testing, chest x-ray.  ----------------------------------------- 9:51 AM on 08/25/2020 -----------------------------------------  Work-up negative, vitals remain essentially normal.  Tolerating p.o., stable for discharge      ____________________________________________   FINAL CLINICAL IMPRESSION(S) / ED DIAGNOSES    Final diagnoses:  Acute viral syndrome     ED Discharge Orders         Ordered    ondansetron (ZOFRAN ODT) 4 MG disintegrating tablet  Every 8 hours PRN        08/25/20 0950    dicyclomine (BENTYL) 10 MG capsule  Every 6 hours PRN        08/25/20 0950    famotidine (PEPCID) 20 MG tablet  2 times daily        08/25/20 0950          Portions of this note were generated with dragon dictation software. Dictation errors may occur despite best attempts at proofreading.   Carrie Mew, MD 08/25/20 [phone]

## 2020-08-25 NOTE — ED Triage Notes (Signed)
Pt c/o generalized abd pain with diarrhea and a HA for the past 2 days, states she had a low grade temp 100.3 last night, today having dizziness with high b/p and numbness around her mouth.Marland Kitchen

## 2020-08-25 NOTE — ED Notes (Signed)
Pt has been drinking ginger ale without emesis. Dr. Joni Fears aware.

## 2020-08-25 NOTE — Discharge Instructions (Addendum)
Your blood test, Covid test, and flu test were all normal today.  This appears to be a routine viral illness, which may last several days but should resolve on its own.  Take medications to help control your symptoms and focus on staying well-hydrated and allow yourself plenty of rest.  You can take anti-inflammatory medicine such as Tylenol or ibuprofen to help with pain as well.

## 2020-08-25 NOTE — ED Notes (Signed)
Pt is tolerating PO intake well.

## 2020-08-25 NOTE — ED Notes (Signed)
Pt family at bedside

## 2021-02-15 ENCOUNTER — Telehealth: Payer: BC Managed Care – PPO

## 2021-02-15 NOTE — Telephone Encounter (Signed)
Sch pt fro annual/ follow up appt

## 2021-02-15 NOTE — Telephone Encounter (Signed)
Pt left msg on triage saying she is having abdominal pain. Called her back to get more details and she said she is having the same pelvic pain she had when her ovaries were removed. Pain has been going on for the past week.

## 2021-02-16 NOTE — Telephone Encounter (Signed)
Patient is scheduled for 02/27/21 with [facility]

## 2021-02-27 ENCOUNTER — Encounter: Payer: Self-pay | Admitting: Obstetrics & Gynecology

## 2021-02-27 ENCOUNTER — Ambulatory Visit (INDEPENDENT_AMBULATORY_CARE_PROVIDER_SITE_OTHER): Payer: BC Managed Care – PPO | Admitting: Obstetrics & Gynecology

## 2021-02-27 ENCOUNTER — Other Ambulatory Visit: Payer: Self-pay

## 2021-02-27 VITALS — BP 130/80 | Ht 66.0 in | Wt 290.0 lb

## 2021-02-27 DIAGNOSIS — Z01419 Encounter for gynecological examination (general) (routine) without abnormal findings: Secondary | ICD-10-CM | POA: Diagnosis not present

## 2021-02-27 DIAGNOSIS — Z1231 Encounter for screening mammogram for malignant neoplasm of breast: Secondary | ICD-10-CM | POA: Diagnosis not present

## 2021-02-27 DIAGNOSIS — R102 Pelvic and perineal pain: Secondary | ICD-10-CM | POA: Diagnosis not present

## 2021-02-27 DIAGNOSIS — N3281 Overactive bladder: Secondary | ICD-10-CM | POA: Diagnosis not present

## 2021-02-27 NOTE — Patient Instructions (Signed)
PAP every three years Mammogram every year    Call [phone] to schedule at [facility] in December Colonoscopy every 10 years Labs yearly (with PCP)  Thank you for choosing [facility]. As part of our ongoing efforts to improve patient experience, we would appreciate your feedback. Please fill out the short survey that you will receive by mail or MyChart. Your opinion is important to [place]! - Dr. Kenton Kingfisher

## 2021-02-27 NOTE — Progress Notes (Signed)
HPI:      [name] is a 55 y.o. No obstetric history on file. who LMP was in the past, she presents today for her annual examination.  The patient has no complaints today. The patient is not sexually active. Herlast pap: was normal and last mammogram: approximate date 06/2020 and was normal.  The patient does perform self breast exams.  There is notable family history of breast or ovarian cancer in her family. The patient is not taking hormone replacement therapy. Patient denies post-menopausal vaginal bleeding.   The patient has regular exercise: yes. The patient denies current symptoms of depression.    Pt reports contuned lower abdominal pains, same as last year, no radiation or associated sx's or changes.  No modifiers.  Also reports nocturia and frequency (10 times per night).  LOU at times.  PMHx: Past Medical History:  Diagnosis Date   Dysuria    Family history of adverse reaction to anesthesia    MOTHER PONV   Frequency    HTN (hypertension)    Obesity    Overactive bladder    Pre-diabetes    Sensory urge incontinence    Stress incontinence    Past Surgical History:  Procedure Laterality Date   ABDOMINAL HYSTERECTOMY     COLONOSCOPY WITH PROPOFOL N/A 03/24/2015   Procedure: COLONOSCOPY WITH PROPOFOL;  Surgeon: Josefine Class, MD;  Location: [facility] ENDOSCOPY;  Service: Endoscopy;  Laterality: N/A;   Family History  Problem Relation Age of Onset   Cancer Father    Cancer Mother    Breast cancer Mother 28   Cancer Sister    Breast cancer Sister    Social History   Tobacco Use   Smoking status: Former    Years: 7.00    Types: Cigarettes    Quit date: 09/20/2007    Years since quitting: 13.4   Smokeless tobacco: Never  Vaping Use   Vaping Use: Never used  Substance Use Topics   Alcohol use: No    Alcohol/week: 0.0 standard drinks   Drug use: No    Current Outpatient Medications:    lisinopril-hydrochlorothiazide (PRINZIDE,ZESTORETIC) 10-12.5 MG  per tablet, Take 1 tablet by mouth daily., Disp: , Rfl:    multivitamin-iron-minerals-folic acid (CENTRUM) chewable tablet, Chew 2 tablets by mouth daily. , Disp: , Rfl:    acetaminophen (TYLENOL) 500 MG tablet, Take 1,000 mg by mouth every 6 (six) hours as needed for mild pain or headache. (Patient not taking: Reported on 02/27/2021), Disp: , Rfl:    Cholecalciferol (DIALYVITE VITAMIN D 5000) 125 MCG (5000 UT) capsule, Take 5,000 Units by mouth daily. (Patient not taking: Reported on 02/27/2021), Disp: , Rfl:    dicyclomine (BENTYL) 10 MG capsule, Take 1 capsule (10 mg total) by mouth every 6 (six) hours as needed for up to 7 days for spasms (abdominal pain)., Disp: 20 capsule, Rfl: 0   famotidine (PEPCID) 20 MG tablet, Take 1 tablet (20 mg total) by mouth 2 (two) times daily. (Patient not taking: Reported on 02/27/2021), Disp: 60 tablet, Rfl: 0   ondansetron (ZOFRAN ODT) 4 MG disintegrating tablet, Take 1 tablet (4 mg total) by mouth every 8 (eight) hours as needed for nausea or vomiting. (Patient not taking: Reported on 02/27/2021), Disp: 20 tablet, Rfl: 0 Allergies: Patient has no known allergies.  Review of Systems  Constitutional:  Negative for chills, fever and malaise/fatigue.  HENT:  Negative for congestion, sinus pain and sore throat.   Eyes:  Negative for  blurred vision and pain.  Respiratory:  Negative for cough and wheezing.   Cardiovascular:  Negative for chest pain and leg swelling.  Gastrointestinal:  Positive for diarrhea. Negative for abdominal pain, constipation, heartburn, nausea and vomiting.  Genitourinary:  Positive for frequency. Negative for dysuria, hematuria and urgency.  Musculoskeletal:  Negative for back pain, joint pain, myalgias and neck pain.  Skin:  Negative for itching and rash.  Neurological:  Negative for dizziness, tremors and weakness.  Endo/Heme/Allergies:  Does not bruise/bleed easily.  Psychiatric/Behavioral:  Positive for depression. The patient is not  nervous/anxious and does not have insomnia.    Objective: BP 130/80   Ht '5\' 6"'$  (1.676 m)   Wt 290 lb (131.5 kg)   BMI 46.81 kg/m   Filed Weights   02/27/21 1522  Weight: 290 lb (131.5 kg)   Body mass index is 46.81 kg/m. Physical Exam Constitutional:      General: She is not in acute distress.    Appearance: She is well-developed.  Genitourinary:     Vulva, bladder, rectum and urethral meatus normal.     No lesions in the vagina.     Genitourinary Comments: Vaginal cuff well healed     Right Labia: No rash, tenderness or lesions.    Left Labia: No tenderness, lesions or rash.    No vaginal bleeding.      Right Adnexa: not tender and no mass present.    Left Adnexa: not tender and no mass present.    Cervix is absent.     Uterus is absent.     Pelvic exam was performed with patient in the lithotomy position.  Breasts:    Right: No mass, skin change or tenderness.     Left: No mass, skin change or tenderness.  HENT:     Head: Normocephalic and atraumatic. No laceration.     Right Ear: Hearing normal.     Left Ear: Hearing normal.     Mouth/Throat:     Pharynx: Uvula midline.  Eyes:     Pupils: Pupils are equal, round, and reactive to light.  Neck:     Thyroid: No thyromegaly.  Cardiovascular:     Rate and Rhythm: Normal rate and regular rhythm.     Heart sounds: No murmur heard.   No friction rub. No gallop.  Pulmonary:     Effort: Pulmonary effort is normal. No respiratory distress.     Breath sounds: Normal breath sounds. No wheezing.  Abdominal:     General: Bowel sounds are normal. There is no distension.     Palpations: Abdomen is soft.     Tenderness: There is no abdominal tenderness. There is no rebound.  Musculoskeletal:        General: Normal range of motion.     Cervical back: Normal range of motion and neck supple.  Neurological:     Mental Status: She is alert and oriented to person, place, and time.     Cranial Nerves: No cranial nerve deficit.   Skin:    General: Skin is warm and dry.  Psychiatric:        Judgment: Judgment normal.  Vitals reviewed.    Assessment: Annual Exam 1. Women's annual routine gynecological examination   2. Pelvic pain   3. Overactive bladder   4. Encounter for screening mammogram for malignant neoplasm of breast     Plan:            1.  Vaginal Screening-  Pap  smear schedule reviewed with patient  2. Breast screening- Exam annually and mammogram scheduled  3. Colonoscopy every 10 years, Hemoccult testing after age 49  4. Labs managed by PCP  5. Counseling for hormonal therapy: none No Clonidine needed, not helpful last year   Referrals for OAB (uro) and chronic pain after prior surgery (pain clinic).    F/U  Return in about 1 year (around 02/27/2022) for Annual.  Barnett Applebaum, MD, [facility], [facility] 02/27/2021  4:03 PM

## 2021-03-19 ENCOUNTER — Other Ambulatory Visit: Payer: Self-pay | Admitting: Obstetrics & Gynecology

## 2021-03-19 DIAGNOSIS — R102 Pelvic and perineal pain: Secondary | ICD-10-CM

## 2021-03-21 ENCOUNTER — Ambulatory Visit: Payer: No Typology Code available for payment source | Admitting: Urology

## 2021-05-10 ENCOUNTER — Other Ambulatory Visit: Payer: Self-pay | Admitting: Family Medicine

## 2021-05-10 ENCOUNTER — Ambulatory Visit
Admission: RE | Admit: 2021-05-10 | Discharge: 2021-05-10 | Disposition: A | Discharge status: Home / Self Care | Payer: BC Managed Care – PPO | Attending: Family Medicine | Admitting: Family Medicine

## 2021-05-10 ENCOUNTER — Ambulatory Visit
Admission: RE | Admit: 2021-05-10 | Discharge: 2021-05-10 | Disposition: A | Discharge status: Home / Self Care | Payer: BC Managed Care – PPO | Source: Ambulatory Visit | Attending: Family Medicine | Admitting: Family Medicine

## 2021-05-10 DIAGNOSIS — M25562 Pain in left knee: Secondary | ICD-10-CM

## 2021-06-13 ENCOUNTER — Other Ambulatory Visit: Payer: Self-pay

## 2021-06-13 ENCOUNTER — Ambulatory Visit
Admission: RE | Admit: 2021-06-13 | Discharge: 2021-06-13 | Disposition: A | Discharge status: Home / Self Care | Payer: BC Managed Care – PPO | Source: Ambulatory Visit | Attending: Obstetrics & Gynecology | Admitting: Obstetrics & Gynecology

## 2021-06-13 DIAGNOSIS — Z1231 Encounter for screening mammogram for malignant neoplasm of breast: Secondary | ICD-10-CM | POA: Diagnosis present

## 2021-07-23 ENCOUNTER — Other Ambulatory Visit: Payer: Self-pay | Admitting: Obstetrics & Gynecology

## 2021-07-26 ENCOUNTER — Other Ambulatory Visit: Payer: Self-pay | Admitting: Student

## 2021-07-26 DIAGNOSIS — M25462 Effusion, left knee: Secondary | ICD-10-CM

## 2021-07-26 DIAGNOSIS — M1712 Unilateral primary osteoarthritis, left knee: Secondary | ICD-10-CM

## 2021-07-26 DIAGNOSIS — M25562 Pain in left knee: Secondary | ICD-10-CM

## 2021-07-30 ENCOUNTER — Ambulatory Visit
Admission: RE | Admit: 2021-07-30 | Discharge: 2021-07-30 | Disposition: A | Discharge status: Home / Self Care | Payer: BC Managed Care – PPO | Source: Ambulatory Visit | Attending: Student | Admitting: Student

## 2021-07-30 DIAGNOSIS — M25562 Pain in left knee: Secondary | ICD-10-CM

## 2021-07-30 DIAGNOSIS — M1712 Unilateral primary osteoarthritis, left knee: Secondary | ICD-10-CM

## 2021-07-30 DIAGNOSIS — M25462 Effusion, left knee: Secondary | ICD-10-CM

## 2021-09-10 ENCOUNTER — Other Ambulatory Visit: Payer: Self-pay | Admitting: Physician Assistant

## 2021-09-10 DIAGNOSIS — Z1231 Encounter for screening mammogram for malignant neoplasm of breast: Secondary | ICD-10-CM

## 2021-11-02 ENCOUNTER — Other Ambulatory Visit: Payer: Self-pay | Admitting: Student

## 2021-11-02 ENCOUNTER — Ambulatory Visit
Admission: RE | Admit: 2021-11-02 | Discharge: 2021-11-02 | Disposition: A | Discharge status: Home / Self Care | Payer: BC Managed Care – PPO | Source: Ambulatory Visit | Attending: Student | Admitting: Student

## 2021-11-02 DIAGNOSIS — M7989 Other specified soft tissue disorders: Secondary | ICD-10-CM | POA: Insufficient documentation

## 2022-06-25 ENCOUNTER — Ambulatory Visit
Admission: RE | Admit: 2022-06-25 | Discharge: 2022-06-25 | Disposition: A | Discharge status: Home / Self Care | Payer: BC Managed Care – PPO | Source: Ambulatory Visit | Attending: Physician Assistant | Admitting: Physician Assistant

## 2022-06-25 DIAGNOSIS — Z1231 Encounter for screening mammogram for malignant neoplasm of breast: Secondary | ICD-10-CM | POA: Diagnosis present

## 2022-08-14 ENCOUNTER — Other Ambulatory Visit: Payer: Self-pay

## 2022-08-14 ENCOUNTER — Emergency Department
Admission: EM | Admit: 2022-08-14 | Discharge: 2022-08-14 | Disposition: A | Discharge status: Home / Self Care | Payer: BC Managed Care – PPO | Attending: Emergency Medicine | Admitting: Emergency Medicine

## 2022-08-14 ENCOUNTER — Emergency Department: Payer: BC Managed Care – PPO

## 2022-08-14 DIAGNOSIS — R509 Fever, unspecified: Secondary | ICD-10-CM | POA: Diagnosis present

## 2022-08-14 DIAGNOSIS — U071 COVID-19: Secondary | ICD-10-CM | POA: Diagnosis not present

## 2022-08-14 LAB — RESP PANEL BY RT-PCR (RSV, FLU A&B, COVID)  RVPGX2
Influenza A by PCR: NEGATIVE
Influenza B by PCR: NEGATIVE
Resp Syncytial Virus by PCR: NEGATIVE
SARS Coronavirus 2 by RT PCR: POSITIVE — AB

## 2022-08-14 MED ORDER — GUAIFENESIN-CODEINE 100-10 MG/5ML PO SOLN
5.0000 mL | ORAL | 0 refills | Status: DC | PRN
Start: 2022-08-14 — End: 2023-03-12

## 2022-08-14 NOTE — ED Provider Notes (Signed)
[facility] Provider Note    Event Date/Time   First MD Initiated Contact with Patient 08/14/22 1017     (approximate)   History   Fever, Cough, and Shortness of Breath   HPI  Sherry Hernandez is a 57 y.o. female   to the ED with complaint of persistent cough.  Patient states that she was diagnosed with COVID 3 days ago and that her cough is increased.  She reports that she is short of breath and has not been able to sleep during the night because of cough.  Patient denies any previous history of asthma and is a non-smoker.  Patient reports his third time she has had COVID and also has been vaccinated.      Physical Exam   Triage Vital Signs: ED Triage Vitals  Enc Vitals Group     BP 08/14/22 1011 (!) 146/78     Pulse Rate 08/14/22 1011 75     Resp 08/14/22 1011 (!) 22     Temp 08/14/22 1011 98.3 F (36.8 C)     Temp Source 08/14/22 1011 Oral     SpO2 08/14/22 1011 98 %     Weight 08/14/22 1012 247 lb (112 kg)     Height 08/14/22 1012 '5\' 6"'$  (1.676 m)     Head Circumference --      Peak Flow --      Pain Score 08/14/22 1012 10     Pain Loc --      Pain Edu? --      Excl. in [place]? --     Most recent vital signs: Vitals:   08/14/22 1011  BP: (!) 146/78  Pulse: 75  Resp: (!) 22  Temp: 98.3 F (36.8 C)  SpO2: 98%     General: Awake, no distress.  Able to talk in complete sentences without any difficulty. CV:  Good peripheral perfusion.  Heart regular rate and rhythm. Resp:  Normal effort.  Clear bilaterally. Abd:  No distention.  Other:     ED Results / Procedures / Treatments   Labs (all labs ordered are listed, but only abnormal results are displayed) Labs Reviewed  RESP PANEL BY RT-PCR (RSV, FLU A&B, COVID)  [id number] - Abnormal; Notable for the following components:      Result Value   SARS Coronavirus 2 by RT PCR POSITIVE (*)    All other components within normal limits      RADIOLOGY Chest x-ray images were reviewed by  myself independent of the radiologist and no infiltrate was noted.  Chest x-ray per radiologist shows no active cardiopulmonary disease.    PROCEDURES:  Critical Care performed:   Procedures   MEDICATIONS ORDERED IN ED: Medications - No data to display   IMPRESSION / MDM / [place] / ED COURSE  I reviewed the triage vital signs and the nursing notes.   Differential diagnosis includes, but is not limited to, COVID, influenza, RSV, bronchitis, pneumonia, viral upper respiratory infection.  57 year old female presents to the ED with complaint of cough and congestion and continued fever of 100.2 this morning.  Patient has been taking ibuprofen.  She was diagnosed with COVID on Monday and prescribed Paxlovid which she has been taking twice a day and thought she should be feeling better at this time.  Currently patient is afebrile with an O2 sat of 98% and is able to talk in complete sentences without any shortness of breath.  Chest x-ray was reassuring  and patient was made aware that there is no pneumonia at this time.  We discussed deep breaths frequently and to try and stay active.  She was also given strict return precautions should she develop any shortness of breath or difficulty breathing.      Patient's presentation is most consistent with acute complicated illness / injury requiring diagnostic workup.  FINAL CLINICAL IMPRESSION(S) / ED DIAGNOSES   Final diagnoses:  COVID     Rx / DC Orders   ED Discharge Orders          Ordered    guaiFENesin-codeine 100-10 MG/5ML syrup  Every 4 hours PRN        08/14/22 1151             Note:  This document was prepared using Dragon voice recognition software and may include unintentional dictation errors.   [name], PA-C 08/14/22 1411    Lavonia Drafts, MD 08/14/22 [phone]

## 2022-08-14 NOTE — ED Triage Notes (Signed)
Pt reports that she was diagnosed with covid on Monday. Pt states that her coughing has increased as well as her sob, reports fever of 100.2 this am and she took ibuprofen, pt reports that this is her third time having covid and states that she has been vaccinated with boosters

## 2022-08-14 NOTE — Discharge Instructions (Addendum)
The medication that you are taking is called Paxlovid that was prescribed by your primary care provider.  This medication is for COVID and should be taken as directed.  A prescription for a codeine cough medication was sent to the pharmacy to take as needed for cough.  Be aware that the codeine is a narcotic and could cause drowsiness which may increase your risk for injury.  Do not take this medication and drive or operate machinery.  Continue drinking fluids to stay hydrated and also taking deep breaths frequently to help prevent pneumonia.  Return to the emergency department if any severe worsening of your symptoms or urgent concerns.  COVID test at the emergency department also is positive.

## 2022-11-21 ENCOUNTER — Ambulatory Visit: Payer: Medicaid Other | Admitting: Obstetrics and Gynecology

## 2022-11-21 ENCOUNTER — Encounter: Payer: Self-pay | Admitting: Obstetrics and Gynecology

## 2022-11-21 VITALS — BP 143/81 | HR 78 | Ht 66.0 in | Wt 250.3 lb

## 2022-11-21 DIAGNOSIS — R350 Frequency of micturition: Secondary | ICD-10-CM

## 2022-11-21 DIAGNOSIS — N3946 Mixed incontinence: Secondary | ICD-10-CM | POA: Diagnosis not present

## 2022-11-21 NOTE — Progress Notes (Signed)
Patient presents today to discuss urinary frequency and incontinence. She states this has been an ongoing problem for her over the past few years.

## 2022-11-21 NOTE — Progress Notes (Signed)
HPI:      [name] is a 57 y.o. No obstetric history on file. who LMP was No LMP recorded. Patient has had a hysterectomy.  Subjective:   She presents today stating that she continues to have problems with her bladder.  She has overactive bladder and gets up about 10 times per night to urinate.  She reports that she stopped drinking sometimes at 5 PM and this makes no difference.  She has a previous history of urge and stress incontinence and has previously seen urology in [place].  She has received Botox bladder injections in the past. Of significant note patient has lost 60 pounds being seen at the "weight loss clinic" .    Hx: The following portions of the patient's history were reviewed and updated as appropriate:             She  has a past medical history of Dysuria, Family history of adverse reaction to anesthesia, Frequency, HTN (hypertension), Obesity, Overactive bladder, Pre-diabetes, Sensory urge incontinence, and Stress incontinence. She does not have any pertinent problems on file. She  has a past surgical history that includes Abdominal hysterectomy and Colonoscopy with propofol (N/A, 03/24/2015). Her family history includes Breast cancer in her sister; Breast cancer (age of onset: 6) in her mother; Cancer in her father, mother, and sister. She  reports that she quit smoking about 15 years ago. Her smoking use included cigarettes. She has never used smokeless tobacco. She reports that she does not drink alcohol and does not use drugs. She has a current medication list which includes the following prescription(s): acetaminophen, dialyvite vitamin d 5000, famotidine, guaifenesin-codeine, lisinopril-hydrochlorothiazide, multivitamin-iron-minerals-folic acid, ondansetron, and dicyclomine. She has No Known Allergies.       Review of Systems:  Review of Systems  Constitutional: Denied constitutional symptoms, night sweats, recent illness, fatigue, fever, insomnia and weight  loss.  Eyes: Denied eye symptoms, eye pain, photophobia, vision change and visual disturbance.  Ears/Nose/Throat/Neck: Denied ear, nose, throat or neck symptoms, hearing loss, nasal discharge, sinus congestion and sore throat.  Cardiovascular: Denied cardiovascular symptoms, arrhythmia, chest pain/pressure, edema, exercise intolerance, orthopnea and palpitations.  Respiratory: Denied pulmonary symptoms, asthma, pleuritic pain, productive sputum, cough, dyspnea and wheezing.  Gastrointestinal: Denied, gastro-esophageal reflux, melena, nausea and vomiting.  Genitourinary: See HPI for additional information.  Musculoskeletal: Denied musculoskeletal symptoms, stiffness, swelling, muscle weakness and myalgia.  Dermatologic: Denied dermatology symptoms, rash and scar.  Neurologic: Denied neurology symptoms, dizziness, headache, neck pain and syncope.  Psychiatric: Denied psychiatric symptoms, anxiety and depression.  Endocrine: Denied endocrine symptoms including hot flashes and night sweats.   Meds:   Current Outpatient Medications on File Prior to Visit  Medication Sig Dispense Refill   acetaminophen (TYLENOL) 500 MG tablet Take 1,000 mg by mouth every 6 (six) hours as needed for mild pain or headache.     Cholecalciferol (DIALYVITE VITAMIN D 5000) 125 MCG (5000 UT) capsule Take 5,000 Units by mouth daily.     famotidine (PEPCID) 20 MG tablet Take 1 tablet (20 mg total) by mouth 2 (two) times daily. 60 tablet 0   guaiFENesin-codeine 100-10 MG/5ML syrup Take 5 mLs by mouth every 4 (four) hours as needed. 120 mL 0   lisinopril-hydrochlorothiazide (PRINZIDE,ZESTORETIC) 10-12.5 MG per tablet Take 1 tablet by mouth daily.     multivitamin-iron-minerals-folic acid (CENTRUM) chewable tablet Chew 2 tablets by mouth daily.      ondansetron (ZOFRAN ODT) 4 MG disintegrating tablet Take 1 tablet (4 mg total)  by mouth every 8 (eight) hours as needed for nausea or vomiting. 20 tablet 0   dicyclomine (BENTYL)  10 MG capsule Take 1 capsule (10 mg total) by mouth every 6 (six) hours as needed for up to 7 days for spasms (abdominal pain). 20 capsule 0   No current facility-administered medications on file prior to visit.      Objective:     Vitals:   11/21/22 1457  BP: (!) 143/81  Pulse: 78   Filed Weights   11/21/22 1457  Weight: 250 lb 4.8 oz (113.5 kg)                        Assessment:    No obstetric history on file. Patient Active Problem List   Diagnosis Date Noted   S/P bilateral oophorectomy 10/06/2019   Pelvic pain 07/28/2019   Ovarian cyst, left 07/28/2019   S/P hysterectomy 07/28/2019   Pneumonia due to COVID-19 virus 03/21/2019   Uncontrollable vomiting    Intractable nausea and vomiting 07/22/2015     1. Urinary frequency   2. Mixed stress and urge urinary incontinence     Patient has a long history of bladder issues including mixed incontinence with significant urge.  Has previously had Botox bladder injections.   Plan:            1.  I believe patient needs a referral to urology for further workup and treatment.  This is beyond my scope of urologic practice.  We have discussed this in some detail and she would like a referral to urology. Orders Orders Placed This Encounter  Procedures   Ambulatory referral to Urology    No orders of the defined types were placed in this encounter.     F/U  No follow-ups on file. I spent 23 minutes involved in the care of this patient preparing to see the patient by obtaining and reviewing her medical history (including labs, imaging tests and prior procedures), documenting clinical information in the electronic health record (EHR), counseling and coordinating care plans, writing and sending prescriptions, ordering tests or procedures and in direct communicating with the patient and medical staff discussing pertinent items from her history and physical exam.  Elonda Husky, M.D. 11/21/2022 3:14 PM

## 2022-11-28 ENCOUNTER — Other Ambulatory Visit: Payer: Self-pay | Admitting: Family Medicine

## 2022-11-28 DIAGNOSIS — M7989 Other specified soft tissue disorders: Secondary | ICD-10-CM

## 2022-11-28 DIAGNOSIS — S40022A Contusion of left upper arm, initial encounter: Secondary | ICD-10-CM

## 2022-11-29 ENCOUNTER — Ambulatory Visit
Admission: RE | Admit: 2022-11-29 | Discharge: 2022-11-29 | Disposition: A | Discharge status: Home / Self Care | Payer: Medicaid Other | Source: Ambulatory Visit | Attending: Family Medicine | Admitting: Family Medicine

## 2022-11-29 DIAGNOSIS — M7989 Other specified soft tissue disorders: Secondary | ICD-10-CM | POA: Insufficient documentation

## 2022-11-29 DIAGNOSIS — S40022A Contusion of left upper arm, initial encounter: Secondary | ICD-10-CM | POA: Insufficient documentation

## 2023-01-18 ENCOUNTER — Emergency Department
Admission: EM | Admit: 2023-01-18 | Discharge: 2023-01-18 | Disposition: A | Discharge status: Home / Self Care | Payer: Medicaid Other | Attending: Emergency Medicine | Admitting: Emergency Medicine

## 2023-01-18 ENCOUNTER — Emergency Department: Payer: Medicaid Other

## 2023-01-18 ENCOUNTER — Other Ambulatory Visit: Payer: Self-pay

## 2023-01-18 DIAGNOSIS — M7122 Synovial cyst of popliteal space [Baker], left knee: Secondary | ICD-10-CM | POA: Diagnosis not present

## 2023-01-18 DIAGNOSIS — M25562 Pain in left knee: Secondary | ICD-10-CM | POA: Diagnosis present

## 2023-01-18 LAB — CBC WITH DIFFERENTIAL/PLATELET
Abs Immature Granulocytes: 0.01 10*3/uL (ref 0.00–0.07)
Basophils Absolute: 0 10*3/uL (ref 0.0–0.1)
Basophils Relative: 1 %
Eosinophils Absolute: 0.1 10*3/uL (ref 0.0–0.5)
Eosinophils Relative: 2 %
HCT: 39.8 % (ref 36.0–46.0)
Hemoglobin: 13.2 g/dL (ref 12.0–15.0)
Immature Granulocytes: 0 %
Lymphocytes Relative: 29 %
Lymphs Abs: 1.5 10*3/uL (ref 0.7–4.0)
MCH: 28.9 pg (ref 26.0–34.0)
MCHC: 33.2 g/dL (ref 30.0–36.0)
MCV: 87.3 fL (ref 80.0–100.0)
Monocytes Absolute: 0.5 10*3/uL (ref 0.1–1.0)
Monocytes Relative: 9 %
Neutro Abs: 3.1 10*3/uL (ref 1.7–7.7)
Neutrophils Relative %: 59 %
Platelets: 187 10*3/uL (ref 150–400)
RBC: 4.56 MIL/uL (ref 3.87–5.11)
RDW: 14 % (ref 11.5–15.5)
WBC: 5.2 10*3/uL (ref 4.0–10.5)
nRBC: 0 % (ref 0.0–0.2)

## 2023-01-18 LAB — COMPREHENSIVE METABOLIC PANEL
ALT: 20 U/L (ref 0–44)
AST: 21 U/L (ref 15–41)
Albumin: 3.2 g/dL — ABNORMAL LOW (ref 3.5–5.0)
Alkaline Phosphatase: 67 U/L (ref 38–126)
Anion gap: 7 (ref 5–15)
BUN: 17 mg/dL (ref 6–20)
CO2: 26 mmol/L (ref 22–32)
Calcium: 8.9 mg/dL (ref 8.9–10.3)
Chloride: 107 mmol/L (ref 98–111)
Creatinine, Ser: 0.88 mg/dL (ref 0.44–1.00)
GFR, Estimated: >60 mL/min (ref >60)
Glucose, Bld: 121 mg/dL — ABNORMAL HIGH (ref 70–99)
Potassium: 3.6 mmol/L (ref 3.5–5.1)
Sodium: 140 mmol/L (ref 135–145)
Total Bilirubin: 0.5 mg/dL (ref 0.3–1.2)
Total Protein: 5.9 g/dL — ABNORMAL LOW (ref 6.5–8.1)

## 2023-01-18 MED ORDER — ACETAMINOPHEN 325 MG PO TABS
650.0000 mg | ORAL_TABLET | Freq: Once | ORAL | Status: AC
  Administered 2023-01-18: 650 mg via ORAL
  Filled 2023-01-18: qty 2

## 2023-01-18 NOTE — ED Triage Notes (Signed)
Pt co if left leg/knee swelling x over a week. Pt states she is supposed to get steroid shots but appointments have been too far apart in between. Pt states she is able to ambulate.

## 2023-01-18 NOTE — Discharge Instructions (Signed)
You are found to have a Baker's cyst which is likely contributing to your pain and swelling.  Please follow-up with orthopedics.  Please return for any new, worsening, or change in symptoms or other concerns.  You may wear the Ace wrap during the day, please remove it at night.  It was a pleasure caring for you today.

## 2023-01-18 NOTE — ED Provider Notes (Signed)
[facility] Provider Note    Event Date/Time   First MD Initiated Contact with Patient 01/18/23 1420     (approximate)   History   Leg Swelling   HPI  Sherry Hernandez is a 57 y.o. female with a past medical history of obesity who presents today for evaluation of left knee pain.  Patient reports that she normally has fluid drained off of her knee with orthopedics most recently on 11/18/2022, and is back because she has pain and swelling in her leg and her knee.  This has been ongoing for the past 1 week.  She has not noticed any redness or warmth to the knee.  She is still able to ambulate.  She has not had any fevers or chills.  No history of PE or DVT.  Patient Active Problem List   Diagnosis Date Noted   S/P bilateral oophorectomy 10/06/2019   Pelvic pain 07/28/2019   Ovarian cyst, left 07/28/2019   S/P hysterectomy 07/28/2019   Pneumonia due to COVID-19 virus 03/21/2019   Uncontrollable vomiting    Intractable nausea and vomiting 07/22/2015          Physical Exam   Triage Vital Signs: ED Triage Vitals  Encounter Vitals Group     BP 01/18/23 1406 (!) 143/74     Systolic BP Percentile --      Diastolic BP Percentile --      Pulse Rate 01/18/23 1406 78     Resp 01/18/23 1406 16     Temp 01/18/23 1406 98.5 F (36.9 C)     Temp Source 01/18/23 1406 Oral     SpO2 01/18/23 1406 95 %     Weight 01/18/23 1406 258 lb (117 kg)     Height 01/18/23 1406 5\' 6"  (1.676 m)     Head Circumference --      Peak Flow --      Pain Score 01/18/23 1412 10     Pain Loc --      Pain Education --      Exclude from Growth Chart --     Most recent vital signs: Vitals:   01/18/23 1406  BP: (!) 143/74  Pulse: 78  Resp: 16  Temp: 98.5 F (36.9 C)  SpO2: 95%    Physical Exam Vitals and nursing note reviewed.  Constitutional:      General: Awake and alert. No acute distress.    Appearance: Normal appearance. The patient is normal weight.  HENT:      Head: Normocephalic and atraumatic.     Mouth: Mucous membranes are moist.  Eyes:     General: PERRL. Normal EOMs        Right eye: No discharge.        Left eye: No discharge.     Conjunctiva/sclera: Conjunctivae normal.  Cardiovascular:     Rate and Rhythm: Normal rate and regular rhythm.     Pulses: Normal pulses.  Pulmonary:     Effort: Pulmonary effort is normal. No respiratory distress.     Breath sounds: Normal breath sounds.  Abdominal:     Abdomen is soft. There is no abdominal tenderness. No rebound or guarding. No distention. Musculoskeletal:        General: No swelling. Normal range of motion.     Cervical back: Normal range of motion and neck supple.  Left knee: no deformity or rash. Mild medial and lateral joint line tenderness. No patellar tenderness, no ballotment Warm and  well perfused extremity with 2+ pedal pulses 5/5 strength to dorsiflexion and plantarflexion at the ankle with intact sensation throughout extremity Normal range of motion of the knee, with intact flexion and extension to active and passive range of motion, ROM limited slightly due to pain. Extensor mechanism intact. No ligamentous laxity. Negative anterior/posterior drawer/negative lachman, negative mcmurrays. No varus or valgus deformity. No erythema or warmth Intact quadriceps, hamstring function, patellar tendon function Pelvis stable Full ROM of ankle without pain or swelling Foot warm and well perfused Skin:    General: Skin is warm and dry.     Capillary Refill: Capillary refill takes less than 2 seconds.     Findings: No rash.  Neurological:     Mental Status: The patient is awake and alert.      ED Results / Procedures / Treatments   Labs (all labs ordered are listed, but only abnormal results are displayed) Labs Reviewed  COMPREHENSIVE METABOLIC PANEL - Abnormal; Notable for the following components:      Result Value   Glucose, Bld 121 (*)    Total Protein 5.9 (*)    Albumin  3.2 (*)    All other components within normal limits  CBC WITH DIFFERENTIAL/PLATELET     EKG     RADIOLOGY I independently reviewed and interpreted imaging and agree with radiologists findings.     PROCEDURES:  Critical Care performed:   Procedures   MEDICATIONS ORDERED IN ED: Medications  acetaminophen (TYLENOL) tablet 650 mg (650 mg Oral Given 01/18/23 1647)     IMPRESSION / MDM / ASSESSMENT AND PLAN / ED COURSE  I reviewed the triage vital signs and the nursing notes.   Differential diagnosis includes, but is not limited to, effusion, DVT, dependent edema.  I reviewed the patient's chart.  Patient was seen by the orthopedic department on 11/18/2022 at which time she had an injection in her knee.  She also had an MRI of her knee that demonstrated a medial meniscus tear in addition to underlying osteoarthritic changes.  Patient is awake and alert, hemodynamically stable and afebrile.  She is able to flex and extend her knee, though has moderate discomfort with range of motion.  She has no pain with axial loading.  There is no warmth or erythema to the knee, no fever, I do not suspect septic joint at this time.  Labs obtained in triage.  X-ray and ultrasound also obtained for further evaluation.  X-ray reveals only a small joint effusion, and DVT ultrasound reveals a large Baker's cyst.  I discussed these findings with the patient.  She was given an Ace wrap for extra support and compression, advised to wear during the day and remove it at night.  She was instructed to follow-up with orthopedics for further management.  We also discussed return precautions.  Patient understands and agrees with plan.  She was discharged in stable condition.   Patient's presentation is most consistent with acute complicated illness / injury requiring diagnostic workup.    FINAL CLINICAL IMPRESSION(S) / ED DIAGNOSES   Final diagnoses:  Baker cyst, left     Rx / DC Orders   ED  Discharge Orders     None        Note:  This document was prepared using Dragon voice recognition software and may include unintentional dictation errors.   [name] 01/18/23 1733    Dionne Bucy, MD 01/18/23 [phone]

## 2023-01-20 ENCOUNTER — Emergency Department: Payer: Medicaid Other

## 2023-01-20 ENCOUNTER — Emergency Department
Admission: EM | Admit: 2023-01-20 | Discharge: 2023-01-20 | Disposition: A | Discharge status: Home / Self Care | Payer: Medicaid Other | Attending: Emergency Medicine | Admitting: Emergency Medicine

## 2023-01-20 DIAGNOSIS — R200 Anesthesia of skin: Secondary | ICD-10-CM | POA: Insufficient documentation

## 2023-01-20 DIAGNOSIS — M79605 Pain in left leg: Secondary | ICD-10-CM

## 2023-01-20 DIAGNOSIS — M7122 Synovial cyst of popliteal space [Baker], left knee: Secondary | ICD-10-CM

## 2023-01-20 DIAGNOSIS — Z79899 Other long term (current) drug therapy: Secondary | ICD-10-CM

## 2023-01-20 DIAGNOSIS — Z9889 Other specified postprocedural states: Secondary | ICD-10-CM

## 2023-01-20 DIAGNOSIS — Z87891 Personal history of nicotine dependence: Secondary | ICD-10-CM

## 2023-01-20 DIAGNOSIS — S8412XA Injury of peroneal nerve at lower leg level, left leg, initial encounter: Secondary | ICD-10-CM

## 2023-01-20 MED ORDER — IOHEXOL 350 MG/ML SOLN: 150.0000 mL | Freq: Once | INTRAVENOUS | Status: DC | PRN

## 2023-01-20 MED ORDER — IOHEXOL 350 MG/ML SOLN
125.0000 mL | Freq: Once | INTRAVENOUS | Status: AC | PRN
  Administered 2023-01-20: 125 mL via INTRAVENOUS

## 2023-01-20 MED ORDER — ONDANSETRON HCL 4 MG/2ML IJ SOLN
4.0000 mg | Freq: Once | INTRAMUSCULAR | Status: AC
  Administered 2023-01-20: 4 mg via INTRAVENOUS
  Filled 2023-01-20: qty 2

## 2023-01-20 NOTE — ED Triage Notes (Addendum)
Pt arrived POV for left leg pain/numbness that has been recurrent for several months, pt reports was seen a few days ago for a cyst to her left leg and receives steroids injection for her left leg pain/discomfort. Pt reports if the injections don't work then she will need a knee replacement. Pt is having difficulty applying weight to that her left leg as the pain is increasing. NAD noted, VSS, A&O x4. Left leg slightly cooler to touch, pedal pulse present +3, sensation intact. DVT study 7/13 that showed no evidence of DVT within the left lower extremity

## 2023-01-20 NOTE — ED Notes (Signed)
Pt had a burp "out of nowhere" and was asked if they want to sit up. Pt responded that they are feeling like they may throw up. Pt was given an emesis bag and the stretcher was put into an upright position. Pt started to dry heave and continued to dry heave for 5+ minutes. Pt stated that their stomach started to feel weird when she got the saline. MD made aware.

## 2023-01-20 NOTE — Discharge Instructions (Addendum)
You were seen in the emergency department today for evaluation of your leg pain.  I suspect that the Baker's cyst that you have may be compressing a nerve in your leg called the peroneal nerve and your symptoms.  Please keep your follow-up with orthopedics.  Have also included follow-up information with neurology for further evaluation.  Return to the ER for new or worsening symptoms.

## 2023-01-20 NOTE — Consult Note (Signed)
Hospital Consult    Reason for Consult:  Pain to left lower extremity Requesting Physician:  Dr Alvy Beal MD MRN #:  [id number]  History of Present Illness: This is a 57 y.o. female with a past medical history of obesity who presents today for evaluation of left knee pain. Patient reports that she normally has fluid drained off of her knee with orthopedics most recently on 11/18/2022, and is back because she has pain and swelling in her leg and her knee.   On exam this morning the patient endorses sharp shooting pains from her knee down to her toes.  Doppler at the bedside and all was used and shows the patient has very strong Doppler dorsalis pedis and posttibial pulses.  Patient endorses she had fluid drained off of her left knee but was unaware that she has a cyst about the size of 4 cm.  Patient was nauseated just prior to me seeing her with dry heaving but endorses it is better now.  Past Medical History:  Diagnosis Date   Dysuria    Family history of adverse reaction to anesthesia    MOTHER PONV   Frequency    HTN (hypertension)    Obesity    Overactive bladder    Pre-diabetes    Sensory urge incontinence    Stress incontinence     Past Surgical History:  Procedure Laterality Date   ABDOMINAL HYSTERECTOMY     COLONOSCOPY WITH PROPOFOL N/A 03/24/2015   Procedure: COLONOSCOPY WITH PROPOFOL;  Surgeon: Elnita Maxwell, MD;  Location: [facility] ENDOSCOPY;  Service: Endoscopy;  Laterality: N/A;    No Known Allergies  Prior to Admission medications   Medication Sig Start Date End Date Taking? Authorizing Provider  acetaminophen (TYLENOL) 500 MG tablet Take 1,000 mg by mouth every 6 (six) hours as needed for mild pain or headache.    [provider]  Cholecalciferol (DIALYVITE VITAMIN D 5000) 125 MCG (5000 UT) capsule Take 5,000 Units by mouth daily.    [provider]  dicyclomine (BENTYL) 10 MG capsule Take 1 capsule (10 mg total) by mouth every 6 (six) hours  as needed for up to 7 days for spasms (abdominal pain). 08/25/20 09/01/20  Sharman Cheek, MD  famotidine (PEPCID) 20 MG tablet Take 1 tablet (20 mg total) by mouth 2 (two) times daily. 08/25/20   Sharman Cheek, MD  guaiFENesin-codeine 100-10 MG/5ML syrup Take 5 mLs by mouth every 4 (four) hours as needed. 08/14/22   Tommi Rumps, PA-C  lisinopril-hydrochlorothiazide (PRINZIDE,ZESTORETIC) 10-12.5 MG per tablet Take 1 tablet by mouth daily.    [provider]  multivitamin-iron-minerals-folic acid (CENTRUM) chewable tablet Chew 2 tablets by mouth daily.     [provider]  ondansetron (ZOFRAN ODT) 4 MG disintegrating tablet Take 1 tablet (4 mg total) by mouth every 8 (eight) hours as needed for nausea or vomiting. 08/25/20   Sharman Cheek, MD    Social History   Socioeconomic History   Marital status: Divorced    Spouse name: Not on file   Number of children: Not on file   Years of education: Not on file   Highest education level: Not on file  Occupational History   Not on file  Tobacco Use   Smoking status: Former    Current packs/day: 0.00    Types: Cigarettes    Start date: 09/19/2000    Quit date: 09/20/2007    Years since quitting: 15.3   Smokeless tobacco: Never  Vaping Use  Vaping status: Never Used  Substance and Sexual Activity   Alcohol use: No    Alcohol/week: 0.0 standard drinks of alcohol   Drug use: No   Sexual activity: Not Currently    Birth control/protection: Surgical    Comment: hysterectomy  Other Topics Concern   Not on file  Social History Narrative   Not on file   Social Determinants of Health   Financial Resource Strain: Not on file  Food Insecurity: Not on file  Transportation Needs: Not on file  Physical Activity: Unknown (03/21/2019)   Exercise Vital Sign    Days of Exercise per Week: Patient declined    Minutes of Exercise per Session: Patient declined  Stress: Not on file  Social Connections: Unknown (03/21/2019)    Social Connection and Isolation Panel [NHANES]    Frequency of Communication with Friends and Family: More than three times a week    Frequency of Social Gatherings with Friends and Family: Three times a week    Attends Religious Services: Patient declined    Active Member of Clubs or Organizations: Patient declined    Attends Banker Meetings: Patient declined    Marital Status: Patient declined  Intimate Partner Violence: Not At Risk (03/21/2019)   Humiliation, Afraid, Rape, and Kick questionnaire    Fear of Current or Ex-Partner: No    Emotionally Abused: No    Physically Abused: No    Sexually Abused: No     Family History  Problem Relation Age of Onset   Cancer Mother    Breast cancer Mother 56   Cancer Father    Cancer Sister    Breast cancer Sister        27    ROS: Otherwise negative unless mentioned in HPI  Physical Examination  Vitals:   01/20/23 0130 01/20/23 0726  BP: (!) 160/84 (!) 117/41  Pulse: 77 68  Resp: 18 16  Temp: 98.5 F (36.9 C) 98.5 F (36.9 C)  SpO2: 99% 100%   Body mass index is 41.32 kg/m.  General:  WDWN in NAD Gait: Not observed HENT: WNL, normocephalic Pulmonary: normal non-labored breathing, without Rales, rhonchi,  wheezing Cardiac: regular, without  Murmurs, rubs or gallops; without carotid bruits Abdomen: Positive bowel sounds, soft, NT/ND, no masses Skin: without rashes Vascular Exam/Pulses: Bilateral Strong lower extremity doppler pulses. Extremities: without ischemic changes, without Gangrene , with cellulitis; without open wounds;  Musculoskeletal: no muscle wasting or atrophy  Neurologic: A&O X 3;  No focal weakness or paresthesias are detected; speech is fluent/normal Psychiatric:  The pt has Normal affect. Lymph:  Unremarkable  CBC    Component Value Date/Time   WBC 5.2 01/18/2023 1402   RBC 4.56 01/18/2023 1402   HGB 13.2 01/18/2023 1402   HGB 13.9 04/08/2013 1925   HCT 39.8 01/18/2023 1402   HCT  40.1 04/08/2013 1925   PLT 187 01/18/2023 1402   PLT 198 04/08/2013 1925   MCV 87.3 01/18/2023 1402   MCV 88 04/08/2013 1925   MCH 28.9 01/18/2023 1402   MCHC 33.2 01/18/2023 1402   RDW 14.0 01/18/2023 1402   RDW 13.8 04/08/2013 1925   LYMPHSABS 1.5 01/18/2023 1402   MONOABS 0.5 01/18/2023 1402   EOSABS 0.1 01/18/2023 1402   BASOSABS 0.0 01/18/2023 1402    BMET    Component Value Date/Time   NA 140 01/18/2023 1402   NA 137 04/08/2013 1925   K 3.6 01/18/2023 1402   K 3.3 (L) 04/08/2013 1925  CL 107 01/18/2023 1402   CL 103 04/08/2013 1925   CO2 26 01/18/2023 1402   CO2 26 04/08/2013 1925   GLUCOSE 121 (H) 01/18/2023 1402   GLUCOSE 124 (H) 04/08/2013 1925   BUN 17 01/18/2023 1402   BUN 16 04/08/2013 1925   CREATININE 0.88 01/18/2023 1402   CREATININE 0.96 04/08/2013 1925   CALCIUM 8.9 01/18/2023 1402   CALCIUM 9.5 04/08/2013 1925   GFRNONAA >60 01/18/2023 1402   GFRNONAA >60 04/08/2013 1925   GFRAA >60 10/03/2019 2253   GFRAA >60 04/08/2013 1925    COAGS: No results found for: "INR", "PROTIME"   Non-Invasive Vascular Imaging:   EXAM:01/20/2023 CT ANGIOGRAPHY OF ABDOMINAL AORTA WITH ILIOFEMORAL RUNOFF   TECHNIQUE: Multidetector CT imaging of the abdomen, pelvis and lower extremities was performed using the standard protocol during bolus administration of intravenous contrast. Multiplanar CT image reconstructions and MIPs were obtained to evaluate the vascular anatomy.   RADIATION DOSE REDUCTION: This exam was performed according to the departmental dose-optimization program which includes automated exposure control, adjustment of the mA and/or kV according to patient size and/or use of iterative reconstruction technique.   CONTRAST:  OMNIPAQUE IOHEXOL 350 MG/ML SOLN   COMPARISON:  No prior similar study. Comparison is made with CT abdomen and pelvis with IV contrast 11/02/2019.   FINDINGS: VASCULAR   Aorta: Trace calcific plaque of the abdominal  aortic bifurcation. Otherwise the abdominal aorta is normal.   Celiac: Normal.   SMA: Normal.   Renals: Right renal artery is single. Lower thirds the dominant artery feeding the upper and midpole and a very small caliber accessory artery arising in cm distal to it supplying a portion of the lower pole. All 3 arteries are widely patent without visible disease.   IMA: Normal.   RIGHT Lower Extremity   Inflow: There are trace calcific plaques in the right common iliac artery. The internal and external iliac arteries are normal.   Outflow: Common, superficial and profunda femoral arteries and the popliteal artery are patent without evidence of aneurysm, dissection, vasculitis or significant stenosis.   Runoff: Patent three vessel runoff to the ankle.   LEFT Lower Extremity   Inflow: There are trace calcific plaques in the common iliac artery. The internal and external iliac arteries are normal.   Outflow: Common, superficial and profunda femoral arteries and the popliteal artery are patent without evidence of aneurysm, dissection, vasculitis or significant stenosis.   Runoff: There is uninterrupted anterior and posterior tibial artery enhancement and two-vessel runoff into the foot. The peroneal artery does not opacify in the distal fourth of the foreleg, and could be occluded or could simply terminate in a muscular perforator. The anterior and posterior tibial artery runoff is better demonstrated on delayed images than initially.   Veins: The heart is slightly enlarged. There is no pericardial effusion. The lung bases show mosaicism which could be due to low inspiration or due to small airways disease with air trapping.   Review of the MIP images confirms the above findings.   NON-VASCULAR   Lower chest: The liver is 23 cm length and mildly steatotic. No mass is seen. Surgically absent gallbladder again noted without bile duct dilatation.   Hepatobiliary: No  abnormality.   Pancreas: No abnormality.   Spleen: Normal in size without focal abnormality.   Adrenals/Urinary Tract: Adrenal glands are unremarkable. Kidneys are normal, without renal calculi, focal lesion, or hydronephrosis. The bladder appears thickened but is incompletely distended. Correlate clinically for cystitis.  Stomach/Bowel: No dilatation or wall thickening including the appendix. Mobile cecum.Uncomplicated sigmoid diverticulosis.   Lymphatic: No lymphadenopathy is seen.   Reproductive: Status post hysterectomy. No adnexal masses. Multiple pelvic phleboliths.   Other: No abdominal wall hernia or abnormality. No abdominopelvic ascites.   Musculoskeletal: There is mild osteopenia with degenerative changes of the thoracic and lumbar spine, including advanced disc collapse with bidirectional osteophytes at L5-S1   There is mild-to-moderate symmetric degenerative arthrosis of the medial femorotibial joints.   There is a 2.6 cm heterogeneous lesion in the right lateral femoral condyle, slightly expansile, with a partially calcified matrix and chondroid type features.   Fortunately there is a comparison right knee series from 02/11/2014 which showed this and it is unchanged in size.   All the left there is a subcentimeter chronic bone infarct in the proximal tibial metaphysis, seen on the previous left knee series from 05/10/2021.   There is a moderate suprapatellar bursal effusion on the left and mild synovial thickening compatible with synovitis. The density of the fluid is consistent with simple fluid, 12.6 Hounsfield units and there is incidentally noted a 1.4 x 1.5 x 3.6 cm popliteal cyst.   There is normal muscle bulk. No intramuscular collections are seen. There are mild edematous changes in the right ankle and foot and mild-to-moderate edematous changes in the left ankle and foot. No soft tissue gas or foreign bodies are observed.   Bilateral hallux  valgus which is mild on the left and moderate on the right.   There are bilateral noninflammatory plantar and dorsal calcaneal spurs. There is no destructive osseous or lytic lesion.   IMPRESSION: 1. Nonopacification of the left peroneal artery in the distal fourth of the foreleg, which could be occluded or could simply terminate in a muscular perforator. There is uninterrupted enhancement of the left anterior and posterior tibial arteries and two-vessel runoff into the foot, better visualized in the delayed arterial images. 2. Trace calcific plaques in the abdominal aortic bifurcation and common iliac arteries. No other significant vascular abnormality. 3. Slight cardiomegaly. 4. Mosaicism in the lung bases which could be due to low inspiration or small airways disease with air trapping. 5. Moderate left suprapatellar bursal effusion with mild synovitis. 6. 1.4 x 1.5 x 3.6 cm popliteal cyst. 7. Mildly prominent liver with mild steatosis. 8. Cystitis versus bladder nondistention. 9. Diverticulosis without evidence of diverticulitis. 10. Osteopenia and degenerative change. 11. 2.6 cm slightly expansile chondroid lesion in the right lateral femoral condyle, unchanged in size from 02/11/2014. 12. Bilateral hallux valgus with left-greater-than-right soft tissue edema in the ankles and feet. 13. Bilateral noninflammatory plantar and dorsal calcaneal spurs.  Statin:  No. Beta Blocker:  No. Aspirin:  No. ACEI:  Yes.   ARB:  No. CCB use:  No Other antiplatelets/anticoagulants:  No.    ASSESSMENT/PLAN: This is a 57 y.o. female who presents to [facility] emergency department for evaluation of worsening pain in her left leg.  Patient endorsed that it started days ago and the pain originates from behind her left knee.  On workup patient was found to have an ultrasound that revealed a Baker's cyst approximately 4 cm.  She is scheduled to follow-up with orthopedics sometime next month but she  started to have numbness and tingling and states her leg became very cold.  She denies any traumatic event to her left lower extremity.  Upon exam this morning the patient's lower extremities were warm to touch.  She has very strong bilateral Doppler  pulses both DP and PT.  At this time I do not believe her sharp shooting pains to her toes are originating from any vascular source.  This appears to be more of a neurologic source of pain which could be centered around her Baker's cyst.  Vascular surgery does not see a need for any intervention at this time.  Recommend neurologic workup as well as orthopedic workup related to Baker cysts that could be causing her left lower extremity pain.   -I discussed the plan in detail with Dr. Levora Dredge MD and he is in agreement with the plan.   Marcie Bal [facility] 01/20/2023 11:14 AM

## 2023-01-20 NOTE — ED Provider Notes (Signed)
[facility] Provider Note    Event Date/Time   First MD Initiated Contact with Patient 01/20/23 0448     (approximate)   History   Leg Pain   HPI Sherry Hernandez is a 57 y.o. female who presents for evaluation of worsening pain in her left leg.  He was seen a couple of days ago for pain and behind her left knee.  She was identified as having a Baker's cyst but no DVT on ultrasound.  She is supposed to follow-up with orthopedics, but tonight she said the pain became much more severe and she started to have numbness and tingling in her foot and her leg became very cold.  This has not happened in the past.  She has had no traumatic injury.  She is unaware of any history of arterial or peripheral vascular disease.     Physical Exam   Triage Vital Signs: ED Triage Vitals [01/20/23 0130]  Encounter Vitals Group     BP (!) 160/84     Systolic BP Percentile      Diastolic BP Percentile      Pulse Rate 77     Resp 18     Temp 98.5 F (36.9 C)     Temp Source Oral     SpO2 99 %     Weight 116.1 kg (256 lb)     Height 1.676 m (5\' 6" )     Head Circumference      Peak Flow      Pain Score 10     Pain Loc      Pain Education      Exclude from Growth Chart     Most recent vital signs: Vitals:   01/20/23 0130 01/20/23 0726  BP: (!) 160/84 (!) 117/41  Pulse: 77 68  Resp: 18 16  Temp: 98.5 F (36.9 C) 98.5 F (36.9 C)  SpO2: 99% 100%    General: Awake, no obvious distress. CV:  Regular rate and rhythm.  Patient has a palpable distal pulse in the dorsalis pedis of both feet, but her left lower extremity is slightly cyanotic with a substantially delayed capillary refill compared to the right.  It is more cool to the touch than the right. Resp:  Normal effort. Speaking easily and comfortably, no accessory muscle usage nor intercostal retractions.   Abd:  No distention.  Other:  Patient is reporting decreased sensation in the left lower extremity, more  profound distally, and reports difficulty wiggling her toes.   ED Results / Procedures / Treatments   Labs (all labs ordered are listed, but only abnormal results are displayed) Labs Reviewed - No data to display   RADIOLOGY CTA aortofemoral scan pending at time of transfer of care to Dr. Rosalia Hammers.   PROCEDURES:  Critical Care performed: No  Procedures    IMPRESSION / MDM / ASSESSMENT AND PLAN / ED COURSE  I reviewed the triage vital signs and the nursing notes.                              Differential diagnosis includes, but is not limited to, arterial occlusion or dissection, phlegmasia cerulea dolens, Baker's cyst, musculoskeletal strain.  Patient's presentation is most consistent with acute presentation with potential threat to life or bodily function.  Labs/studies ordered: CTA aorta bifemoral study.  Interventions/Medications given:  Medications  iohexol (OMNIPAQUE) 350 MG/ML injection 125 mL (125 mLs Intravenous  Contrast Given 01/20/23 0605)    (Note:  hospital course my include additional interventions and/or labs/studies not listed above.)   Labs performed less than 48 hours ago and creatinine was normal.  I have a relatively low suspicion of any acute ischemic limb or arterial occlusion given a palpable pulse in the dorsalis pedis, but I have no better reason to explain the new onset pain and worsening symptoms as documented above.  Given the threat of loss of limb, I will proceed without labs with a CTA to evaluate the arterial perfusion of the leg.  Patient understands and agrees.  Transferring ED care to Dr. Rosalia Hammers to follow-up on the imaging and treat/consult/dispo appropriately.       FINAL CLINICAL IMPRESSION(S) / ED DIAGNOSES   Final diagnoses:  Left leg pain  Left leg numbness     Rx / DC Orders   ED Discharge Orders     None        Note:  This document was prepared using Dragon voice recognition software and may include unintentional  dictation errors.   Loleta Rose, MD 01/20/23 [phone]

## 2023-01-20 NOTE — ED Provider Notes (Signed)
Care of this patient assumed from prior physician at 0700 pending CTA, reevaluation, and disposition. Please see prior physician note for further details.  Briefly, this is a 57 year old female with recently identified Baker's cyst presenting to the emergency department for worsening lower extremity pain and paresthesias.  Initially felt like her leg was cool to the touch but did have palpable pulses on exam.  Signed out to me pending CTA and disposition.   CT angiogram demonstrated nonopacification of the left peroneal artery and the distal fourth of the foreleg, per radiology possibly related to occlusion versus termination of the muscular perforator.  Multiple incidental findings noted.  Evaluation, patient's extremities were warm to the touch with intact DP and PT pulses.  She did have diminished sensation over the left lower extremity distal to the knee as well as weakness with ankle movement.  In the setting of this, vascular surgery was consulted.  They did not feel that the patient had an acute vascular issue.  They did suspect more likely peripheral nerve compression secondary to her Baker's cyst.  She does have follow-up with orthopedics scheduled.  Patient is comfortable with discharge home and outpatient follow-up.  She was able to ambulate with crutches.  I have also given her information for follow-up with neurology.  Strict return precautions were provided.  Patient was discharged in stable condition.   Trinna Post, MD 01/20/23 [phone]

## 2023-02-03 ENCOUNTER — Other Ambulatory Visit: Payer: Self-pay | Admitting: Student

## 2023-02-03 DIAGNOSIS — M23204 Derangement of unspecified medial meniscus due to old tear or injury, left knee: Secondary | ICD-10-CM

## 2023-02-03 DIAGNOSIS — M25462 Effusion, left knee: Secondary | ICD-10-CM

## 2023-02-03 DIAGNOSIS — M1712 Unilateral primary osteoarthritis, left knee: Secondary | ICD-10-CM

## 2023-02-06 ENCOUNTER — Ambulatory Visit: Payer: Self-pay

## 2023-02-10 ENCOUNTER — Encounter: Payer: Self-pay | Admitting: Urology

## 2023-02-10 ENCOUNTER — Ambulatory Visit: Payer: 59

## 2023-02-10 ENCOUNTER — Ambulatory Visit: Payer: 59 | Admitting: Urology

## 2023-02-10 VITALS — BP 159/78 | HR 72 | Ht 66.0 in | Wt 270.0 lb

## 2023-02-10 DIAGNOSIS — N3946 Mixed incontinence: Secondary | ICD-10-CM

## 2023-02-10 DIAGNOSIS — R310 Gross hematuria: Secondary | ICD-10-CM | POA: Diagnosis not present

## 2023-02-10 DIAGNOSIS — R351 Nocturia: Secondary | ICD-10-CM | POA: Diagnosis not present

## 2023-02-10 DIAGNOSIS — R350 Frequency of micturition: Secondary | ICD-10-CM | POA: Diagnosis not present

## 2023-02-10 LAB — URINALYSIS, COMPLETE
Bilirubin, UA: NEGATIVE
Glucose, UA: NEGATIVE
Ketones, UA: NEGATIVE
Nitrite, UA: NEGATIVE
Protein,UA: NEGATIVE
RBC, UA: NEGATIVE
Specific Gravity, UA: 1.025 (ref 1.005–1.030)
Urobilinogen, Ur: 0.2 mg/dL (ref 0.2–1.0)
pH, UA: 5.5 (ref 5.0–7.5)

## 2023-02-10 LAB — BLADDER SCAN AMB NON-IMAGING: Scan Result: 41

## 2023-02-10 LAB — MICROSCOPIC EXAMINATION: Epithelial Cells (non renal): >10 /hpf — AB (ref 0–10)

## 2023-02-10 MED ORDER — SOLIFENACIN SUCCINATE 5 MG PO TABS
5.0000 mg | ORAL_TABLET | Freq: Every day | ORAL | 11 refills | Status: DC
Start: 2023-02-10 — End: 2023-05-26

## 2023-02-10 NOTE — Progress Notes (Addendum)
02/10/2023 8:55 AM   Nadara Mode [birth year] [id number]  Referring provider: Linzie Collin, MD [address] [place],  [place] [address]  Chief Complaint  Patient presents with   New Patient (Initial Visit)   Urinary Incontinence    HPI: Dr. Marlou Porch thousand 16: Next incontinence, possible elevated residual, mild cystocele, previous urodynamics.  Failed Myrbetriq on oxybutynin.  Recommend clean intermittent catheterization  Today I was consulted to assess the patient's urinary incontinence.  She has urge incontinence as a primary complaint.  She also leaks with coughing sneezing bending lifting.  No bedwetting.  She says she soaks 8 or 9 pads a day  Voids at least hourly and cannot hold it for 2 hours.  Gets up at least 5 or 6 times a night and does have ankle edema.  Reports good flow  Some of the details were difficult to ascertain but she thinks she gets at least 4 bladder infections a year with burning and frequency that respond favorably to antibiotics.  She says she recently wiped and saw a little bit of blood.  She has had a hysterectomy  She is on oral hypoglycemics.  She uses a cane because of her swollen left knee and is going to have an MRI  No history of bladder surgery kidney stones.  Currently not on medication  Pelvic examination patient had mild grade 2 hypermobility bladder neck.  No stress incontinence with light cough.  No significant prolapse  Post void residual today was 41 mL   PMH: Past Medical History:  Diagnosis Date   Dysuria    Family history of adverse reaction to anesthesia    MOTHER PONV   Frequency    HTN (hypertension)    Obesity    Overactive bladder    Pre-diabetes    Sensory urge incontinence    Stress incontinence     Surgical History: Past Surgical History:  Procedure Laterality Date   ABDOMINAL HYSTERECTOMY     COLONOSCOPY WITH PROPOFOL N/A 03/24/2015   Procedure: COLONOSCOPY WITH PROPOFOL;  Surgeon: Elnita Maxwell, MD;  Location: [facility] ENDOSCOPY;  Service: Endoscopy;  Laterality: N/A;    Home Medications:  Allergies as of 02/10/2023   No Known Allergies      Medication List        Accurate as of February 10, 2023  8:55 AM. If you have any questions, ask your nurse or doctor.          acetaminophen 500 MG tablet Commonly known as: TYLENOL Take 1,000 mg by mouth every 6 (six) hours as needed for mild pain or headache.   cyanocobalamin 1000 MCG/ML injection Commonly known as: VITAMIN B12 Inject into the muscle.   Dialyvite Vitamin D 5000 125 MCG (5000 UT) capsule Generic drug: Cholecalciferol Take 5,000 Units by mouth daily.   dicyclomine 10 MG capsule Commonly known as: Bentyl Take 1 capsule (10 mg total) by mouth every 6 (six) hours as needed for up to 7 days for spasms (abdominal pain).   famotidine 20 MG tablet Commonly known as: PEPCID Take 1 tablet (20 mg total) by mouth 2 (two) times daily.   guaiFENesin-codeine 100-10 MG/5ML syrup Take 5 mLs by mouth every 4 (four) hours as needed.   ibuprofen 800 MG tablet Commonly known as: ADVIL Take by mouth.   lisinopril-hydrochlorothiazide 10-12.5 MG tablet Commonly known as: ZESTORETIC Take 1 tablet by mouth daily.   multivitamin-iron-minerals-folic acid chewable tablet Chew 2 tablets by mouth daily.   ondansetron  4 MG disintegrating tablet Commonly known as: Zofran ODT Take 1 tablet (4 mg total) by mouth every 8 (eight) hours as needed for nausea or vomiting.        Allergies: No Known Allergies  Family History: Family History  Problem Relation Age of Onset   Cancer Mother    Breast cancer Mother 83   Cancer Father    Cancer Sister    Breast cancer Sister        37    Social History:  reports that she quit smoking about 15 years ago. Her smoking use included cigarettes. She started smoking about 22 years ago. She has never used smokeless tobacco. She reports that she does not drink alcohol and does not use  drugs.  ROS:                                        Physical Exam: BP (!) 159/78   Pulse 72   Ht 5\' 6"  (1.676 m)   Wt 122.5 kg   BMI 43.58 kg/m   Constitutional:  Alert and oriented, No acute distress. HEENT: [place] AT, moist mucus membranes.  Trachea midline, no masses.  Laboratory Data: Lab Results  Component Value Date   WBC 5.2 01/18/2023   HGB 13.2 01/18/2023   HCT 39.8 01/18/2023   MCV 87.3 01/18/2023   PLT 187 01/18/2023    Lab Results  Component Value Date   CREATININE 0.88 01/18/2023    No results found for: "PSA"  No results found for: "TESTOSTERONE"  No results found for: "HGBA1C"  Urinalysis    Component Value Date/Time   COLORURINE YELLOW (A) 08/25/2020 0829   APPEARANCEUR HAZY (A) 08/25/2020 0829   APPEARANCEUR Clear 04/08/2013 2258   LABSPEC 1.020 08/25/2020 0829   LABSPEC 1.009 04/08/2013 2258   PHURINE 5.0 08/25/2020 0829   GLUCOSEU NEGATIVE 08/25/2020 0829   GLUCOSEU Negative 04/08/2013 2258   HGBUR NEGATIVE 08/25/2020 0829   BILIRUBINUR NEGATIVE 08/25/2020 0829   BILIRUBINUR Negative 04/08/2013 2258   KETONESUR NEGATIVE 08/25/2020 0829   PROTEINUR NEGATIVE 08/25/2020 0829   NITRITE NEGATIVE 08/25/2020 0829   LEUKOCYTESUR TRACE (A) 08/25/2020 0829   LEUKOCYTESUR Negative 04/08/2013 2258    Pertinent Imaging: Urine reviewed and sent for culture.  No blood in urine.  No recent renal x-rays and I saw no urine cultures in medical record  Assessment & Plan: Patient has high-volume mixed incontinence and significant frequency and nocturia.  She may be getting recurrent bladder infections.  She saw a very small amount of blood with wiping in the last few weeks.  Call if culture positive.  Return with hematuria CT scan and cystoscopy.  Called and Vesicare 5 mg 30 x 11 patient understands that we would need to repeat the urodynamics in the future depending on the results.  She agreed with proceeding with urodynamics if  needed  1. Mixed stress and urge urinary incontinence  - Urinalysis, Complete   No follow-ups on file.  Martina Sinner, MD  [facility] [address] [place], [place] [address] [phone]

## 2023-02-13 ENCOUNTER — Ambulatory Visit
Admission: RE | Admit: 2023-02-13 | Discharge: 2023-02-13 | Disposition: A | Discharge status: Home / Self Care | Payer: 59 | Source: Ambulatory Visit | Attending: Student | Admitting: Student

## 2023-02-13 ENCOUNTER — Other Ambulatory Visit: Payer: Self-pay | Admitting: Student

## 2023-02-13 ENCOUNTER — Ambulatory Visit: Payer: Self-pay

## 2023-02-13 ENCOUNTER — Encounter: Payer: Self-pay | Admitting: Student

## 2023-02-13 DIAGNOSIS — M25462 Effusion, left knee: Secondary | ICD-10-CM

## 2023-02-13 DIAGNOSIS — M1712 Unilateral primary osteoarthritis, left knee: Secondary | ICD-10-CM

## 2023-02-13 DIAGNOSIS — M23204 Derangement of unspecified medial meniscus due to old tear or injury, left knee: Secondary | ICD-10-CM | POA: Insufficient documentation

## 2023-02-13 DIAGNOSIS — S83242A Other tear of medial meniscus, current injury, left knee, initial encounter: Secondary | ICD-10-CM | POA: Diagnosis not present

## 2023-02-13 DIAGNOSIS — M659 Synovitis and tenosynovitis, unspecified: Secondary | ICD-10-CM | POA: Diagnosis not present

## 2023-03-06 ENCOUNTER — Other Ambulatory Visit: Payer: Self-pay | Admitting: Surgery

## 2023-03-12 ENCOUNTER — Other Ambulatory Visit: Payer: Self-pay

## 2023-03-12 ENCOUNTER — Encounter
Admission: RE | Admit: 2023-03-12 | Discharge: 2023-03-12 | Disposition: A | Discharge status: Home / Self Care | Payer: Medicaid Other | Source: Ambulatory Visit | Attending: Surgery | Admitting: Surgery

## 2023-03-12 VITALS — BP 140/73 | HR 68 | Temp 98.2°F | Resp 20 | Ht 66.0 in | Wt 278.6 lb

## 2023-03-12 DIAGNOSIS — Z01810 Encounter for preprocedural cardiovascular examination: Secondary | ICD-10-CM | POA: Diagnosis not present

## 2023-03-12 DIAGNOSIS — Z01818 Encounter for other preprocedural examination: Secondary | ICD-10-CM | POA: Insufficient documentation

## 2023-03-12 DIAGNOSIS — M1712 Unilateral primary osteoarthritis, left knee: Secondary | ICD-10-CM | POA: Insufficient documentation

## 2023-03-12 DIAGNOSIS — R7303 Prediabetes: Secondary | ICD-10-CM

## 2023-03-12 HISTORY — DX: Myoneural disorder, unspecified: G70.9

## 2023-03-12 HISTORY — DX: Unilateral primary osteoarthritis, left knee: M17.12

## 2023-03-12 HISTORY — DX: Derangement of unspecified medial meniscus due to old tear or injury, left knee: M23.204

## 2023-03-12 LAB — CBC WITH DIFFERENTIAL/PLATELET
Abs Immature Granulocytes: 0.03 10*3/uL (ref 0.00–0.07)
Basophils Absolute: 0.1 10*3/uL (ref 0.0–0.1)
Basophils Relative: 1 %
Eosinophils Absolute: 0.1 10*3/uL (ref 0.0–0.5)
Eosinophils Relative: 2 %
HCT: 39.7 % (ref 36.0–46.0)
Hemoglobin: 13.1 g/dL (ref 12.0–15.0)
Immature Granulocytes: 1 %
Lymphocytes Relative: 31 %
Lymphs Abs: 1.4 10*3/uL (ref 0.7–4.0)
MCH: 29 pg (ref 26.0–34.0)
MCHC: 33 g/dL (ref 30.0–36.0)
MCV: 88 fL (ref 80.0–100.0)
Monocytes Absolute: 0.5 10*3/uL (ref 0.1–1.0)
Monocytes Relative: 10 %
Neutro Abs: 2.5 10*3/uL (ref 1.7–7.7)
Neutrophils Relative %: 55 %
Platelets: 198 10*3/uL (ref 150–400)
RBC: 4.51 MIL/uL (ref 3.87–5.11)
RDW: 14.7 % (ref 11.5–15.5)
WBC: 4.5 10*3/uL (ref 4.0–10.5)
nRBC: 0 % (ref 0.0–0.2)

## 2023-03-12 LAB — COMPREHENSIVE METABOLIC PANEL
ALT: 38 U/L (ref 0–44)
AST: 31 U/L (ref 15–41)
Albumin: 3.6 g/dL (ref 3.5–5.0)
Alkaline Phosphatase: 63 U/L (ref 38–126)
Anion gap: 7 (ref 5–15)
BUN: 17 mg/dL (ref 6–20)
CO2: 25 mmol/L (ref 22–32)
Calcium: 9.3 mg/dL (ref 8.9–10.3)
Chloride: 107 mmol/L (ref 98–111)
Creatinine, Ser: 0.89 mg/dL (ref 0.44–1.00)
GFR, Estimated: >60 mL/min (ref >60)
Glucose, Bld: 106 mg/dL — ABNORMAL HIGH (ref 70–99)
Potassium: 4 mmol/L (ref 3.5–5.1)
Sodium: 139 mmol/L (ref 135–145)
Total Bilirubin: 0.4 mg/dL (ref 0.3–1.2)
Total Protein: 6.3 g/dL — ABNORMAL LOW (ref 6.5–8.1)

## 2023-03-12 LAB — URINALYSIS, ROUTINE W REFLEX MICROSCOPIC
Bilirubin Urine: NEGATIVE
Glucose, UA: NEGATIVE mg/dL
Hgb urine dipstick: NEGATIVE
Ketones, ur: NEGATIVE mg/dL
Leukocytes,Ua: NEGATIVE
Nitrite: NEGATIVE
Protein, ur: NEGATIVE mg/dL
Specific Gravity, Urine: 1.008 (ref 1.005–1.030)
pH: 7 (ref 5.0–8.0)

## 2023-03-12 LAB — TYPE AND SCREEN
ABO/RH(D): A POS
Antibody Screen: NEGATIVE

## 2023-03-12 LAB — SURGICAL PCR SCREEN
MRSA, PCR: NEGATIVE
Staphylococcus aureus: NEGATIVE

## 2023-03-12 NOTE — Patient Instructions (Addendum)
Your procedure is scheduled on: 03/18/2023 Tuesday  Report to the Registration Desk on the 1st floor of the [facility].  To find out your arrival time, please call [phone] between 1PM - 3PM on: 03/17/2023 Monday  If your arrival time is 6:00 am, do not arrive before that time as the [facility] entrance doors do not open until 6:00 am.  REMEMBER: Instructions that are not followed completely may result in serious medical risk, up to and including death; or upon the discretion of your surgeon and anesthesiologist your surgery may need to be rescheduled.  Do not eat food after midnight the night before surgery.  No gum chewing or hard candies.  You may however, drink CLEAR liquids up to 2 hours before you are scheduled to arrive for your surgery. Do not drink anything within 2 hours of your scheduled arrival time.  Clear liquids include: - water  - apple juice without pulp - gatorade (not RED colors) - black coffee or tea (Do NOT add milk or creamers to the coffee or tea) Do NOT drink anything that is not on this list.   In addition, your doctor has ordered for you to drink the provided:  Gatorade  Drinking this carbohydrate drink up to two hours before surgery helps to reduce insulin resistance and improve patient outcomes. Please complete drinking 2 hours before scheduled arrival time.  One week prior to surgery: Stop Anti-inflammatories (NSAIDS) such as Advil, Aleve, Ibuprofen, Motrin, Naproxen, Naprosyn and Aspirin based products such as Excedrin, Goody's Powder, BC Powder. Stop ANY OVER THE COUNTER supplements until after surgery. You may however, continue to take Tylenol if needed for pain up until the day of surgery.  Continue taking all prescribed medications  all the way to the night before surgery with the exception of the following:     Metformin- please hold two days before surgery. Last dose should be 03/15/2023     Follow recommendations from Cardiologist or PCP  regarding stopping blood thinners.  TAKE ONLY THESE MEDICATIONS THE MORNING OF SURGERY WITH A SIP OF WATER:  Vesicare   No Alcohol for 24 hours before or after surgery.  No Smoking including e-cigarettes for 24 hours before surgery.  No chewable tobacco products for at least 6 hours before surgery.  No nicotine patches on the day of surgery.  Do not use any "recreational" drugs for at least a week (preferably 2 weeks) before your surgery.  Please be advised that the combination of cocaine and anesthesia may have negative outcomes, up to and including death. If you test positive for cocaine, your surgery will be cancelled.  On the morning of surgery brush your teeth with toothpaste and water, you may rinse your mouth with mouthwash if you wish. Do not swallow any toothpaste or mouthwash.  Use CHG Soap or wipes as directed on instruction sheet.-  provided for you   Do not wear jewelry, make-up, hairpins, clips or nail polish.  Do not wear lotions, powders, or perfumes.   Do not shave body hair from the neck down 48 hours before surgery.  Contact lenses, hearing aids and dentures may not be worn into surgery.  Do not bring valuables to the hospital. [facility] is not responsible for any missing/lost belongings or valuables.      Notify your doctor if there is any change in your medical condition (cold, fever, infection).  Wear comfortable clothing (specific to your surgery type) to the hospital.  After surgery, you can  help prevent lung complications by doing breathing exercises.  Take deep breaths and cough every 1-2 hours. Your doctor may order a device called an Incentive Spirometer to help you take deep breaths. If you are being admitted to the hospital overnight, leave your suitcase in the car. After surgery it may be brought to your room.    If you are being discharged the day of surgery, you will not be allowed to drive home. You will need a responsible individual  to drive you home and stay with you for 24 hours after surgery.     Please call the Pre-admissions Testing Dept. at [phone] if you have any questions about these instructions.  Surgery Visitation Policy:  Patients having surgery or a procedure may have two visitors.  Children under the age of 42 must have an adult with them who is not the patient.     Pre-operative 5 CHG Bath Instructions   You can play a key role in reducing the risk of infection after surgery. Your skin needs to be as free of germs as possible. You can reduce the number of germs on your skin by washing with CHG (chlorhexidine gluconate) soap before surgery. CHG is an antiseptic soap that kills germs and continues to kill germs even after washing.   DO NOT use if you have an allergy to chlorhexidine/CHG or antibacterial soaps. If your skin becomes reddened or irritated, stop using the CHG and notify one of our RNs at [phone].   Please shower with the CHG soap starting 4 days before surgery using the following schedule:                                                                                                                                                   Please keep in mind the following:  DO NOT shave, including legs and underarms, starting the day of your first shower.   You may shave your face at any point before/day of surgery.  Place clean sheets on your bed the day you start using CHG soap. Use a clean washcloth (not used since being washed) for each shower. DO NOT sleep with pets once you start using the CHG.   CHG Shower Instructions:  If you choose to wash your hair and private area, wash first with your normal shampoo/soap.  After you use shampoo/soap, rinse your hair and body thoroughly to remove shampoo/soap residue.  Turn the water OFF and apply about 3 tablespoons (45 ml) of CHG soap to a CLEAN washcloth.  Apply CHG soap ONLY FROM YOUR NECK DOWN TO YOUR TOES (washing for  3-5 minutes)  DO NOT use CHG soap on face, private areas, open wounds, or sores.  Pay special attention to the area where your surgery is being performed.  If you are having back surgery, having someone wash  your back for you may be helpful. Wait 2 minutes after CHG soap is applied, then you may rinse off the CHG soap.  Pat dry with a clean towel  Put on clean clothes/pajamas   If you choose to wear lotion, please use ONLY the CHG-compatible lotions on the back of this paper.     Additional instructions for the day of surgery: DO NOT APPLY any lotions, deodorants, cologne, or perfumes.   Put on clean/comfortable clothes.  Brush your teeth.  Ask your nurse before applying any prescription medications to the skin.      CHG Compatible Lotions   Aveeno Moisturizing lotion  Cetaphil Moisturizing Cream  Cetaphil Moisturizing Lotion  Clairol Herbal Essence Moisturizing Lotion, Dry Skin  Clairol Herbal Essence Moisturizing Lotion, Extra Dry Skin  Clairol Herbal Essence Moisturizing Lotion, Normal Skin  Curel Age Defying Therapeutic Moisturizing Lotion with Alpha Hydroxy  Curel Extreme Care Body Lotion  Curel Soothing Hands Moisturizing Hand Lotion  Curel Therapeutic Moisturizing Cream, Fragrance-Free  Curel Therapeutic Moisturizing Lotion, Fragrance-Free  Curel Therapeutic Moisturizing Lotion, Original Formula  Eucerin Daily Replenishing Lotion  Eucerin Dry Skin Therapy Plus Alpha Hydroxy Crme  Eucerin Dry Skin Therapy Plus Alpha Hydroxy Lotion  Eucerin Original Crme  Eucerin Original Lotion  Eucerin Plus Crme Eucerin Plus Lotion  Eucerin TriLipid Replenishing Lotion  Keri Anti-Bacterial Hand Lotion  Keri Deep Conditioning Original Lotion Dry Skin Formula Softly Scented  Keri Deep Conditioning Original Lotion, Fragrance Free Sensitive Skin Formula  Keri Lotion Fast Absorbing Fragrance Free Sensitive Skin Formula  Keri Lotion Fast Absorbing Softly Scented Dry Skin Formula  Keri  Original Lotion  Keri Skin Renewal Lotion Keri Silky Smooth Lotion  Keri Silky Smooth Sensitive Skin Lotion  Nivea Body Creamy Conditioning Oil  Nivea Body Extra Enriched Lotion  Nivea Body Original Lotion  Nivea Body Sheer Moisturizing Lotion Nivea Crme  Nivea Skin Firming Lotion  NutraDerm 30 Skin Lotion  NutraDerm Skin Lotion  NutraDerm Therapeutic Skin Cream  NutraDerm Therapeutic Skin Lotion  ProShield Protective Hand Cream  Provon moisturizing lotion     Preoperative Educational Videos for Total Hip, Knee and Shoulder Replacements  To better prepare for surgery, please view our videos that explain the physical activity and discharge planning required to have the best surgical recovery at [facility].  [email or web address]  Questions? Call [phone] or email [email or web address]       How to Use an Incentive Spirometer  An incentive spirometer is a tool that measures how well you are filling your lungs with each breath. Learning to take long, deep breaths using this tool can help you keep your lungs clear and active. This may help to reverse or lessen your chance of developing breathing (pulmonary) problems, especially infection. You may be asked to use a spirometer: After a surgery. If you have a lung problem or a history of smoking. After a long period of time when you have been unable to move or be active. If the spirometer includes an indicator to show the highest number that you have reached, your health care provider or respiratory therapist will help you set a goal. Keep a log of your progress as told by your health care provider. What are the risks? Breathing too quickly may cause dizziness or cause you to pass out. Take your time so you do not get dizzy or light-headed. If you are in pain, you may need to take pain medicine before doing incentive spirometry. It is harder to take a  deep breath if you are having  pain. How to use your incentive spirometer  Sit up on the edge of your bed or on a chair. Hold the incentive spirometer so that it is in an upright position. Before you use the spirometer, breathe out normally. Place the mouthpiece in your mouth. Make sure your lips are closed tightly around it. Breathe in slowly and as deeply as you can through your mouth, causing the piston or the ball to rise toward the top of the chamber. Hold your breath for 3-5 seconds, or for as long as possible. If the spirometer includes a coach indicator, use this to guide you in breathing. Slow down your breathing if the indicator goes above the marked areas. Remove the mouthpiece from your mouth and breathe out normally. The piston or ball will return to the bottom of the chamber. Rest for a few seconds, then repeat the steps 10 or more times. Take your time and take a few normal breaths between deep breaths so that you do not get dizzy or light-headed. Do this every 1-2 hours when you are awake. If the spirometer includes a goal marker to show the highest number you have reached (best effort), use this as a goal to work toward during each repetition. After each set of 10 deep breaths, cough a few times. This will help to make sure that your lungs are clear. If you have an incision on your chest or abdomen from surgery, place a pillow or a rolled-up towel firmly against the incision when you cough. This can help to reduce pain while taking deep breaths and coughing. General tips When you are able to get out of bed: Walk around often. Continue to take deep breaths and cough in order to clear your lungs. Keep using the incentive spirometer until your health care provider says it is okay to stop using it. If you have been in the hospital, you may be told to keep using the spirometer at home. Contact a health care provider if: You are having difficulty using the spirometer. You have trouble using the spirometer as  often as instructed. Your pain medicine is not giving enough relief for you to use the spirometer as told. You have a fever. Get help right away if: You develop shortness of breath. You develop a cough with bloody mucus from the lungs. You have fluid or blood coming from an incision site after you cough. Summary An incentive spirometer is a tool that can help you learn to take long, deep breaths to keep your lungs clear and active. You may be asked to use a spirometer after a surgery, if you have a lung problem or a history of smoking, or if you have been inactive for a long period of time. Use your incentive spirometer as instructed every 1-2 hours while you are awake. If you have an incision on your chest or abdomen, place a pillow or a rolled-up towel firmly against your incision when you cough. This will help to reduce pain. Get help right away if you have shortness of breath, you cough up bloody mucus, or blood comes from your incision when you cough. This information is not intended to replace advice given to you by your health care provider. Make sure you discuss any questions you have with your health care provider. Document Revised: 09/13/2019 Document Reviewed: 09/13/2019 Elsevier Patient Education  2023 [employer].

## 2023-03-17 MED ORDER — FAMOTIDINE 20 MG PO TABS
20.0000 mg | ORAL_TABLET | Freq: Once | ORAL | Status: AC
  Administered 2023-03-18: 20 mg via ORAL

## 2023-03-17 MED ORDER — ORAL CARE MOUTH RINSE: 15.0000 mL | Freq: Once | OROMUCOSAL | Status: AC

## 2023-03-17 MED ORDER — SODIUM CHLORIDE 0.9 % IV SOLN: INTRAVENOUS | Status: DC

## 2023-03-17 MED ORDER — CHLORHEXIDINE GLUCONATE 0.12 % MT SOLN
15.0000 mL | Freq: Once | OROMUCOSAL | Status: AC
  Administered 2023-03-18: 15 mL via OROMUCOSAL

## 2023-03-17 MED ORDER — CEFAZOLIN IN SODIUM CHLORIDE 3-0.9 GM/100ML-% IV SOLN
3.0000 g | INTRAVENOUS | Status: AC
  Administered 2023-03-18: 3 g via INTRAVENOUS
  Filled 2023-03-17: qty 100

## 2023-03-18 ENCOUNTER — Encounter
Admission: RE | Disposition: A | Discharge status: Home / Self Care | Payer: Self-pay | Source: Home / Self Care | Attending: Surgery

## 2023-03-18 ENCOUNTER — Encounter: Payer: Self-pay | Admitting: Surgery

## 2023-03-18 ENCOUNTER — Ambulatory Visit: Payer: Medicaid Other | Admitting: Certified Registered"

## 2023-03-18 ENCOUNTER — Other Ambulatory Visit: Payer: Self-pay

## 2023-03-18 ENCOUNTER — Ambulatory Visit
Admission: RE | Admit: 2023-03-18 | Discharge: 2023-03-18 | Disposition: A | Discharge status: Home / Self Care | Payer: Medicaid Other | Attending: Surgery | Admitting: Surgery

## 2023-03-18 ENCOUNTER — Ambulatory Visit: Payer: Medicaid Other | Admitting: Urgent Care

## 2023-03-18 ENCOUNTER — Ambulatory Visit: Payer: Medicaid Other

## 2023-03-18 DIAGNOSIS — X58XXXA Exposure to other specified factors, initial encounter: Secondary | ICD-10-CM | POA: Diagnosis not present

## 2023-03-18 DIAGNOSIS — Z791 Long term (current) use of non-steroidal anti-inflammatories (NSAID): Secondary | ICD-10-CM | POA: Insufficient documentation

## 2023-03-18 DIAGNOSIS — M1712 Unilateral primary osteoarthritis, left knee: Secondary | ICD-10-CM | POA: Insufficient documentation

## 2023-03-18 DIAGNOSIS — S83242A Other tear of medial meniscus, current injury, left knee, initial encounter: Secondary | ICD-10-CM | POA: Diagnosis not present

## 2023-03-18 DIAGNOSIS — R7303 Prediabetes: Secondary | ICD-10-CM

## 2023-03-18 HISTORY — PX: TOTAL KNEE ARTHROPLASTY: SHX125

## 2023-03-18 LAB — GLUCOSE, CAPILLARY: Glucose-Capillary: 116 mg/dL — ABNORMAL HIGH (ref 70–99)

## 2023-03-18 SURGERY — ARTHROPLASTY, KNEE, TOTAL
Anesthesia: Spinal | Site: Knee | Laterality: Left

## 2023-03-18 MED ORDER — SODIUM CHLORIDE 0.9 % BOLUS PEDS
250.0000 mL | Freq: Once | INTRAVENOUS | Status: AC
  Administered 2023-03-18: 250 mL via INTRAVENOUS

## 2023-03-18 MED ORDER — PHENYLEPHRINE HCL (PRESSORS) 10 MG/ML IV SOLN
INTRAVENOUS | Status: DC | PRN
Start: 2023-03-18 — End: 2023-03-18
  Administered 2023-03-18 (×2): 80 ug via INTRAVENOUS

## 2023-03-18 MED ORDER — ONDANSETRON HCL 4 MG/2ML IJ SOLN
INTRAMUSCULAR | Status: DC | PRN
  Administered 2023-03-18: 4 mg via INTRAVENOUS

## 2023-03-18 MED ORDER — CEFAZOLIN IN SODIUM CHLORIDE 3-0.9 GM/100ML-% IV SOLN
3.0000 g | Freq: Three times a day (TID) | INTRAVENOUS | Status: DC
  Filled 2023-03-18 (×2): qty 100

## 2023-03-18 MED ORDER — METOCLOPRAMIDE HCL 10 MG PO TABS: 5.0000 mg | ORAL_TABLET | Freq: Three times a day (TID) | ORAL | Status: DC | PRN

## 2023-03-18 MED ORDER — ONDANSETRON HCL 4 MG/2ML IJ SOLN
4.0000 mg | Freq: Four times a day (QID) | INTRAMUSCULAR | Status: DC | PRN
  Administered 2023-03-18: 4 mg via INTRAVENOUS

## 2023-03-18 MED ORDER — TRIAMCINOLONE ACETONIDE 40 MG/ML IJ SUSP
INTRAMUSCULAR | Status: AC
  Filled 2023-03-18: qty 1

## 2023-03-18 MED ORDER — MIDAZOLAM HCL 2 MG/2ML IJ SOLN
INTRAMUSCULAR | Status: AC
  Filled 2023-03-18: qty 2

## 2023-03-18 MED ORDER — OXYCODONE HCL 5 MG/5ML PO SOLN: 5.0000 mg | Freq: Once | ORAL | Status: AC | PRN

## 2023-03-18 MED ORDER — MIDAZOLAM HCL 5 MG/5ML IJ SOLN
INTRAMUSCULAR | Status: DC | PRN
  Administered 2023-03-18: 2 mg via INTRAVENOUS

## 2023-03-18 MED ORDER — ONDANSETRON HCL 4 MG PO TABS: 4.0000 mg | ORAL_TABLET | Freq: Four times a day (QID) | ORAL | Status: DC | PRN

## 2023-03-18 MED ORDER — FENTANYL CITRATE (PF) 100 MCG/2ML IJ SOLN
INTRAMUSCULAR | Status: AC
  Filled 2023-03-18: qty 2

## 2023-03-18 MED ORDER — KETOROLAC TROMETHAMINE 30 MG/ML IJ SOLN
30.0000 mg | Freq: Once | INTRAMUSCULAR | Status: AC
  Administered 2023-03-18: 30 mg via INTRAVENOUS

## 2023-03-18 MED ORDER — OXYCODONE HCL 5 MG PO TABS: 5.0000 mg | ORAL_TABLET | ORAL | Status: DC | PRN

## 2023-03-18 MED ORDER — CHLORHEXIDINE GLUCONATE 0.12 % MT SOLN
OROMUCOSAL | Status: AC
  Filled 2023-03-18: qty 15

## 2023-03-18 MED ORDER — OXYCODONE HCL 5 MG PO TABS
5.0000 mg | ORAL_TABLET | ORAL | 0 refills | Status: DC | PRN
Start: 2023-03-18 — End: 2023-12-29

## 2023-03-18 MED ORDER — TRANEXAMIC ACID 1000 MG/10ML IV SOLN
INTRAVENOUS | Status: DC | PRN
  Administered 2023-03-18: 1000 mg via INTRAVENOUS

## 2023-03-18 MED ORDER — SODIUM CHLORIDE 0.9 % IR SOLN
Status: DC | PRN
  Administered 2023-03-18: 3000 mL

## 2023-03-18 MED ORDER — ONDANSETRON HCL 4 MG/2ML IJ SOLN
INTRAMUSCULAR | Status: AC
  Filled 2023-03-18: qty 2

## 2023-03-18 MED ORDER — DROPERIDOL 2.5 MG/ML IJ SOLN
INTRAMUSCULAR | Status: AC
  Filled 2023-03-18: qty 2

## 2023-03-18 MED ORDER — OXYCODONE HCL 5 MG PO TABS
ORAL_TABLET | ORAL | Status: AC
  Filled 2023-03-18: qty 1

## 2023-03-18 MED ORDER — TRANEXAMIC ACID-NACL 1000-0.7 MG/100ML-% IV SOLN
INTRAVENOUS | Status: AC
  Filled 2023-03-18: qty 100

## 2023-03-18 MED ORDER — PROPOFOL 1000 MG/100ML IV EMUL
INTRAVENOUS | Status: AC
  Filled 2023-03-18: qty 300

## 2023-03-18 MED ORDER — PROMETHAZINE HCL 25 MG/ML IJ SOLN: 6.2500 mg | INTRAMUSCULAR | Status: DC | PRN

## 2023-03-18 MED ORDER — BUPIVACAINE HCL (PF) 0.5 % IJ SOLN
INTRAMUSCULAR | Status: AC
  Filled 2023-03-18: qty 30

## 2023-03-18 MED ORDER — ACETAMINOPHEN 10 MG/ML IV SOLN
INTRAVENOUS | Status: DC | PRN
  Administered 2023-03-18: 1000 mg via INTRAVENOUS

## 2023-03-18 MED ORDER — KETAMINE HCL 10 MG/ML IJ SOLN
INTRAMUSCULAR | Status: DC | PRN
Start: 2023-03-18 — End: 2023-03-18
  Administered 2023-03-18: 20 mg via INTRAVENOUS

## 2023-03-18 MED ORDER — PHENYLEPHRINE HCL-NACL 20-0.9 MG/250ML-% IV SOLN
INTRAVENOUS | Status: AC
  Filled 2023-03-18: qty 250

## 2023-03-18 MED ORDER — DROPERIDOL 2.5 MG/ML IJ SOLN
0.6250 mg | Freq: Once | INTRAMUSCULAR | Status: AC | PRN
  Administered 2023-03-18: 0.625 mg via INTRAVENOUS

## 2023-03-18 MED ORDER — ACETAMINOPHEN 325 MG PO TABS: 325.0000 mg | ORAL_TABLET | Freq: Four times a day (QID) | ORAL | Status: DC | PRN

## 2023-03-18 MED ORDER — SODIUM CHLORIDE FLUSH 0.9 % IV SOLN
INTRAVENOUS | Status: AC
  Filled 2023-03-18: qty 40

## 2023-03-18 MED ORDER — OXYCODONE HCL 5 MG PO TABS
5.0000 mg | ORAL_TABLET | Freq: Once | ORAL | Status: AC | PRN
  Administered 2023-03-18: 5 mg via ORAL

## 2023-03-18 MED ORDER — KETOROLAC TROMETHAMINE 30 MG/ML IJ SOLN
INTRAMUSCULAR | Status: AC
  Filled 2023-03-18: qty 1

## 2023-03-18 MED ORDER — ACETAMINOPHEN 10 MG/ML IV SOLN
INTRAVENOUS | Status: AC
  Filled 2023-03-18: qty 100

## 2023-03-18 MED ORDER — BUPIVACAINE LIPOSOME 1.3 % IJ SUSP
INTRAMUSCULAR | Status: AC
  Filled 2023-03-18: qty 20

## 2023-03-18 MED ORDER — ACETAMINOPHEN 10 MG/ML IV SOLN: 1000.0000 mg | Freq: Once | INTRAVENOUS | Status: DC | PRN

## 2023-03-18 MED ORDER — KETAMINE HCL 50 MG/5ML IJ SOSY
PREFILLED_SYRINGE | INTRAMUSCULAR | Status: AC
  Filled 2023-03-18: qty 5

## 2023-03-18 MED ORDER — HYDROMORPHONE HCL 1 MG/ML IJ SOLN
INTRAMUSCULAR | Status: AC
  Filled 2023-03-18: qty 1

## 2023-03-18 MED ORDER — FAMOTIDINE 20 MG PO TABS
ORAL_TABLET | ORAL | Status: AC
  Filled 2023-03-18: qty 1

## 2023-03-18 MED ORDER — PROPOFOL 10 MG/ML IV BOLUS
INTRAVENOUS | Status: AC
  Filled 2023-03-18: qty 40

## 2023-03-18 MED ORDER — PROPOFOL 500 MG/50ML IV EMUL
INTRAVENOUS | Status: DC | PRN
  Administered 2023-03-18: 100 ug/kg/min via INTRAVENOUS

## 2023-03-18 MED ORDER — METOCLOPRAMIDE HCL 5 MG/ML IJ SOLN: 5.0000 mg | Freq: Three times a day (TID) | INTRAMUSCULAR | Status: DC | PRN

## 2023-03-18 MED ORDER — SODIUM CHLORIDE 0.9 % IV SOLN: INTRAVENOUS | Status: DC

## 2023-03-18 MED ORDER — FENTANYL CITRATE (PF) 100 MCG/2ML IJ SOLN
25.0000 ug | INTRAMUSCULAR | Status: DC | PRN
  Administered 2023-03-18 (×4): 25 ug via INTRAVENOUS

## 2023-03-18 MED ORDER — SODIUM CHLORIDE 0.9 % IV SOLN
INTRAVENOUS | Status: DC | PRN
Start: 2023-03-18 — End: 2023-03-18

## 2023-03-18 MED ORDER — TRIAMCINOLONE ACETONIDE 40 MG/ML IJ SUSP
INTRAMUSCULAR | Status: DC | PRN
  Administered 2023-03-18: 92.5 mL

## 2023-03-18 MED ORDER — HYDROMORPHONE HCL 1 MG/ML IJ SOLN
INTRAMUSCULAR | Status: DC | PRN
  Administered 2023-03-18: .3 mg via INTRAVENOUS
  Administered 2023-03-18: .2 mg via INTRAVENOUS

## 2023-03-18 MED ORDER — 0.9 % SODIUM CHLORIDE (POUR BTL) OPTIME
TOPICAL | Status: DC | PRN
  Administered 2023-03-18: 500 mL

## 2023-03-18 MED ORDER — APIXABAN 2.5 MG PO TABS: 2.5000 mg | ORAL_TABLET | Freq: Two times a day (BID) | ORAL | 0 refills | Status: DC

## 2023-03-18 MED ORDER — EPINEPHRINE PF 1 MG/ML IJ SOLN
INTRAMUSCULAR | Status: AC
  Filled 2023-03-18: qty 1

## 2023-03-18 MED ORDER — BUPIVACAINE HCL (PF) 0.5 % IJ SOLN
INTRAMUSCULAR | Status: DC | PRN
  Administered 2023-03-18: 2.8 mL

## 2023-03-18 MED ORDER — STERILE WATER FOR IRRIGATION IR SOLN
Status: DC | PRN
  Administered 2023-03-18: 1000 mL

## 2023-03-18 SURGICAL SUPPLY — 64 items
APL PRP STRL LF DISP 70% ISPRP (MISCELLANEOUS) ×1
BLADE SAW 90X13X1.19 OSCILLAT (BLADE) ×1 IMPLANT
BLADE SAW SAG 25X90X1.19 (BLADE) ×1 IMPLANT
BLADE SURG SZ20 CARB STEEL (BLADE) ×1 IMPLANT
BNDG CMPR STD VLCR NS LF 5.8X6 (GAUZE/BANDAGES/DRESSINGS) ×1
BNDG ELASTIC 6X5.8 VLCR NS LF (GAUZE/BANDAGES/DRESSINGS) ×1 IMPLANT
CEMENT VACUUM MIXING SYSTEM (MISCELLANEOUS) ×1 IMPLANT
CHLORAPREP W/TINT 26 (MISCELLANEOUS) ×1 IMPLANT
COMP FEM KNEE STD PS 7 LT (Joint) ×1 IMPLANT
COMP PATELLA 3 PEG 35 (Joint) ×1 IMPLANT
COMPONENT FEM KNEE STD PS 7 LT (Joint) IMPLANT
COMPONENT PATELLA 3 PEG 35 (Joint) IMPLANT
COOLER POLAR GLACIER W/PUMP (MISCELLANEOUS) ×1 IMPLANT
COVER MAYO STAND STRL (DRAPES) ×1 IMPLANT
CUFF TOURN SGL QUICK 24 (TOURNIQUET CUFF)
CUFF TOURN SGL QUICK 34 (TOURNIQUET CUFF) ×1
CUFF TRNQT CYL 24X4X16.5-23 (TOURNIQUET CUFF) IMPLANT
CUFF TRNQT CYL 34X4.125X (TOURNIQUET CUFF) IMPLANT
DRAPE IMP U-DRAPE 54X76 (DRAPES) ×1 IMPLANT
DRAPE SHEET LG 3/4 BI-LAMINATE (DRAPES) ×1 IMPLANT
DRAPE U-SHAPE 47X51 STRL (DRAPES) ×1 IMPLANT
DRSG MEPILEX SACRM 8.7X9.8 (GAUZE/BANDAGES/DRESSINGS) IMPLANT
DRSG OPSITE POSTOP 4X10 (GAUZE/BANDAGES/DRESSINGS) ×1 IMPLANT
DRSG OPSITE POSTOP 4X8 (GAUZE/BANDAGES/DRESSINGS) ×1 IMPLANT
ELECT CAUTERY BLADE 6.4 (BLADE) ×1 IMPLANT
ELECT REM PT RETURN 9FT ADLT (ELECTROSURGICAL) ×1
ELECTRODE REM PT RTRN 9FT ADLT (ELECTROSURGICAL) ×1 IMPLANT
GAUZE XEROFORM 1X8 LF (GAUZE/BANDAGES/DRESSINGS) ×1 IMPLANT
GLOVE BIO SURGEON STRL SZ7.5 (GLOVE) ×4 IMPLANT
GLOVE BIO SURGEON STRL SZ8 (GLOVE) ×4 IMPLANT
GLOVE BIOGEL PI IND STRL 8 (GLOVE) ×1 IMPLANT
GLOVE INDICATOR 8.0 STRL GRN (GLOVE) ×1 IMPLANT
GOWN STRL REUS W/ TWL LRG LVL3 (GOWN DISPOSABLE) ×1 IMPLANT
GOWN STRL REUS W/ TWL XL LVL3 (GOWN DISPOSABLE) ×1 IMPLANT
GOWN STRL REUS W/TWL LRG LVL3 (GOWN DISPOSABLE) ×1
GOWN STRL REUS W/TWL XL LVL3 (GOWN DISPOSABLE) ×1
HANDLE YANKAUER SUCT OPEN TIP (MISCELLANEOUS) ×1 IMPLANT
HOOD PEEL AWAY T7 (MISCELLANEOUS) ×3 IMPLANT
IV NS IRRIG 3000ML ARTHROMATIC (IV SOLUTION) ×1 IMPLANT
KIT TURNOVER KIT A (KITS) ×1 IMPLANT
MANIFOLD NEPTUNE II (INSTRUMENTS) ×1 IMPLANT
NDL SPNL 20GX3.5 QUINCKE YW (NEEDLE) ×1 IMPLANT
NEEDLE SPNL 20GX3.5 QUINCKE YW (NEEDLE) ×1 IMPLANT
NS IRRIG 1000ML POUR BTL (IV SOLUTION) ×1 IMPLANT
PACK TOTAL KNEE (MISCELLANEOUS) ×1 IMPLANT
PAD WRAPON POLAR KNEE (MISCELLANEOUS) ×1 IMPLANT
PENCIL SMOKE EVACUATOR (MISCELLANEOUS) ×1 IMPLANT
PIN DRILL HDLS TROCAR 75 4PK (PIN) IMPLANT
PULSAVAC PLUS IRRIG FAN TIP (DISPOSABLE) ×1
SCREW FEMALE HEX FIX 25X2.5 (ORTHOPEDIC DISPOSABLE SUPPLIES) IMPLANT
STAPLER SKIN PROX 35W (STAPLE) ×1 IMPLANT
STEM TIB PERS SZ E 5D LT (Screw) IMPLANT
STEM TIBIAL SZ6-7 E-F10 (Stem) IMPLANT
SUCTION TUBE FRAZIER 10FR DISP (SUCTIONS) ×1 IMPLANT
SUT VIC AB 0 CT1 36 (SUTURE) ×3 IMPLANT
SUT VIC AB 2-0 CT1 27 (SUTURE) ×3
SUT VIC AB 2-0 CT1 TAPERPNT 27 (SUTURE) ×3 IMPLANT
SYR 10ML LL (SYRINGE) ×1 IMPLANT
SYR 20ML LL LF (SYRINGE) ×1 IMPLANT
SYR 30ML LL (SYRINGE) IMPLANT
TIP FAN IRRIG PULSAVAC PLUS (DISPOSABLE) ×1 IMPLANT
TRAP FLUID SMOKE EVACUATOR (MISCELLANEOUS) ×2 IMPLANT
WATER STERILE IRR 500ML POUR (IV SOLUTION) ×1 IMPLANT
WRAPON POLAR PAD KNEE (MISCELLANEOUS) ×1

## 2023-03-18 NOTE — H&P (Signed)
History of Present Illness: Sherry Hernandez is a 57 y.o. female who presents today for repeat evaluation of ongoing left knee pain and swelling and discussion of recent left knee MRI scan. The patient has been evaluated the past and was diagnosed with left knee osteoarthritic changes in addition to a underlying left medial meniscus tear. In the past the patient has undergone left knee steroid injections in addition to aspiration procedures, she last underwent this procedure in May of this year which did provide moderate relief of her discomfort. The patient had a incident in which she experienced increased pain and swelling left knee which prompted a visit to emergency room, she did complain of some new onset burning and tingling left foot which prompted vascular evaluation as well. The patient underwent updated x-rays in addition to a CT angiogram of the left lower extremity which is detailed in the imaging section below. The patient was reevaluated where we did discuss the potential for viscosupplementation injections however due to her significant increase in pain despite undergoing previous left knee steroid injections it was decided to proceed with a updated MRI scan for further evaluation. X-rays obtained in clinic did not reveal significant degenerative changes. The patient presents today reporting continued aching and throbbing pain along the medial lateral aspect of left knee. She denies any radiation into the anterior thigh. She denies any radiation into the left foot or ankle. She denies any pain across the low back. Pain is primarily along the medial aspect of the left knee but she does report moderate lateral knee pain as well. She states that she has increased pain with prolonged periods of walking and standing in addition when attempting to get up from a seated position. The patient is taking over-the-counter medications as needed for discomfort at this time. She is taking ibuprofen routinely. The  patient denies any personal history of heart attack, stroke, asthma or COPD. She denies any history of blood clots. She denies any history of diabetes.  Past Medical History: HTN (hypertension)  Hypertension  Internal hemorrhoids 03/24/2015  Obesity  Overactive bladder   Past Surgical History: COLONOSCOPY 03/24/2015 (Int. Hem/Otherwise normal)  BSO  HYSTERECTOMY  TVH   Past Family History: Breast cancer Mother  Liver cancer Father  Coronary Artery Disease (Blocked arteries around heart) Father  Thyroid cancer Sister  No Known Problems Daughter  No Known Problems Son  Breast cancer Sister  Lung cancer Sister  No Known Problems Daughter  Diabetes type II Neg Hx  Osteoporosis (Thinning of bones) Neg Hx   Medications: Compound Medication Lipo den 1 mL given IM in office every month  ibuprofen (MOTRIN) 800 MG tablet Take 1 tablet (800 mg total) by mouth every 8 (eight) hours as needed for Pain Take medication with food. 60 tablet 1  lisinopriL-hydroCHLOROthiazide (ZESTORETIC) 10-12.5 mg tablet Take 1 tablet by mouth once daily 90 tablet 3  metFORMIN (GLUCOPHAGE) 500 MG tablet Take 1 tablet nightly x 10 days then increase to 2 tablets (1,000) 60 tablet 11  multivitamin tablet Take 1 tablet by mouth once daily  phendimetrazine tartrate 35 mg Take 35 mg by mouth 3 (three) times daily before meals 90 tablet 0  phendimetrazine tartrate 35 mg Take 35 mg by mouth 3 (three) times daily before meals 90 tablet 0  solifenacin (VESICARE) 5 MG tablet Take 5 mg by mouth once daily  metFORMIN (GLUCOPHAGE) 500 MG tablet Take 2 tablets (1,000 mg total) by mouth at bedtime (Patient not taking: Reported on 05/10/2022) 60  tablet 11   Allergies: Prevnar 20 (Pf) [Pneumoc 20-Val Conj-Dip Cr(Pf)] Other (See Comments)  Had to be hospitalized  Shingrix (Pf) [Varicella-Zoster Ge-As01b (Pf)] Other (See Comments)  Facial Numbness and tingling-occurred 11/13/2021  Covid-19 (Sars-Cov-2) Vaccine, Ad26 Other (See  Comments)  Had to be hospitalized.   Review of Systems:  A comprehensive 14 point ROS was performed, reviewed by me today, and the pertinent orthopaedic findings are documented in the HPI.  Physical Exam: BP (!) 150/78  Ht 165.1 cm (5\' 5" )  Wt (!) 109.8 kg (242 lb)  BMI 40.27 kg/m  General/Constitutional: The patient appears to be well-nourished, well-developed, and in no acute distress. Neuro/Psych: Normal mood and affect, oriented to person, place and time. Eyes: Non-icteric. Pupils are equal, round, and reactive to light, and exhibit synchronous movement. ENT: Unremarkable. Lymphatic: No palpable adenopathy. Respiratory: Lungs clear to auscultation, Normal chest excursion, No wheezes, and Non-labored breathing Cardiovascular: Regular rate and rhythm. No murmurs. and No edema, swelling or tenderness, except as noted in detailed exam. Integumentary: No impressive skin lesions present, except as noted in detailed exam. Musculoskeletal: Unremarkable, except as noted in detailed exam.  General: Well developed, well nourished 57 y.o. female in no apparent distress. Normal affect. Normal communication. Patient answers questions appropriately. The patient presents today using a cane for assistance with ambulation, she does have a moderate limp favoring the left leg.  Left Lower Extremities: Examination of the left lower extremity reveals no bony abnormality, no edema, moderate effusion and no ecchymosis. There is no valgus or varus abnormality. The patient is moderately tender along the lateral joint line, and is moderately tender along the medial joint line. The patient is able to flex 105 degrees. She is able to fully extend the left knee but does have increased pain at the extreme of motion with both flexion and extension at this time. The patient has a positive rotational Mcmurray test. There is moderate retropatellar discomfort. The patient has a negative patella stretch test. The patient  has a negative varus stress test and a negative valgus stress test, in looking for stability. The patient has a negative Lachman's test.  Vascular: The patient has a negative Denna Haggard' test bilaterally. The patient had a normal dorsalis pedis and posterior tibial pulse. There is normal skin warmth. There is normal capillary refill bilaterally.   Neurologic: The patient has a negative straight leg raise. The patient has normal muscle strength testing for the quadriceps, calves, ankle dorsiflexion, ankle plantarflexion, and extensor hallicus longus. The patient has sensation that is intact to light touch. The deep tendon reflexes are normal at the patella and achilles. No clonus is noted.   Imaging: AP, lateral and sunrise views of the left knee were obtained at her previous visit and reviewed today by me. These x-rays do demonstrate some evidence of osteoarthritic changes. Decreased joint space involving the medial compartment of the left knee. 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 There is underlying subchondral sclerosis to the medial and lateral tibial plateau. On lateral view there does appear to be some osteophyte formation. Slight loss of patellofemoral joint space on sunrise view of the left knee. No evidence of acute fracture, dislocation or acute findings. I have also reviewed the recent x-rays obtained in the emergency room.  MRI OF THE LEFT KNEE WITHOUT CONTRAST:  1. Progressive radial tear involving the posterior horn of the  medial meniscus with medial protrusion of the meniscus estimated at  4.5 mm.  2. Intact ligamentous  structures and no acute bony findings.  3. Tricompartmental degenerative changes most significant (and  progressive) in the medial compartment.  4. Moderate-sized joint effusion and mild synovitis. Small/moderate  sized Baker's cyst containing some inflammatory debris.   Impression: 1. Primary osteoarthritis of left knee [M17.12] 2. Degenerative  tear of left medial meniscus 3. Effusion of left knee  Plan:  1. Treatment options were discussed today with the patient. 2. Repeat MRI scan of the left knee does reveal worsening degenerative changes throughout the left knee in addition to worsening extrusion of the medial meniscus tear. 3. We did discuss the pros and cons of conservative versus aggressive treatment options at this time. We did discuss viscosupplementation injections however the patient reports that her left knee pain is quite severe and she is ready to discuss more aggressive treatment options. 4. After a discussion of the pros and cons the patient would like to proceed with a left total knee arthroplasty to be scheduled with Dr. Joice Lofts in the future. 5. This document will serve as a surgical history and physical for the patient. 6. The patient will follow-up per standard postop protocol. She can contact clinic if she has any questions, new symptoms develop or symptoms worsen.  The procedure was discussed with the patient, as were the potential risks (including bleeding, infection, nerve and/or blood vessel injury, persistent or recurrent pain, failure of the hardware, stiffness, instability, need for further surgery, blood clots, strokes, heart attacks and/or arhythmias, pneumonia, etc.) and benefits. The patient states her understanding and wishes to proceed.    H&P reviewed and patient re-examined. No changes.

## 2023-03-18 NOTE — Evaluation (Signed)
Physical Therapy Evaluation Patient Details Name: Sherry Hernandez MRN: [id number] DOB: 1966-03-29 Today's Date: 03/18/2023  History of Present Illness  Patient is s/p TKA.  Clinical Impression  Patient received in bed, she is agreeable to PT session. Daughter at bedside. Patient requires min A for bed mobility and transfers. She is able to ambulate with RW, cga ~50 feet then became nauseated in hallway, requiring seated rest. Once she was feeling better she was able to continue with session, ambulated up/down 4 steps and ambulated another 20 feet with RW and cues. Exercise handout reviewed with patient and daughter. All questions answered and patient will need wide walker and 3 in 1 prior to discharging home.           If plan is discharge home, recommend the following: A little help with walking and/or transfers;A little help with bathing/dressing/bathroom;Help with stairs or ramp for entrance;Assist for transportation;Assistance with cooking/housework   Can travel by private Manufacturing systems engineer walker (2 wheels);BSC/3in1;Other (comment) (Wide walker)  Recommendations for Other Services       Functional Status Assessment Patient has had a recent decline in their functional status and demonstrates the ability to make significant improvements in function in a reasonable and predictable amount of time.     Precautions / Restrictions Precautions Precautions: Fall Restrictions Weight Bearing Restrictions: Yes LLE Weight Bearing: Weight bearing as tolerated      Mobility  Bed Mobility Overal bed mobility: Needs Assistance Bed Mobility: Supine to Sit     Supine to sit: Min assist, HOB elevated          Transfers Overall transfer level: Needs assistance Equipment used: Rolling walker (2 wheels) Transfers: Sit to/from Stand Sit to Stand: Contact guard assist           General transfer comment: cues needed for hand placement     Ambulation/Gait Ambulation/Gait assistance: Contact guard assist Gait Distance (Feet): 70 Feet Assistive device: Rolling walker (2 wheels) Gait Pattern/deviations: Step-to pattern, Decreased step length - right, Decreased step length - left, Decreased stride length, Trunk flexed Gait velocity: decr     General Gait Details: patient ambulated about 50 feet and then started dry heaving. Chair brought to patient and she was given nausea medication. She then proceeded finish ambulation.  Stairs Stairs: Yes Stairs assistance: Contact guard assist Stair Management: One rail Right, Step to pattern, Forwards Number of Stairs: 4 General stair comments: patient performed well on steps with cues for sequencing.  Wheelchair Mobility     Tilt Bed    Modified Rankin (Stroke Patients Only)       Balance Overall balance assessment: Needs assistance Sitting-balance support: Feet supported Sitting balance-Leahy Scale: Good     Standing balance support: Bilateral upper extremity supported, During functional activity, Reliant on assistive device for balance Standing balance-Leahy Scale: Fair                               Pertinent Vitals/Pain Pain Assessment Pain Assessment: Faces Faces Pain Scale: Hurts little more Pain Location: L knee Pain Descriptors / Indicators: Discomfort, Guarding, Grimacing Pain Intervention(s): Monitored during session, Repositioned, Ice applied    Home Living Family/patient expects to be discharged to:: Private residence Living Arrangements: Children Available Help at Discharge: Family;Available 24 hours/day Type of Home: House Home Access: Stairs to enter Entrance Stairs-Rails: None Entrance Stairs-Number of Steps: 2   Home Layout:  One level Home Equipment: Shower seat      Prior Function Prior Level of Function : Independent/Modified Independent             Mobility Comments: ambulated with can or no AD ADLs Comments: mod  I     Extremity/Trunk Assessment   Upper Extremity Assessment Upper Extremity Assessment: Overall WFL for tasks assessed    Lower Extremity Assessment Lower Extremity Assessment: LLE deficits/detail LLE Deficits / Details: WNL for s/p day 0 TKA LLE: Unable to fully assess due to pain LLE Coordination: decreased gross motor    Cervical / Trunk Assessment Cervical / Trunk Assessment: Normal  Communication   Communication Communication: No apparent difficulties Cueing Techniques: Verbal cues;Visual cues  Cognition Arousal: Alert Behavior During Therapy: WFL for tasks assessed/performed Overall Cognitive Status: Within Functional Limits for tasks assessed                                          General Comments      Exercises Total Joint Exercises Ankle Circles/Pumps: AROM, Both, 10 reps Quad Sets: AROM, Left, 5 reps Short Arc Quad: AAROM, Left, 5 reps Heel Slides: AROM, Left, 5 reps Hip ABduction/ADduction: AROM, Left, 5 reps Straight Leg Raises: AROM, Left, 5 reps Long Arc Quad: AROM, Left, 5 reps Knee Flexion: AROM, Left, 5 reps Goniometric ROM: 5-90 grossly   Assessment/Plan    PT Assessment Patient needs continued PT services  PT Problem List Decreased strength;Decreased range of motion;Decreased activity tolerance;Decreased balance;Decreased mobility;Decreased knowledge of use of DME;Decreased safety awareness;Decreased cognition;Decreased coordination;Pain;Impaired sensation;Obesity;Decreased skin integrity       PT Treatment Interventions DME instruction;Gait training;Stair training;Functional mobility training;Therapeutic activities;Therapeutic exercise;Manual techniques;Patient/family education;Neuromuscular re-education;Balance training;Modalities    PT Goals (Current goals can be found in the Care Plan section)  Acute Rehab PT Goals Patient Stated Goal: to improve PT Goal Formulation: With patient/family Time For Goal Achievement:  03/25/23 Potential to Achieve Goals: Good    Frequency BID     Co-evaluation               AM-PAC PT "6 Clicks" Mobility  Outcome Measure Help needed turning from your back to your side while in a flat bed without using bedrails?: A Little Help needed moving from lying on your back to sitting on the side of a flat bed without using bedrails?: A Little Help needed moving to and from a bed to a chair (including a wheelchair)?: A Little Help needed standing up from a chair using your arms (e.g., wheelchair or bedside chair)?: A Little Help needed to walk in hospital room?: A Little Help needed climbing 3-5 steps with a railing? : A Little 6 Click Score: 18    End of Session Equipment Utilized During Treatment: Gait belt Activity Tolerance: Patient tolerated treatment well;Other (comment) (nausea) Patient left: in chair;with family/visitor present Nurse Communication: Mobility status PT Visit Diagnosis: Other abnormalities of gait and mobility (R26.89);Muscle weakness (generalized) (M62.81);Difficulty in walking, not elsewhere classified (R26.2);Pain Pain - Right/Left: Left Pain - part of body: Knee    Time: 2130-8657 PT Time Calculation (min) (ACUTE ONLY): 43 min   Charges:   PT Evaluation $PT Eval Moderate Complexity: 1 Mod PT Treatments $Gait Training: 8-22 mins $Therapeutic Exercise: 8-22 mins PT General Charges $$ ACUTE PT VISIT: 1 Visit         Treyveon Mochizuki, PT, GCS 03/18/23,2:58 PM

## 2023-03-18 NOTE — Transfer of Care (Signed)
Immediate Anesthesia Transfer of Care Note  Patient: Sherry Hernandez  Procedure(s) Performed: TOTAL KNEE ARTHROPLASTY (Left: Knee)  Patient Location: PACU  Anesthesia Type:Spinal  Level of Consciousness: drowsy  Airway & Oxygen Therapy: Patient Spontanous Breathing and Patient connected to face mask oxygen  Post-op Assessment: Report given to RN and Post -op Vital signs reviewed and stable  Post vital signs: Reviewed  Last Vitals:  Vitals Value Taken Time  BP 128/66 03/18/23 1004  Temp 28F   Pulse 77 03/18/23 1008  Resp 13 03/18/23 1008  SpO2 100 % 03/18/23 1008  Vitals shown include unfiled device data.  Last Pain:  Vitals:   03/18/23 0618  TempSrc: Temporal  PainSc: 0-No pain      Patients Stated Pain Goal: 0 (03/18/23 0618)  Complications: No notable events documented.

## 2023-03-18 NOTE — Anesthesia Preprocedure Evaluation (Signed)
Anesthesia Evaluation  Patient identified by MRN, date of birth, ID band Patient awake    Reviewed: Allergy & Precautions, H&P , NPO status , Patient's Chart, lab work & pertinent test results, reviewed documented beta blocker date and time   Airway Mallampati: II   Neck ROM: full    Dental  (+) Poor Dentition   Pulmonary pneumonia, resolved, former smoker   Pulmonary exam normal        Cardiovascular Exercise Tolerance: Poor hypertension, On Medications negative cardio ROS Normal cardiovascular exam Rhythm:regular Rate:Normal     Neuro/Psych  Neuromuscular disease  negative psych ROS   GI/Hepatic negative GI ROS, Neg liver ROS,,,  Endo/Other    Morbid obesity  Renal/GU negative Renal ROS  negative genitourinary   Musculoskeletal   Abdominal   Peds  Hematology negative hematology ROS (+)   Anesthesia Other Findings Past Medical History: No date: Degenerative tear of left medial meniscus No date: Family history of adverse reaction to anesthesia     Comment:  a.) PONV in 1st degree relative (mother) No date: HTN (hypertension) No date: Neuromuscular disorder (HCC) No date: Obesity No date: Overactive bladder No date: Pre-diabetes No date: Primary osteoarthritis of left knee No date: Sensory urge incontinence No date: Stress incontinence Past Surgical History: No date: ABDOMINAL HYSTERECTOMY 03/24/2015: COLONOSCOPY WITH PROPOFOL; N/A     Comment:  Procedure: COLONOSCOPY WITH PROPOFOL;  Surgeon: Elnita Maxwell, MD;  Location: [facility] ENDOSCOPY;  Service:               Endoscopy;  Laterality: N/A; BMI    Body Mass Index: 44.97 kg/m     Reproductive/Obstetrics negative OB ROS                             Anesthesia Physical Anesthesia Plan  ASA: 3  Anesthesia Plan: Spinal   Post-op Pain Management:    Induction:   PONV Risk Score and Plan: 3  Airway  Management Planned:   Additional Equipment:   Intra-op Plan:   Post-operative Plan:   Informed Consent: I have reviewed the patients History and Physical, chart, labs and discussed the procedure including the risks, benefits and alternatives for the proposed anesthesia with the patient or authorized representative who has indicated his/her understanding and acceptance.     Dental Advisory Given  Plan Discussed with: CRNA  Anesthesia Plan Comments:        Anesthesia Quick Evaluation

## 2023-03-18 NOTE — Op Note (Signed)
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  Post-Op Diagnosis:   Same  Procedure:   Left TKA using all-press-fit Zimmer Persona system with a #7 PCR femur, a(n) E-sized  tibial tray with a 10 mm medial congruent E-poly insert, and a 35 x 9.5 mm all-poly 3-pegged domed patella.  Surgeon:   Maryagnes Amos, MD  Assistant:   Horris Latino, PA-C; Jacqulyn Liner, PA-S  Anesthesia:   Spinal  Findings:   As above  Complications:   None  EBL:   10 cc  Fluids:   700 cc crystalloid  UOP:   None  TT:   90 minutes at 300 mmHg  Drains:   None  Closure:   Staples  Implants:   As above  Brief Clinical Note:   The patient is a 57 year old female with a long history of progressively worsening left knee pain. The patient's symptoms have progressed despite medications, activity modification, injections, etc. The patient's history and examination were consistent with a progressively worsening medial posterior root tear with progressive compartmental degenerative joint disease of the left knee confirmed by plain radiographs and MRI scan. The patient presents at this time for a left total knee arthroplasty.  Procedure:   The patient was brought into the operating room. After adequate spinal anesthesia was obtained, the patient was repositioned in the supine position on the operating room table. The left lower extremity was prepped with ChloraPrep solution and draped sterilely. Preoperative antibiotics were administered. A timeout was performed to verify the appropriate surgical site before the limb was exsanguinated with an Esmarch and the tourniquet inflated to 300 mmHg.   A standard anterior approach to the knee was made through an approximately 6-7 inch incision. The incision was carried down through the subcutaneous tissues to expose superficial retinaculum. This was split the length of the incision and the  medial flap elevated sufficiently to expose the medial retinaculum. The medial retinaculum was incised, leaving a 3-4 mm cuff of tissue on the patella. This was extended distally along the medial border of the patellar tendon and proximally through the medial third of the quadriceps tendon. A subtotal fat pad excision was performed before the soft tissues were elevated off the anteromedial and anterolateral aspects of the proximal tibia to the level of the collateral ligaments. The anterior portions of the medial and lateral menisci were removed, as was the anterior cruciate ligament. With the knee flexed to 90, the external tibial guide was positioned and the appropriate proximal tibial cut made. This piece was taken to the back table where it was measured and found to be optimally replicated by a(n) E-sized component.  Attention was directed to the distal femur. The intramedullary canal was accessed through a 3/8" drill hole. The intramedullary guide was inserted and positioned in order to obtain a neutral flexion gap. The distal cutting block was placed at 5 of valgus alignment. Using the +0 slot, the distal cut was made. The distal femur was measured and found to be optimally replicated by the #7 component. The #7 4-in-1 cutting block was positioned and first the posterior, then the posterior chamfer, the anterior chamfer, and finally the anterior cuts were made after verifying that the anterior cortex would not be notched.   At this point, the posterior portions medial and lateral menisci were removed. A trial reduction was performed using the appropriate femoral and tibial components  with the 10 mm insert. This demonstrated excellent stability to varus and valgus stressing both in flexion and extension while permitting full extension. Patellar tracking was assessed and found to be excellent. Therefore, the tibial trial position was marked on the proximal tibia. The patella thickness was measured and  found to be 20 mm. Therefore, the appropriate cut was made. The patellar surface was measured and found to be optimally replicated by the 35 mm component. The three peg holes were drilled in place before the trial button was inserted. Patella tracking was assessed and found to be excellent, passing the "no thumb test". The lug holes were drilled into the distal femur before the trial component was removed.  The tibial tray was repositioned before the keel was created using the appropriate tower, reamers, and punch.  The bony surfaces were prepared for implantation by irrigating them thoroughly with sterile saline solution via the jet lavage system. A bone plug was fashioned from some of the bone that had been removed previously and used to plug the distal femoral canal. In addition, a "cocktail" of 20 cc of Exparel, 30 cc of 0.5% Sensorcaine, 2 cc of Kenalog 40 (80 mg), and 30 mg of Toradol diluted out to 90 cc with normal saline was injected into the postero-medial and postero-lateral aspects of the knee, the medial and lateral gutter regions, and the peri-incisional tissues to help with postoperative analgesia.   The tibial tray was impacted into place in first. Next, the femoral component was impacted into place. The 10 mm medial congruent insert insert was snapped into place with care taken to ensure appropriate locking of the insert and the knee brought into extension. Finally, the patella was positioned and compressed into place using the appropriate patellar clamping mechanism. The knee was placed through a range of motion with the findings as described above.   The wound was copiously irrigated with sterile saline solution using the jet lavage system before the quadriceps tendon and retinacular layer were reapproximated using #0 Vicryl interrupted sutures. The superficial retinacular layer also was closed using a running #0 Vicryl suture. The subcutaneous tissues were closed in several layers using 2-0  Vicryl interrupted sutures. The skin was closed using staples. A sterile honeycomb dressing was applied to the skin before the leg was wrapped with an Ace wrap to accommodate the Polar Care device. The patient was then awakened and returned to the recovery room in satisfactory condition after tolerating the procedure well.

## 2023-03-18 NOTE — Anesthesia Procedure Notes (Signed)
Spinal  Patient location during procedure: OR Start time: 03/18/2023 7:35 AM End time: 03/18/2023 7:51 AM Reason for block: surgical anesthesia Staffing Performed: anesthesiologist and resident/CRNA  Anesthesiologist: Yevette Edwards, MD Resident/CRNA: Merlene Pulling, CRNA Performed by: Merlene Pulling, CRNA Authorized by: Yevette Edwards, MD   Preanesthetic Checklist Completed: patient identified, IV checked, site marked, risks and benefits discussed, surgical consent, monitors and equipment checked, pre-op evaluation and timeout performed Spinal Block Patient position: sitting Prep: Betadine Patient monitoring: heart rate, continuous pulse ox, blood pressure and cardiac monitor Approach: midline Location: L3-4 Injection technique: single-shot Needle Needle type: Whitacre and Introducer  Needle gauge: 24 G Needle length: 9 cm Assessment Events: CSF return Additional Notes Negative paresthesia. Negative blood return. Positive free-flowing CSF. Expiration date of kit checked and confirmed. Patient tolerated procedure well, without complications.

## 2023-03-18 NOTE — Discharge Instructions (Addendum)
Orthopedic discharge instructions: May shower with intact OpSite dressing. Apply ice frequently to knee or use Polar Care. Start Eliquis 1 tablet (2.5 mg) twice daily on Wednesday, 03/18/2023, for 2 weeks, then take aspirin 325 mg twice daily for 4 weeks. Take pain medication as prescribed when needed.  May supplement with ES Tylenol if necessary. May weight-bear as tolerated on left leg - use walker for balance and support. Follow-up in 10-14 days or as scheduled     .AMBULATORY SURGERY  DISCHARGE INSTRUCTIONS   The drugs that you were given will stay in your system until tomorrow so for the next 24 hours you should not:  Drive an automobile Make any legal decisions Drink any alcoholic beverage   You may resume regular meals tomorrow.  Today it is better to start with liquids and gradually work up to solid foods.  You may eat anything you prefer, but it is better to start with liquids, then soup and crackers, and gradually work up to solid foods.   Please notify your doctor immediately if you have any unusual bleeding, trouble breathing, redness and pain at the surgery site, drainage, fever, or pain not relieved by medication.   Additional Instructions:   leave green armband on for 4 days   POLAR CARE INFORMATION  MassAdvertisement.it  How to use Breg Polar Care [facility] Therapy System?  YouTube   ShippingScam.co.uk  OPERATING INSTRUCTIONS  Start the product With dry hands, connect the transformer to the electrical connection located on the top of the cooler. Next, plug the transformer into an appropriate electrical outlet. The unit will automatically start running at this point.  To stop the pump, disconnect electrical power.  Unplug to stop the product when not in use. Unplugging the Polar Care unit turns it off. Always unplug immediately after use. Never leave it plugged in while unattended. Remove pad.    FIRST ADD WATER TO FILL LINE, THEN  ICE---Replace ice when existing ice is almost melted  1 Discuss Treatment with your Licensed Health Care Practitioner and Use Only as Prescribed 2 Apply Insulation Barrier & Cold Therapy Pad 3 Check for Moisture 4 Inspect Skin Regularly  Tips and Trouble Shooting Usage Tips 1. Use cubed or chunked ice for optimal performance. 2. It is recommended to drain the Pad between uses. To drain the pad, hold the Pad upright with the hose pointed toward the ground. Depress the black plunger and allow water to drain out. 3. You may disconnect the Pad from the unit without removing the pad from the affected area by depressing the silver tabs on the hose coupling and gently pulling the hoses apart. The Pad and unit will seal itself and will not leak. Note: Some dripping during release is normal. 4. DO NOT RUN PUMP WITHOUT WATER! The pump in this unit is designed to run with water. Running the unit without water will cause permanent damage to the pump. 5. Unplug unit before removing lid.  TROUBLESHOOTING GUIDE Pump not running, Water not flowing to the pad, Pad is not getting cold 1. Make sure the transformer is plugged into the wall outlet. 2. Confirm that the ice and water are filled to the indicated levels. 3. Make sure there are no kinks in the pad. 4. Gently pull on the blue tube to make sure the tube/pad junction is straight. 5. Remove the pad from the treatment site and ll it while the pad is lying at; then reapply. 6. Confirm that the pad couplings are  securely attached to the unit. Listen for the double clicks (Figure 1) to confirm the pad couplings are securely attached.  Leaks    Note: Some condensation on the lines, controller, and pads is unavoidable, especially in warmer climates. 1. If using a Breg Polar Care Cold Therapy unit with a detachable Cold Therapy Pad, and a leak exists (other than condensation on the lines) disconnect the pad couplings. Make sure the silver tabs on the couplings  are depressed before reconnecting the pad to the pump hose; then confirm both sides of the coupling are properly clicked in. 2. If the coupling continues to leak or a leak is detected in the pad itself, stop using it and call Breg Customer Care at [phone].  Cleaning After use, empty and dry the unit with a soft cloth. Warm water and mild detergent may be used occasionally to clean the pump and tubes.  WARNING: The Polar Care Cube can be cold enough to cause serious injury, including full skin necrosis. Follow these Operating Instructions, and carefully read the Product Insert (see pouch on side of unit) and the Cold Therapy Pad Fitting Instructions (provided with each Cold Therapy Pad) prior to use.

## 2023-03-18 NOTE — TOC Progression Note (Signed)
Transition of Care [facility]) - Progression Note    Patient Details  Name: Sherry Hernandez MRN: [id number] Date of Birth: [birth year]  [facility] CM/SW Contact  Marlowe Sax, RN Phone Number: 03/18/2023, 10:56 AM  Clinical Narrative:     [facility] set up fopr HH prior to surgery by surgeons office [employer] to deliver a RW and 3 in 1 to the bedside prior to DC  Expected Discharge Plan: Home w Home Health Services Barriers to Discharge: No Barriers Identified  Expected Discharge Plan and Services   Discharge Planning Services: CM Consult   Living arrangements for the past 2 months: Single Family Home Expected Discharge Date: 03/18/23               DME Arranged: Dan Humphreys rolling, 3-N-1 DME Agency: [employer] Date DME Agency Contacted: 03/18/23 Time DME Agency Contacted: 1055 Representative spoke with at DME Agency: Osvaldo Angst Arranged: PT HH Agency: [facility] Date [facility] Agency Contacted: 03/18/23 Time HH Agency Contacted: [id number] Representative spoke with at [facility] Agency: [place]   Social Determinants of Health (SDOH) Interventions SDOH Screenings   Physical Activity: Unknown (03/21/2019)  Social Connections: Unknown (03/21/2019)  Tobacco Use: Medium Risk (03/18/2023)    Readmission Risk Interventions     No data to display

## 2023-03-19 ENCOUNTER — Encounter: Payer: Self-pay | Admitting: Surgery

## 2023-03-20 NOTE — Anesthesia Postprocedure Evaluation (Signed)
Anesthesia Post Note  Patient: Sherry Hernandez  Procedure(s) Performed: TOTAL KNEE ARTHROPLASTY (Left: Knee)  Patient location during evaluation: Other (Pt discharged home day of surgery) Anesthesia Type: Spinal Pain management: pain level controlled Vital Signs Assessment: post-procedure vital signs reviewed and stable Respiratory status: spontaneous breathing and respiratory function stable Cardiovascular status: stable Postop Assessment: no headache, no backache, spinal receding, patient able to bend at knees, no apparent nausea or vomiting, able to ambulate and adequate PO intake Anesthetic complications: no   No notable events documented.   Last Vitals:  Vitals:   03/18/23 1146 03/18/23 1502  BP: (!) 123/53 (!) 138/59  Pulse: 97   Resp: 16   Temp:    SpO2: 94%     Last Pain:  Vitals:   03/18/23 1502  TempSrc:   PainSc: 4                  Zachary George

## 2023-04-07 ENCOUNTER — Encounter: Payer: Self-pay | Admitting: Urology

## 2023-04-07 ENCOUNTER — Ambulatory Visit (INDEPENDENT_AMBULATORY_CARE_PROVIDER_SITE_OTHER): Payer: Medicaid Other | Admitting: Urology

## 2023-04-07 VITALS — BP 140/80 | HR 88 | Ht 66.0 in | Wt 275.0 lb

## 2023-04-07 DIAGNOSIS — N3946 Mixed incontinence: Secondary | ICD-10-CM

## 2023-04-07 MED ORDER — CIPROFLOXACIN HCL 250 MG PO TABS: 250.0000 mg | ORAL_TABLET | Freq: Two times a day (BID) | ORAL | 0 refills | Status: AC

## 2023-04-07 MED ORDER — TRIMETHOPRIM 100 MG PO TABS: 100.0000 mg | ORAL_TABLET | Freq: Every day | ORAL | 11 refills | Status: DC

## 2023-04-07 NOTE — Progress Notes (Addendum)
04/07/2023 9:57 AM   Nadara Mode January 25, 1966 [id number]  Referring provider: Rayetta Humphrey, MD [address] [place],  [place] [address]  No chief complaint on file.   HPI: Dr. Marlou Porch 2016: mixed incontinence, possible elevated residual, mild cystocele, previous urodynamics.  Failed Myrbetriq on oxybutynin.  Recommend clean intermittent catheterization   Today I was consulted to assess the patient's urinary incontinence.  She has urge incontinence as a primary complaint.  She also leaks with coughing sneezing bending lifting.  No bedwetting.  She says she soaks 8 or 9 pads a day   Voids at least hourly and cannot hold it for 2 hours.  Gets up at least 5 or 6 times a night and does have ankle edema.  Reports good flow   Some of the details were difficult to ascertain but she thinks she gets at least 4 bladder infections a year with burning and frequency that respond favorably to antibiotics.  She says she recently wiped and saw a little bit of blood.  She has had a hysterectomy   She is on oral hypoglycemics.  She uses a cane because of her swollen left knee and is going to have an MRI   Pelvic examination patient had mild grade 2 hypermobility bladder neck.  No stress incontinence with light cough.  No significant prolapse   Post void residual today was 41 mL    Patient has high-volume mixed incontinence and significant frequency and nocturia.  She may be getting recurrent bladder infections.  She saw a very small amount of blood with wiping in the last few weeks.  Call if culture positive.  Return with hematuria CT scan and cystoscopy.  Called and Vesicare 5 mg 30 x 11 patient understands that we would need to repeat the urodynamics in the future depending on the results.  She agreed with proceeding with urodynamics if needed   Today Frequency stable.  Incontinence stable.  Patient did not have the CT scan.  Last urine culture was negative Vesicare helped initially a modest  amount but the effect wore off Cystoscopy: patient underwent flexible cystoscopy.  Urine is very cloudy with a lot of white flecks.  Near the floor the bladder there was a little bit of erythema or congested blood vessels submucosal in keeping with acute cystitis.  As the bladder filled the rest of the urothelium was within normal limits.  No carcinoma.  She quit smoking many years ago.   In retrospect, yesterday patient was having some burning and thought she might be infected and did a urine test with her daughter.  She took some AZO standard Pelvic examination was little bit limited due to recent left knee surgery at she did great.  She had grade 2 hypermobility the bladder neck and no stress incontinence after cystoscopy with a modest cough I reviewed medical record and no recent positive cultures     PMH: Past Medical History:  Diagnosis Date   Degenerative tear of left medial meniscus    Family history of adverse reaction to anesthesia    a.) PONV in 1st degree relative (mother)   HTN (hypertension)    Neuromuscular disorder (HCC)    Obesity    Overactive bladder    Pre-diabetes    Primary osteoarthritis of left knee    Sensory urge incontinence    Stress incontinence     Surgical History: Past Surgical History:  Procedure Laterality Date   ABDOMINAL HYSTERECTOMY     COLONOSCOPY  WITH PROPOFOL N/A 03/24/2015   Procedure: COLONOSCOPY WITH PROPOFOL;  Surgeon: Elnita Maxwell, MD;  Location: [facility]COPY;  Service: Endoscopy;  Laterality: N/A;   TOTAL KNEE ARTHROPLASTY Left 03/18/2023   Procedure: TOTAL KNEE ARTHROPLASTY;  Surgeon: Christena Flake, MD;  Location: [facility] ORS;  Service: Orthopedics;  Laterality: Left;    Home Medications:  Allergies as of 04/07/2023   No Known Allergies      Medication List        Accurate as of April 07, 2023  9:57 AM. If you have any questions, ask your nurse or doctor.          acetaminophen 500 MG tablet Commonly known as:  TYLENOL Take 1,000 mg by mouth every 6 (six) hours as needed for mild pain or headache.   apixaban 2.5 MG Tabs tablet Commonly known as: Eliquis Take 1 tablet (2.5 mg total) by mouth 2 (two) times daily.   lisinopril-hydrochlorothiazide 10-12.5 MG tablet Commonly known as: ZESTORETIC Take 1 tablet by mouth daily.   metFORMIN 500 MG tablet Commonly known as: GLUCOPHAGE Take 500 mg by mouth daily.   multivitamin-iron-minerals-folic acid chewable tablet Chew 2 tablets by mouth daily.   oxyCODONE 5 MG immediate release tablet Commonly known as: Roxicodone Take 1-2 tablets (5-10 mg total) by mouth every 4 (four) hours as needed for moderate pain or severe pain.   solifenacin 5 MG tablet Commonly known as: VESICARE Take 1 tablet (5 mg total) by mouth daily.        Allergies: No Known Allergies  Family History: Family History  Problem Relation Age of Onset   Cancer Mother    Breast cancer Mother 10   Cancer Father    Cancer Sister    Breast cancer Sister        30    Social History:  reports that she quit smoking about 15 years ago. Her smoking use included cigarettes. She started smoking about 22 years ago. She has never used smokeless tobacco. She reports that she does not drink alcohol and does not use drugs.  ROS:                                        Physical Exam: There were no vitals taken for this visit.  Constitutional:  Alert and oriented, No acute distress. HEENT: [place] AT, moist mucus membranes.  Trachea midline, no masses.   Laboratory Data: Lab Results  Component Value Date   WBC 4.5 03/12/2023   HGB 13.1 03/12/2023   HCT 39.7 03/12/2023   MCV 88.0 03/12/2023   PLT 198 03/12/2023    Lab Results  Component Value Date   CREATININE 0.89 03/12/2023    No results found for: "PSA"  No results found for: "TESTOSTERONE"  No results found for: "HGBA1C"  Urinalysis    Component Value Date/Time   COLORURINE STRAW (A)  03/12/2023 1154   APPEARANCEUR CLEAR (A) 03/12/2023 1154   APPEARANCEUR Clear 02/10/2023 0843   LABSPEC 1.008 03/12/2023 1154   LABSPEC 1.009 04/08/2013 2258   PHURINE 7.0 03/12/2023 1154   GLUCOSEU NEGATIVE 03/12/2023 1154   GLUCOSEU Negative 04/08/2013 2258   HGBUR NEGATIVE 03/12/2023 1154   BILIRUBINUR NEGATIVE 03/12/2023 1154   BILIRUBINUR Negative 02/10/2023 0843   BILIRUBINUR Negative 04/08/2013 2258   KETONESUR NEGATIVE 03/12/2023 1154   PROTEINUR NEGATIVE 03/12/2023 1154   NITRITE NEGATIVE 03/12/2023 1154  LEUKOCYTESUR NEGATIVE 03/12/2023 1154   LEUKOCYTESUR Negative 04/08/2013 2258    Pertinent Imaging: Urine reviewed and sent for culture  Assessment & Plan: Patient  has high-volume mixed incontinence.  She just had knee surgery.  It does appear that the patient gets recurrent urinary tract infections.  I called in ciprofloxacin 250 mg twice a day for 7 days.  I will see her back in 6 weeks on trimethoprim suppression therapy.  This will also allow her knee to heal.  I will then be ordering urodynamics likely.  I want to see if her mixed symptoms down regulate some on trimethoprim.  Call if culture differs  I will ask about the CT scan next visit  1. Mixed stress and urge urinary incontinence  - Urinalysis, Complete   No follow-ups on file.  Martina Sinner, MD  [facility] [address] [place], [place] [address] [phone]

## 2023-04-08 LAB — URINALYSIS, COMPLETE
Bilirubin, UA: NEGATIVE
Glucose, UA: NEGATIVE
Ketones, UA: NEGATIVE
Nitrite, UA: NEGATIVE
Protein,UA: NEGATIVE
RBC, UA: NEGATIVE
Specific Gravity, UA: <1.005 — ABNORMAL LOW (ref 1.005–1.030)
Urobilinogen, Ur: 0.2 mg/dL (ref 0.2–1.0)
pH, UA: 5.5 (ref 5.0–7.5)

## 2023-04-08 LAB — MICROSCOPIC EXAMINATION: Bacteria, UA: NONE SEEN

## 2023-04-10 LAB — CULTURE, URINE COMPREHENSIVE

## 2023-04-21 ENCOUNTER — Ambulatory Visit: Payer: 59 | Admitting: Nurse Practitioner

## 2023-04-21 NOTE — Progress Notes (Deleted)
   There were no vitals taken for this visit.   Subjective:    Patient ID: Sherry Hernandez, female    DOB: Dec 06, 1965, 57 y.o.   MRN: [id number]  HPI: Sherry Hernandez is a 56 y.o. female  No chief complaint on file.  Establish care: her last physical was ***.  Medical history includes ***.  Family history includes ***.  Health maintenance ***.   Hypertension:  -Medications: *** -Patient is compliant with above medications and reports no side effects. -Checking BP at home (average): *** -Highest BP at home: *** -Lowest BP at home: *** -Denies any SOB, CP, vision changes, LE edema or symptoms of hypotension -Diet: *** -Exercise: ***   Relevant past medical, surgical, family and social history reviewed and updated as indicated. Interim medical history since our last visit reviewed. Allergies and medications reviewed and updated.  Review of Systems  Constitutional: Negative for fever or weight change.  Respiratory: Negative for cough and shortness of breath.   Cardiovascular: Negative for chest pain or palpitations.  Gastrointestinal: Negative for abdominal pain, no bowel changes.  Musculoskeletal: Negative for gait problem or joint swelling.  Skin: Negative for rash.  Neurological: Negative for dizziness or headache.  No other specific complaints in a complete review of systems (except as listed in HPI above).      Objective:    There were no vitals taken for this visit.  Wt Readings from Last 3 Encounters:  04/07/23 275 lb (124.7 kg)  03/18/23 278 lb 9.6 oz (126.4 kg)  03/12/23 278 lb 9.6 oz (126.4 kg)    Physical Exam  Constitutional: Patient appears well-developed and well-nourished. Obese *** No distress.  HEENT: head atraumatic, normocephalic, pupils equal and reactive to light, ears ***, neck supple, throat within normal limits Cardiovascular: Normal rate, regular rhythm and normal heart sounds.  No murmur heard. No BLE edema. Pulmonary/Chest: Effort normal and  breath sounds normal. No respiratory distress. Abdominal: Soft.  There is no tenderness. Psychiatric: Patient has a normal mood and affect. behavior is normal. Judgment and thought content normal.  Results for orders placed or performed in visit on 04/07/23  CULTURE, URINE COMPREHENSIVE   Specimen: Urine   UR  Result Value Ref Range   Urine Culture, Comprehensive Final report    Organism ID, Bacteria Comment   Microscopic Examination   Urine  Result Value Ref Range   WBC, UA 11-30 (A) 0 - 5 /hpf   RBC, Urine 0-2 0 - 2 /hpf   Epithelial Cells (non renal) 0-10 0 - 10 /hpf   Bacteria, UA None seen None seen/Few  Urinalysis, Complete  Result Value Ref Range   Specific Gravity, UA <1.005 (L) 1.005 - 1.030   pH, UA 5.5 5.0 - 7.5   Color, UA Yellow Yellow   Appearance Ur Hazy (A) Clear   Leukocytes,UA 1+ (A) Negative   Protein,UA Negative Negative/Trace   Glucose, UA Negative Negative   Ketones, UA Negative Negative   RBC, UA Negative Negative   Bilirubin, UA Negative Negative   Urobilinogen, Ur 0.2 0.2 - 1.0 mg/dL   Nitrite, UA Negative Negative   Microscopic Examination See below:       Assessment & Plan:   Problem List Items Addressed This Visit   None    Follow up plan: No follow-ups on file.

## 2023-05-13 ENCOUNTER — Encounter: Payer: Self-pay | Admitting: Family Medicine

## 2023-05-13 ENCOUNTER — Other Ambulatory Visit: Payer: Self-pay | Admitting: Family Medicine

## 2023-05-13 DIAGNOSIS — Z1231 Encounter for screening mammogram for malignant neoplasm of breast: Secondary | ICD-10-CM

## 2023-05-26 ENCOUNTER — Ambulatory Visit: Payer: Medicaid Other | Admitting: Urology

## 2023-05-26 ENCOUNTER — Encounter: Payer: Self-pay | Admitting: Urology

## 2023-05-26 VITALS — BP 158/83 | HR 80

## 2023-05-26 DIAGNOSIS — N3946 Mixed incontinence: Secondary | ICD-10-CM

## 2023-05-26 LAB — URINALYSIS, COMPLETE
Bilirubin, UA: NEGATIVE
Glucose, UA: NEGATIVE
Ketones, UA: NEGATIVE
Leukocytes,UA: NEGATIVE
Nitrite, UA: POSITIVE — AB
Protein,UA: NEGATIVE
RBC, UA: NEGATIVE
Specific Gravity, UA: <1.005 — ABNORMAL LOW (ref 1.005–1.030)
Urobilinogen, Ur: 0.2 mg/dL (ref 0.2–1.0)
pH, UA: 6 (ref 5.0–7.5)

## 2023-05-26 LAB — MICROSCOPIC EXAMINATION

## 2023-05-26 MED ORDER — OXYBUTYNIN CHLORIDE ER 10 MG PO TB24: 10.0000 mg | ORAL_TABLET | Freq: Every day | ORAL | 11 refills | Status: DC

## 2023-05-26 NOTE — Progress Notes (Signed)
05/26/2023 10:07 AM   Sherry Hernandez [birth year] [id number]  Referring provider: Rayetta Humphrey, MD [address] [place],  [place] [address]  Chief Complaint  Patient presents with   Follow-up    HPI: Dr. Marlou Porch 2016: mixed incontinence, possible elevated residual, mild cystocele, previous urodynamics.  Failed Myrbetriq on oxybutynin.  Recommend clean intermittent catheterization   Today I was consulted to assess the patient's urinary incontinence.  She has urge incontinence as a primary complaint.  She also leaks with coughing sneezing bending lifting.  No bedwetting.  She says she soaks 8 or 9 pads a day   Voids at least hourly and cannot hold it for 2 hours.  Gets up at least 5 or 6 times a night and does have ankle edema.  Reports good flow   Some of the details were difficult to ascertain but she thinks she gets at least 4 bladder infections a year with burning and frequency that respond favorably to antibiotics.  She says she recently wiped and saw a little bit of blood.  She has had a hysterectomy   She is on oral hypoglycemics.  She uses a cane because of her swollen left knee and is going to have an MRI   Pelvic examination patient had mild grade 2 hypermobility bladder neck.  No stress incontinence with light cough.  No significant prolapse   Post void residual today was 41 mL     Patient has high-volume mixed incontinence and significant frequency and nocturia.  She may be getting recurrent bladder infections.  She saw a very small amount of blood with wiping in the last few weeks.  Call if culture positive.  Return with hematuria CT scan and cystoscopy.  Called and Vesicare 5 mg 30 x 11 patient understands that we would need to repeat the urodynamics in the future depending on the results.    Patient did not have the CT scan.  Last urine culture was negative Vesicare helped initially a modest amount but the effect wore off Cystoscopy: patient underwent flexible  cystoscopy.  Urine is very cloudy with a lot of white flecks.  Near the floor the bladder there was a little bit of erythema or congested blood vessels submucosal in keeping with acute cystitis.  As the bladder filled the rest of the urothelium was within normal limits.  No carcinoma.  She quit smoking many years ago.   In retrospect, yesterday patient was having some burning and thought she might be infected and did a urine test with her daughter.  She took some AZO standard  Pelvic examination was little bit limited due to recent left knee surgery at she did great.  She had grade 2 hypermobility the bladder neck and no stress incontinence after cystoscopy with a modest cough I reviewed medical record and no recent positive cultures    Patient  has high-volume mixed incontinence.  She just had knee surgery.  It does appear that the patient gets recurrent urinary tract infections.  I called in ciprofloxacin 250 mg twice a day for 7 days.  I will see her back in 6 weeks on trimethoprim suppression therapy.  This will also allow her knee to heal.  I will then be ordering urodynamics likely.  I want to see if her mixed symptoms down regulate some on trimethoprim.  Call if culture   Today Frequency stable.  Last culture negative.  The last 2 urinalysis did not show blood in the urine No blood  in urine today.  Burning is gone since she took the antibiotic.  She did not renew the trimethoprim  Still has incontinence and frequency.  Because of fluid shortage I will not order urodynamics yet and she agreed with my plan     PMH: Past Medical History:  Diagnosis Date   Degenerative tear of left medial meniscus    Family history of adverse reaction to anesthesia    a.) PONV in 1st degree relative (mother)   HTN (hypertension)    Neuromuscular disorder (HCC)    Obesity    Overactive bladder    Pre-diabetes    Primary osteoarthritis of left knee    Sensory urge incontinence    Stress incontinence      Surgical History: Past Surgical History:  Procedure Laterality Date   ABDOMINAL HYSTERECTOMY     COLONOSCOPY WITH PROPOFOL N/A 03/24/2015   Procedure: COLONOSCOPY WITH PROPOFOL;  Surgeon: Elnita Maxwell, MD;  [facility] ENDOSCOPY;  Service: Endoscopy;  Laterality: N/A;   TOTAL KNEE ARTHROPLASTY Left 03/18/2023   Procedure: TOTAL KNEE ARTHROPLASTY;  Surgeon: Christena Flake, MD;  Location: [facility] ORS;  Service: Orthopedics;  Laterality: Left;    Home Medications:  Allergies as of 05/26/2023       Reactions   Pneumococcal 20-val Conj Vacc Other (See Comments)   Had to be hospitalized   Varicella Virus Vaccine Live Other (See Comments)   Facial Numbness and tingling-occurred 11/13/2021   Covid-19 (adenovirus) Vaccine Other (See Comments)   Had to be hospitalized.        Medication List        Accurate as of May 26, 2023 10:07 AM. If you have any questions, ask your nurse or doctor.          STOP taking these medications    apixaban 2.5 MG Tabs tablet Commonly known as: Eliquis Stopped by: Lorin Picket A Raha Tennison   solifenacin 5 MG tablet Commonly known as: VESICARE Stopped by: Lorin Picket A Georgann Bramble       TAKE these medications    acetaminophen 500 MG tablet Commonly known as: TYLENOL Take 1,000 mg by mouth every 6 (six) hours as needed for mild pain or headache.   cefdinir 300 MG capsule Commonly known as: OMNICEF Take 300 mg by mouth 2 (two) times daily.   lisinopril-hydrochlorothiazide 10-12.5 MG tablet Commonly known as: ZESTORETIC Take 1 tablet by mouth daily.   metFORMIN 500 MG tablet Commonly known as: GLUCOPHAGE Take 500 mg by mouth daily.   multivitamin-iron-minerals-folic acid chewable tablet Chew 2 tablets by mouth daily.   oxyCODONE 5 MG immediate release tablet Commonly known as: Roxicodone Take 1-2 tablets (5-10 mg total) by mouth every 4 (four) hours as needed for moderate pain or severe pain.   sertraline 25 MG tablet Commonly  known as: ZOLOFT Take 25 mg by mouth daily.   trimethoprim 100 MG tablet Commonly known as: TRIMPEX Take 1 tablet (100 mg total) by mouth daily.        Allergies:  Allergies  Allergen Reactions   Pneumococcal 20-Val Conj Vacc Other (See Comments)    Had to be hospitalized   Varicella Virus Vaccine Live Other (See Comments)    Facial Numbness and tingling-occurred 11/13/2021   Covid-19 (Adenovirus) Vaccine Other (See Comments)    Had to be hospitalized.    Family History: Family History  Problem Relation Age of Onset   Cancer Mother    Breast cancer Mother 36   Cancer Father  Cancer Sister    Breast cancer Sister        30    Social History:  reports that she quit smoking about 15 years ago. Her smoking use included cigarettes. She started smoking about 22 years ago. She has been exposed to tobacco smoke. She has never used smokeless tobacco. She reports that she does not drink alcohol and does not use drugs.  ROS:                                        Physical Exam: BP (!) 158/83   Pulse 80   Constitutional:  Alert and oriented, No acute distress. HEENT: [place] AT, moist mucus membranes.  Trachea midline, no masses.   Laboratory Data: Lab Results  Component Value Date   WBC 4.5 03/12/2023   HGB 13.1 03/12/2023   HCT 39.7 03/12/2023   MCV 88.0 03/12/2023   PLT 198 03/12/2023    Lab Results  Component Value Date   CREATININE 0.89 03/12/2023    No results found for: "PSA"  No results found for: "TESTOSTERONE"  No results found for: "HGBA1C"  Urinalysis    Component Value Date/Time   COLORURINE STRAW (A) 03/12/2023 1154   APPEARANCEUR Hazy (A) 04/07/2023 0948   LABSPEC 1.008 03/12/2023 1154   LABSPEC 1.009 04/08/2013 2258   PHURINE 7.0 03/12/2023 1154   GLUCOSEU Negative 04/07/2023 0948   GLUCOSEU Negative 04/08/2013 2258   HGBUR NEGATIVE 03/12/2023 1154   BILIRUBINUR Negative 04/07/2023 0948   BILIRUBINUR Negative  04/08/2013 2258   KETONESUR NEGATIVE 03/12/2023 1154   PROTEINUR Negative 04/07/2023 0948   PROTEINUR NEGATIVE 03/12/2023 1154   NITRITE Negative 04/07/2023 0948   NITRITE NEGATIVE 03/12/2023 1154   LEUKOCYTESUR 1+ (A) 04/07/2023 0948   LEUKOCYTESUR NEGATIVE 03/12/2023 1154   LEUKOCYTESUR Negative 04/08/2013 2258    Pertinent Imaging: Urine reviewed and sent for culture  Assessment & Plan: Stay on trimethoprim for now.  Reassess on oxybutynin ER 10 mg 30 x 11 in 6 weeks.  Eventually likely will need urodynamics.  She understood the treatment plan  1. Mixed stress and urge urinary incontinence  - Urinalysis, Complete   No follow-ups on file.  Martina Sinner, MD  [facility] [address] [place], [place] [address] [phone]

## 2023-06-01 LAB — CULTURE, URINE COMPREHENSIVE

## 2023-06-02 ENCOUNTER — Telehealth: Payer: Self-pay | Admitting: *Deleted

## 2023-06-02 MED ORDER — CIPROFLOXACIN HCL 250 MG PO TABS: 250.0000 mg | ORAL_TABLET | Freq: Two times a day (BID) | ORAL | 0 refills | Status: DC

## 2023-06-02 NOTE — Telephone Encounter (Signed)
Pt aware.   Med erxed.

## 2023-06-02 NOTE — Telephone Encounter (Signed)
-----   Message from Sapulpa A Macdiarmid sent at 06/02/2023  8:12 AM EST ----- Ciprofloxacin 250 mg twice a day for 7 days Then go back on daily trimethoprim ----- Message ----- From: Interface, [facility] Lab Results In Sent: 05/26/2023  11:36 AM EST To: Alfredo Martinez, MD

## 2023-06-02 NOTE — Telephone Encounter (Signed)
.  left message to have patient return my call.  

## 2023-06-27 ENCOUNTER — Ambulatory Visit
Admission: RE | Admit: 2023-06-27 | Discharge: 2023-06-27 | Disposition: A | Discharge status: Home / Self Care | Payer: Medicaid Other | Source: Ambulatory Visit | Attending: Family Medicine | Admitting: Family Medicine

## 2023-06-27 DIAGNOSIS — Z1231 Encounter for screening mammogram for malignant neoplasm of breast: Secondary | ICD-10-CM | POA: Diagnosis present

## 2023-08-04 ENCOUNTER — Ambulatory Visit: Payer: Medicaid Other | Admitting: Urology

## 2023-08-04 VITALS — BP 150/74 | HR 88 | Ht 66.0 in | Wt 292.0 lb

## 2023-08-04 DIAGNOSIS — R310 Gross hematuria: Secondary | ICD-10-CM

## 2023-08-04 DIAGNOSIS — N3946 Mixed incontinence: Secondary | ICD-10-CM | POA: Diagnosis not present

## 2023-08-04 LAB — URINALYSIS, COMPLETE
Bilirubin, UA: NEGATIVE
Glucose, UA: NEGATIVE
Ketones, UA: NEGATIVE
Nitrite, UA: NEGATIVE
Protein,UA: NEGATIVE
RBC, UA: NEGATIVE
Specific Gravity, UA: 1.025 (ref 1.005–1.030)
Urobilinogen, Ur: 0.2 mg/dL (ref 0.2–1.0)
pH, UA: 6 (ref 5.0–7.5)

## 2023-08-04 LAB — MICROSCOPIC EXAMINATION: Epithelial Cells (non renal): >10 /[HPF] — AB (ref 0–10)

## 2023-08-04 MED ORDER — TRIMETHOPRIM 100 MG PO TABS: 100.0000 mg | ORAL_TABLET | Freq: Every day | ORAL | 3 refills | Status: DC

## 2023-08-04 NOTE — Progress Notes (Signed)
08/04/2023 9:31 AM   Nadara Mode 29-Feb-1966 [id number]  Referring provider: Rayetta Humphrey, MD [address] [place],  [place] [address]  Chief Complaint  Patient presents with   Follow-up    HPI: Dr. Marlou Porch 2016: mixed incontinence, possible elevated residual, mild cystocele, previous urodynamics.  Failed Myrbetriq on oxybutynin.  Recommend clean intermittent catheterization   Today I was consulted to assess the patient's urinary incontinence.  She has urge incontinence as a primary complaint.  She also leaks with coughing sneezing bending lifting.  No bedwetting.  She says she soaks 8 or 9 pads a day   Voids at least hourly and cannot hold it for 2 hours.  Gets up at least 5 or 6 times a night and does have ankle edema.  Reports good flow   Some of the details were difficult to ascertain but she thinks she gets at least 4 bladder infections a year with burning and frequency that respond favorably to antibiotics.  She says she recently wiped and saw a little bit of blood.  She has had a hysterectomy   She is on oral hypoglycemics.  She uses a cane because of her swollen left knee and is going to have an MRI   Pelvic examination patient had mild grade 2 hypermobility bladder neck.  No stress incontinence with light cough.  No significant prolapse   Post void residual today was 41 mL     Patient has high-volume mixed incontinence and significant frequency and nocturia.  She may be getting recurrent bladder infections.  She saw a very small amount of blood with wiping in the last few weeks.  Call if culture positive.  Return with hematuria CT scan and cystoscopy.  Called and Vesicare 5 mg 30 x 11 patient understands that we would need to repeat the urodynamics in the future depending on the results.     Patient did not have the CT scan.  Last urine culture was negative Vesicare helped initially a modest amount but the effect wore off Cystoscopy: patient underwent flexible  cystoscopy.  Urine is very cloudy with a lot of white flecks.  Near the floor the bladder there was a little bit of erythema or congested blood vessels submucosal in keeping with acute cystitis.  As the bladder filled the rest of the urothelium was within normal limits.  No carcinoma.  She quit smoking many years ago.   In retrospect, yesterday patient was having some burning and thought she might be infected and did a urine test with her daughter.  She took some AZO standard   Pelvic examination was little bit limited due to recent left knee surgery at she did great.  She had grade 2 hypermobility the bladder neck and no stress incontinence after cystoscopy with a modest cough I reviewed medical record and no recent positive cultures     Patient  has high-volume mixed incontinence.  She just had knee surgery.  It does appear that the patient gets recurrent urinary tract infections.  I called in ciprofloxacin 250 mg twice a day for 7 days.  I will see her back in 6 weeks on trimethoprim suppression therapy.  This will also allow her knee to heal.  I will then be ordering urodynamics likely.  I want to see if her mixed symptoms down regulate some on trimethoprim.  Call if culture    Today Frequency stable.  Last culture negative.  The last 2 urinalysis did not show blood in  the urine No blood in urine today.  Burning is gone since she took the antibiotic.  She did not renew the trimethoprim   Still has incontinence and frequency.  Because of fluid shortage I will not order urodynamics yet and she agreed with my plan.  Stay on trimethoprim and reassess on oxybutynin.  Urodynamics will be ordered in the future  Today Frequency stable.  Clinically not infected but ran out of refills.  Still incontinent and spite of oxybutynin.    PMH: Past Medical History:  Diagnosis Date   Degenerative tear of left medial meniscus    Family history of adverse reaction to anesthesia    a.) PONV in 1st degree  relative (mother)   HTN (hypertension)    Neuromuscular disorder (HCC)    Obesity    Overactive bladder    Pre-diabetes    Primary osteoarthritis of left knee    Sensory urge incontinence    Stress incontinence     Surgical History: Past Surgical History:  Procedure Laterality Date   ABDOMINAL HYSTERECTOMY     COLONOSCOPY WITH PROPOFOL N/A 03/24/2015   Procedure: COLONOSCOPY WITH PROPOFOL;  Surgeon: Elnita Maxwell, MD;  Location: [facility] ENDOSCOPY;  Service: Endoscopy;  Laterality: N/A;   TOTAL KNEE ARTHROPLASTY Left 03/18/2023   Procedure: TOTAL KNEE ARTHROPLASTY;  Surgeon: Christena Flake, MD;  Location: [facility] ORS;  Service: Orthopedics;  Laterality: Left;    Home Medications:  Allergies as of 08/04/2023       Reactions   Pneumococcal 20-val Conj Vacc Other (See Comments)   Had to be hospitalized   Varicella Virus Vaccine Live Other (See Comments)   Facial Numbness and tingling-occurred 11/13/2021   Covid-19 (adenovirus) Vaccine Other (See Comments)   Had to be hospitalized.        Medication List        Accurate as of August 04, 2023  9:31 AM. If you have any questions, ask your nurse or doctor.          acetaminophen 500 MG tablet Commonly known as: TYLENOL Take 1,000 mg by mouth every 6 (six) hours as needed for mild pain or headache.   cefdinir 300 MG capsule Commonly known as: OMNICEF Take 300 mg by mouth 2 (two) times daily.   ciprofloxacin 250 MG tablet Commonly known as: Cipro Take 1 tablet (250 mg total) by mouth 2 (two) times daily.   lisinopril-hydrochlorothiazide 10-12.5 MG tablet Commonly known as: ZESTORETIC Take 1 tablet by mouth daily.   metFORMIN 500 MG tablet Commonly known as: GLUCOPHAGE Take 500 mg by mouth daily.   multivitamin-iron-minerals-folic acid chewable tablet Chew 2 tablets by mouth daily.   oxybutynin 10 MG 24 hr tablet Commonly known as: DITROPAN-XL Take 1 tablet (10 mg total) by mouth at bedtime.   oxyCODONE 5 MG  immediate release tablet Commonly known as: Roxicodone Take 1-2 tablets (5-10 mg total) by mouth every 4 (four) hours as needed for moderate pain or severe pain.   sertraline 25 MG tablet Commonly known as: ZOLOFT Take 25 mg by mouth daily.   trimethoprim 100 MG tablet Commonly known as: TRIMPEX Take 1 tablet (100 mg total) by mouth daily.        Allergies:  Allergies  Allergen Reactions   Pneumococcal 20-Val Conj Vacc Other (See Comments)    Had to be hospitalized   Varicella Virus Vaccine Live Other (See Comments)    Facial Numbness and tingling-occurred 11/13/2021   Covid-19 (Adenovirus) Vaccine Other (See Comments)  Had to be hospitalized.    Family History: Family History  Problem Relation Age of Onset   Cancer Mother    Breast cancer Mother 6   Cancer Father    Cancer Sister    Breast cancer Sister        22    Social History:  reports that she quit smoking about 15 years ago. Her smoking use included cigarettes. She started smoking about 22 years ago. She has been exposed to tobacco smoke. She has never used smokeless tobacco. She reports that she does not drink alcohol and does not use drugs.  ROS:                                        Physical Exam: BP (!) 150/74   Pulse 88   Constitutional:  Alert and oriented, No acute distress. HEENT: [place] AT, moist mucus membranes.  Trachea midline, no masses.   Laboratory Data: Lab Results  Component Value Date   WBC 4.5 03/12/2023   HGB 13.1 03/12/2023   HCT 39.7 03/12/2023   MCV 88.0 03/12/2023   PLT 198 03/12/2023    Lab Results  Component Value Date   CREATININE 0.89 03/12/2023    No results found for: "PSA"  No results found for: "TESTOSTERONE"  No results found for: "HGBA1C"  Urinalysis    Component Value Date/Time   COLORURINE STRAW (A) 03/12/2023 1154   APPEARANCEUR Clear 05/26/2023 1006   LABSPEC 1.008 03/12/2023 1154   LABSPEC 1.009 04/08/2013 2258   PHURINE  7.0 03/12/2023 1154   GLUCOSEU Negative 05/26/2023 1006   GLUCOSEU Negative 04/08/2013 2258   HGBUR NEGATIVE 03/12/2023 1154   BILIRUBINUR Negative 05/26/2023 1006   BILIRUBINUR Negative 04/08/2013 2258   KETONESUR NEGATIVE 03/12/2023 1154   PROTEINUR Negative 05/26/2023 1006   PROTEINUR NEGATIVE 03/12/2023 1154   NITRITE Positive (A) 05/26/2023 1006   NITRITE NEGATIVE 03/12/2023 1154   LEUKOCYTESUR Negative 05/26/2023 1006   LEUKOCYTESUR NEGATIVE 03/12/2023 1154   LEUKOCYTESUR Negative 04/08/2013 2258    Pertinent Imaging: Urine negative but sent for culture  Assessment & Plan: Reordered trimethoprim 100 mg 90 x 3.  Call if culture positive.  This would not represent a breakthrough infection.  Order urodynamics and proceed accordingly  There are no diagnoses linked to this encounter.  No follow-ups on file.  Martina Sinner, MD  [facility] [address], [address] [place], [place] [address] [phone]

## 2023-08-04 NOTE — Patient Instructions (Signed)
Urodynamic testing, also known as a urodynamic study (UDS), is a series of tests that evaluate how well the bladder, urethra, and sphincters store and release urine

## 2023-08-06 ENCOUNTER — Telehealth: Payer: Self-pay | Admitting: Urology

## 2023-08-06 DIAGNOSIS — R310 Gross hematuria: Secondary | ICD-10-CM

## 2023-08-06 DIAGNOSIS — N3946 Mixed incontinence: Secondary | ICD-10-CM

## 2023-08-06 NOTE — Telephone Encounter (Signed)
Left message on VM that referral was entered for [facility] urology

## 2023-08-06 NOTE — Telephone Encounter (Signed)
Pt called office to let us know she can't have UDS done at [facility] because they don't take [place] Medicaid.  She would like an outgoing referral sent to [facility] (preferably [place]) if they do UDS.

## 2023-08-06 NOTE — Telephone Encounter (Signed)
Pt returned call and I read message.  She asked if it was [facility] or [place].

## 2023-08-07 LAB — CULTURE, URINE COMPREHENSIVE

## 2023-09-27 ENCOUNTER — Other Ambulatory Visit: Payer: Self-pay

## 2023-09-27 DIAGNOSIS — R600 Localized edema: Secondary | ICD-10-CM | POA: Insufficient documentation

## 2023-09-27 DIAGNOSIS — M7989 Other specified soft tissue disorders: Secondary | ICD-10-CM | POA: Diagnosis present

## 2023-09-27 LAB — CBC
HCT: 41.8 % (ref 36.0–46.0)
Hemoglobin: 13.6 g/dL (ref 12.0–15.0)
MCH: 29.1 pg (ref 26.0–34.0)
MCHC: 32.5 g/dL (ref 30.0–36.0)
MCV: 89.5 fL (ref 80.0–100.0)
Platelets: 215 10*3/uL (ref 150–400)
RBC: 4.67 MIL/uL (ref 3.87–5.11)
RDW: 14.1 % (ref 11.5–15.5)
WBC: 5.7 10*3/uL (ref 4.0–10.5)
nRBC: 0 % (ref 0.0–0.2)

## 2023-09-27 LAB — BASIC METABOLIC PANEL
Anion gap: 4 — ABNORMAL LOW (ref 5–15)
BUN: 17 mg/dL (ref 6–20)
CO2: 26 mmol/L (ref 22–32)
Calcium: 8.6 mg/dL — ABNORMAL LOW (ref 8.9–10.3)
Chloride: 107 mmol/L (ref 98–111)
Creatinine, Ser: 0.88 mg/dL (ref 0.44–1.00)
GFR, Estimated: >60 mL/min (ref >60)
Glucose, Bld: 127 mg/dL — ABNORMAL HIGH (ref 70–99)
Potassium: 3.4 mmol/L — ABNORMAL LOW (ref 3.5–5.1)
Sodium: 137 mmol/L (ref 135–145)

## 2023-09-27 NOTE — ED Triage Notes (Signed)
 Pt to ED by EMS from home with c/o bilateral leg swelling getting progressively worse over the past week. Pt states her legs are now oozing when you push on them. Redness noted to both legs with associated swelling. Pt also c/o right sided flank pain and painful urination. Arrives A+O, VSS, NADN.

## 2023-09-28 ENCOUNTER — Emergency Department
Admission: EM | Admit: 2023-09-28 | Discharge: 2023-09-28 | Disposition: A | Discharge status: Home / Self Care | Attending: Emergency Medicine | Admitting: Emergency Medicine

## 2023-09-28 DIAGNOSIS — R600 Localized edema: Secondary | ICD-10-CM

## 2023-09-28 LAB — URINALYSIS, ROUTINE W REFLEX MICROSCOPIC
Bacteria, UA: NONE SEEN
Bilirubin Urine: NEGATIVE
Glucose, UA: NEGATIVE mg/dL
Hgb urine dipstick: NEGATIVE
Ketones, ur: NEGATIVE mg/dL
Nitrite: NEGATIVE
Protein, ur: NEGATIVE mg/dL
Specific Gravity, Urine: 1.019 (ref 1.005–1.030)
pH: 5 (ref 5.0–8.0)

## 2023-09-28 LAB — BRAIN NATRIURETIC PEPTIDE: B Natriuretic Peptide: 28.7 pg/mL (ref 0.0–100.0)

## 2023-09-28 MED ORDER — FUROSEMIDE 40 MG PO TABS
20.0000 mg | ORAL_TABLET | Freq: Once | ORAL | Status: AC
  Administered 2023-09-28: 20 mg via ORAL
  Filled 2023-09-28: qty 1

## 2023-09-28 NOTE — Discharge Instructions (Signed)
You were seen today in the Emergency Department (ED) for swelling in your legs.  As we discussed, your workup today was reassuring.  This condition is known as "peripheral edema", and it is not dangerous although it can be uncomfortable.  Fortunately at this time it appears that you have no emergent medical condition at this time and that you are safe to go home and follow up as recommended in this paperwork.  We recommend that you keep your legs elevated as much as possible, particularly when you are sitting down or even lying down at night (use a pillow or two under your lower legs).  Consider buying compression stockings at the pharmacy or using Ace (elastic) wraps on your legs they can provide firm but not tight pressure to help reduce the swelling.  Please continue taking your regular medications unless told otherwise in these documents.  Please take any new medications that may have been prescribed during this visit.  Follow up with the providers listed above.  Please return immediately to the Emergency Department if you develop any new or worsening symptoms that concern you.  

## 2023-09-28 NOTE — ED Provider Notes (Signed)
 [facility] Provider Note    Event Date/Time   First MD Initiated Contact with Patient 09/28/23 [phone]     (approximate)   History   Leg Swelling   HPI Sherry Hernandez is a 58 y.o. female whose medical history includes obesity and hypertension as well as history of GI issues including intractable nausea and vomiting.  She presents for evaluation of about a week of gradually worsening bilateral lower extremity swelling.  She says she has been eating a lot of salty food lately "because she wants to".  She has been taking her blood pressure medicine which is a combination pill with both lisinopril and hydrochlorothiazide, and she has had no recent medication changes or increase in her medication.  She has had no immobilizations or surgeries recently.  Both legs are affected.  They have become somewhat painful due to the pressure but she feels better after she keeps her legs propped up.  She has been trying compression stockings.  Sometimes the swelling is bad enough that her legs are leaking fluid.  She denies fever, chest pain, shortness of breath, nausea, vomiting.     Physical Exam   Triage Vital Signs: ED Triage Vitals  Encounter Vitals Group     BP 09/27/23 2326 (!) 162/70     Systolic BP Percentile --      Diastolic BP Percentile --      Pulse Rate 09/27/23 2326 79     Resp 09/27/23 2326 18     Temp 09/27/23 2326 97.6 F (36.4 C)     Temp Source 09/27/23 2326 Oral     SpO2 09/27/23 2326 100 %     Weight 09/27/23 2320 129.7 kg (286 lb)     Height 09/27/23 2320 1.676 m (5\' 6" )     Head Circumference --      Peak Flow --      Pain Score 09/27/23 2319 10     Pain Loc --      Pain Education --      Exclude from Growth Chart --     Most recent vital signs: Vitals:   09/27/23 2326  BP: (!) 162/70  Pulse: 79  Resp: 18  Temp: 97.6 F (36.4 C)  SpO2: 100%    General: Awake, no distress.  Generally well-appearing despite chronic medical  issues. CV:  Good peripheral perfusion.  Regular rate and rhythm. Resp:  Normal effort. Speaking easily and comfortably, no accessory muscle usage nor intercostal retractions.   Abd:  No distention.  Obese, no tenderness to palpation. Other:  Bilateral lower extremity swelling that does not extend up to the knees.  Feet and lower legs are involved with 1-2+ pitting edema and some mild erythema but no evidence of infection, no weeping at the moment, no wounds nor evidence of purulence.  Consistent with peripheral edema with a degree of venous stasis.   ED Results / Procedures / Treatments   Labs (all labs ordered are listed, but only abnormal results are displayed) Labs Reviewed  URINALYSIS, ROUTINE W REFLEX MICROSCOPIC - Abnormal; Notable for the following components:      Result Value   Color, Urine YELLOW (*)    APPearance CLEAR (*)    Leukocytes,Ua TRACE (*)    All other components within normal limits  BASIC METABOLIC PANEL - Abnormal; Notable for the following components:   Potassium 3.4 (*)    Glucose, Bld 127 (*)    Calcium 8.6 (*)  Anion gap 4 (*)    All other components within normal limits  CBC  BRAIN NATRIURETIC PEPTIDE     PROCEDURES:  Critical Care performed: No  Procedures    IMPRESSION / MDM / ASSESSMENT AND PLAN / ED COURSE  I reviewed the triage vital signs and the nursing notes.                              Differential diagnosis includes, but is not limited to, peripheral edema, dietary indiscretions, CHF, renal failure, electrolyte abnormality.  Patient's presentation is most consistent with acute presentation with potential threat to life or bodily function.  Labs/studies ordered: BMP, CBC, BMP, urinalysis  Interventions/Medications given:  Medications  furosemide (LASIX) tablet 20 mg (20 mg Oral Given 09/28/23 0334)    (Note:  hospital course my include additional interventions and/or labs/studies not listed above.)   Vital signs stable  other than hypertension.  I suspect dietary indiscretion is the primary reason for her worsening edema over the last week.  She confirmed 2 different times that she has had many dietary indiscretions including very salty food.  I explained to her that I am not comfortable starting her on Lasix because this is a primary care issue and needs to be followed carefully as an outpatient, but I think it is reasonable to give her a single dose of Lasix to promote diuresis in the short-term until she can call her PCP for follow-up appointment.  I encouraged her to try to stick to an appropriate diet as she has been recommended in the past and to use conservative measures such as leg elevation, compression stockings, and her regular blood pressure medicine.  I have no concern for DVT clinically and based on H&P, and her Wells score for DVT is -2.  There is also no evidence of infection at this time and her lab work is all within normal limits including her BNP.  She understands and agrees with the plan.  I gave my usual follow-up recommendations and return precautions      FINAL CLINICAL IMPRESSION(S) / ED DIAGNOSES   Final diagnoses:  Bilateral lower extremity edema     Rx / DC Orders   ED Discharge Orders     None        Note:  This document was prepared using Dragon voice recognition software and may include unintentional dictation errors.   Loleta Rose, MD 09/28/23 [phone]

## 2023-09-29 ENCOUNTER — Ambulatory Visit: Payer: Self-pay | Admitting: Urology

## 2023-10-03 ENCOUNTER — Emergency Department

## 2023-10-03 ENCOUNTER — Emergency Department
Admission: EM | Admit: 2023-10-03 | Discharge: 2023-10-03 | Disposition: A | Discharge status: Home / Self Care | Attending: Emergency Medicine | Admitting: Emergency Medicine

## 2023-10-03 ENCOUNTER — Other Ambulatory Visit: Payer: Self-pay

## 2023-10-03 DIAGNOSIS — R0781 Pleurodynia: Secondary | ICD-10-CM | POA: Diagnosis not present

## 2023-10-03 DIAGNOSIS — W19XXXA Unspecified fall, initial encounter: Secondary | ICD-10-CM

## 2023-10-03 DIAGNOSIS — W01198A Fall on same level from slipping, tripping and stumbling with subsequent striking against other object, initial encounter: Secondary | ICD-10-CM | POA: Diagnosis not present

## 2023-10-03 DIAGNOSIS — M545 Low back pain, unspecified: Secondary | ICD-10-CM | POA: Diagnosis present

## 2023-10-03 MED ORDER — ACETAMINOPHEN 325 MG PO TABS
650.0000 mg | ORAL_TABLET | Freq: Once | ORAL | Status: AC
  Administered 2023-10-03: 650 mg via ORAL
  Filled 2023-10-03: qty 2

## 2023-10-03 NOTE — Discharge Instructions (Signed)
 Please take Tylenol 650 mg 4 times daily for pain.  Follow-up with primary care in the next few days for recheck.  Return here for new or worse symptoms.  Especially follow-up for lower extremity numbness, weakness, changes in your ability to pee or poop.

## 2023-10-03 NOTE — ED Provider Notes (Signed)
 [place] EMERGENCY DEPARTMENT AT [facility] Provider Note   CSN: [id number] Arrival date & time: 10/03/23  1056     History  Chief Complaint  Patient presents with   Marletta Lor    Sherry Hernandez is a 58 y.o. female.  Patient here for fall.  Had a fall earlier today while outside on the porch tripped on the [place] hit head.  Does have headache, nausea.  Admits to history of intracranial hemorrhage several years ago.  Denies any numbness, weakness.  Also has low back pain and left posterior rib pain.  No extremity pain or injuries.  Denies bleeding.   Fall Associated symptoms include headaches. Pertinent negatives include no abdominal pain.       Home Medications Prior to Admission medications   Medication Sig Start Date End Date Taking? Authorizing Provider  acetaminophen (TYLENOL) 500 MG tablet Take 1,000 mg by mouth every 6 (six) hours as needed for mild pain or headache.    [provider]  lisinopril-hydrochlorothiazide (PRINZIDE,ZESTORETIC) 10-12.5 MG per tablet Take 1 tablet by mouth daily.    [provider]  metFORMIN (GLUCOPHAGE) 500 MG tablet Take 500 mg by mouth daily.    [provider]  multivitamin-iron-minerals-folic acid (CENTRUM) chewable tablet Chew 2 tablets by mouth daily.     [provider]  oxyCODONE (ROXICODONE) 5 MG immediate release tablet Take 1-2 tablets (5-10 mg total) by mouth every 4 (four) hours as needed for moderate pain or severe pain. 03/18/23   Poggi, Excell Seltzer, MD  sertraline (ZOLOFT) 25 MG tablet Take 25 mg by mouth daily. 05/14/23   [provider]  trimethoprim (TRIMPEX) 100 MG tablet Take 1 tablet (100 mg total) by mouth daily. 08/04/23   Alfredo Martinez, MD      Allergies    Pneumococcal 20-val conj vacc, Varicella virus vaccine live, and Covid-19 (adenovirus) vaccine    Review of Systems   Review of Systems  Constitutional:  Negative for chills and fever.  Gastrointestinal:  Positive  for nausea. Negative for abdominal pain.  Neurological:  Positive for headaches. Negative for weakness and numbness.    Physical Exam Updated Vital Signs BP (!) 159/68 (BP Location: Left Arm)   Pulse 95   Temp 98 F (36.7 C)   Resp 16   Wt 129.7 kg   SpO2 95%   BMI 46.15 kg/m  Physical Exam Vitals reviewed.  Constitutional:      Appearance: Normal appearance.  HENT:     Head: Normocephalic and atraumatic.     Nose: Nose normal.  Cardiovascular:     Rate and Rhythm: Normal rate.     Pulses: Normal pulses.  Pulmonary:     Effort: Pulmonary effort is normal. No respiratory distress.     Comments: Mild left lower posterior rib tenderness. Abdominal:     Tenderness: There is no abdominal tenderness.  Musculoskeletal:        General: No swelling or tenderness.     Cervical back: Normal range of motion.     Comments: No thoracic or lumbar bony tenderness.  Neurological:     Mental Status: She is alert and oriented to person, place, and time. Mental status is at baseline.     Cranial Nerves: No cranial nerve deficit.     Sensory: No sensory deficit.     Motor: No weakness.  Psychiatric:        Mood and Affect: Mood normal.        Behavior: Behavior  normal.     ED Results / Procedures / Treatments   Labs (all labs ordered are listed, but only abnormal results are displayed) Labs Reviewed - No data to display  EKG None  Radiology DG Lumbar Spine Complete Result Date: 10/03/2023 CLINICAL DATA:  Lower back pain after fall today. EXAM: LUMBAR SPINE - COMPLETE 4+ VIEW COMPARISON:  None Available. FINDINGS: There is no evidence of lumbar spine fracture. Alignment is normal. Mild degenerative disc disease is noted at L1-2, L2-3, L3-4 and L5-S1. IMPRESSION: Multilevel degenerative disc disease.  No acute abnormality seen. Electronically Signed   By: Lupita Raider M.D.   On: 10/03/2023 13:43   DG Chest 2 View Result Date: 10/03/2023 CLINICAL DATA:  Fall today. EXAM: CHEST -  2 VIEW COMPARISON:  August 14, 2022. FINDINGS: The heart size and mediastinal contours are within normal limits. Both lungs are clear. The visualized skeletal structures are unremarkable. IMPRESSION: No active cardiopulmonary disease. Electronically Signed   By: Lupita Raider M.D.   On: 10/03/2023 13:41   CT Head Wo Contrast Result Date: 10/03/2023 CLINICAL DATA:  Head trauma, intracranial arterial injury suspected. Fall with head strike. EXAM: CT HEAD WITHOUT CONTRAST TECHNIQUE: Contiguous axial images were obtained from the base of the skull through the vertex without intravenous contrast. RADIATION DOSE REDUCTION: This exam was performed according to the departmental dose-optimization program which includes automated exposure control, adjustment of the mA and/or kV according to patient size and/or use of iterative reconstruction technique. COMPARISON:  Head CT 07/10/2017. FINDINGS: Brain: No acute intracranial hemorrhage. Gray-white differentiation is preserved. No hydrocephalus or extra-axial collection. No mass effect or midline shift. Vascular: No hyperdense vessel or unexpected calcification. Skull: No calvarial fracture or suspicious bone lesion. Skull base is unremarkable. Sinuses/Orbits: No acute finding. Other: None. IMPRESSION: No evidence of acute intracranial injury. Electronically Signed   By: Orvan Falconer M.D.   On: 10/03/2023 12:12    Procedures Procedures    Medications Ordered in ED Medications  acetaminophen (TYLENOL) tablet 650 mg (has no administration in time range)    ED Course/ Medical Decision Making/ A&P                                 Medical Decision Making Patient here for fall mechanical positive head injury.  No neurodeficits no concerning symptoms other than nausea and she does have a history of ICH thus will obtain head CT as well as x-ray of lumbar and chest.  CT is reassuring.  No intercranial abnormalities.  Chest x-ray clear.  Lumbar x-ray is nonacute  does show degenerative changes.  Recommend Tylenol, rest.  Follow-up with primary.  Given return precautions for new or worse symptoms including red flag symptoms.  Amount and/or Complexity of Data Reviewed Radiology: ordered.  Risk OTC drugs.           Final Clinical Impression(s) / ED Diagnoses Final diagnoses:  Fall, initial encounter  Acute low back pain, unspecified back pain laterality, unspecified whether sciatica present    Rx / DC Orders ED Discharge Orders     None         Christen Bame, PA-C 10/03/23 1400    Janith Lima, MD 10/03/23 1400

## 2023-10-03 NOTE — ED Triage Notes (Signed)
 Fall this morning.  Tripped on the cat.  Hit head and left lower back pain.  No LOC

## 2023-10-08 ENCOUNTER — Encounter

## 2023-10-20 ENCOUNTER — Ambulatory Visit (INDEPENDENT_AMBULATORY_CARE_PROVIDER_SITE_OTHER): Payer: Medicaid Other | Admitting: Urology

## 2023-10-20 VITALS — BP 141/60 | HR 81 | Ht 66.0 in | Wt 307.0 lb

## 2023-10-20 DIAGNOSIS — R310 Gross hematuria: Secondary | ICD-10-CM | POA: Diagnosis not present

## 2023-10-20 DIAGNOSIS — N3946 Mixed incontinence: Secondary | ICD-10-CM

## 2023-10-20 LAB — URINALYSIS, COMPLETE
Bilirubin, UA: NEGATIVE
Glucose, UA: NEGATIVE
Ketones, UA: NEGATIVE
Leukocytes,UA: NEGATIVE
Nitrite, UA: NEGATIVE
Protein,UA: NEGATIVE
RBC, UA: NEGATIVE
Specific Gravity, UA: <1.005 — ABNORMAL LOW (ref 1.005–1.030)
Urobilinogen, Ur: 0.2 mg/dL (ref 0.2–1.0)
pH, UA: 5.5 (ref 5.0–7.5)

## 2023-10-20 LAB — MICROSCOPIC EXAMINATION: Bacteria, UA: NONE SEEN

## 2023-10-20 MED ORDER — GEMTESA 75 MG PO TABS: 75.0000 mg | ORAL_TABLET | Freq: Every day | ORAL | 11 refills | Status: DC

## 2023-10-20 MED ORDER — GEMTESA 75 MG PO TABS: 75.0000 mg | ORAL_TABLET | Freq: Every day | ORAL | Status: DC

## 2023-10-20 NOTE — Progress Notes (Signed)
 10/20/2023 10:35 AM   Nadara Mode 10/12/1965 [id number]  Referring provider: Rayetta Humphrey, MD [address] [place],  [place] [address]  Chief Complaint  Patient presents with   Medical Management of Chronic Issues    HPI: Dr. Marlou Porch 2016: mixed incontinence, possible elevated residual, mild cystocele, previous urodynamics.  Failed Myrbetriq on oxybutynin.  Recommend clean intermittent catheterization   Today I was consulted to assess the patient's urinary incontinence.  She has urge incontinence as a primary complaint.  She also leaks with coughing sneezing bending lifting.  No bedwetting.  She says she soaks 8 or 9 pads a day   Voids at least hourly and cannot hold it for 2 hours.  Gets up at least 5 or 6 times a night and does have ankle edema.  Reports good flow   Some of the details were difficult to ascertain but she thinks she gets at least 4 bladder infections a year with burning and frequency that respond favorably to antibiotics.  She says she recently wiped and saw a little bit of blood.  She has had a hysterectomy   She is on oral hypoglycemics.  She uses a cane because of her swollen left knee and is going to have an MRI   Pelvic examination patient had mild grade 2 hypermobility bladder neck.  No stress incontinence with light cough.  No significant prolapse   Post void residual today was 41 mL     Patient has high-volume mixed incontinence and significant frequency and nocturia.  She may be getting recurrent bladder infections.  She saw a very small amount of blood with wiping in the last few weeks.  Call if culture positive.  Return with hematuria CT scan and cystoscopy.  Called and Vesicare 5 mg 30 x 11 patient understands that we would need to repeat the urodynamics in the future depending on the results.     Patient did not have the CT scan.  Last urine culture was negative Vesicare helped initially a modest amount but the effect wore off Cystoscopy:  patient underwent flexible cystoscopy.  Urine is very cloudy with a lot of white flecks.  Near the floor the bladder there was a little bit of erythema or congested blood vessels submucosal in keeping with acute cystitis.  As the bladder filled the rest of the urothelium was within normal limits.  No carcinoma.  She quit smoking many years ago.   In retrospect, yesterday patient was having some burning and thought she might be infected and did a urine test with her daughter.  She took some AZO standard   Pelvic examination was little bit limited due to recent left knee surgery at she did great.  She had grade 2 hypermobility the bladder neck and no stress incontinence after cystoscopy with a modest cough I reviewed medical record and no recent positive cultures     Patient  has high-volume mixed incontinence.  She just had knee surgery.  It does appear that the patient gets recurrent urinary tract infections.  I called in ciprofloxacin 250 mg twice a day for 7 days.  I will see her back in 6 weeks on trimethoprim suppression therapy.  This will also allow her knee to heal.  I will then be ordering urodynamics likely.  I want to see if her mixed symptoms down regulate some on trimethoprim.  Call if culture    Today Frequency stable.  Last culture negative.  The last 2 urinalysis did  not show blood in the urine No blood in urine today.  Burning is gone since she took the antibiotic.  She did not renew the trimethoprim   Still has incontinence and frequency.  Because of fluid shortage I will not order urodynamics yet and she agreed with my plan.  Stay on trimethoprim and reassess on oxybutynin.  Urodynamics will be ordered in the future   Today Frequency stable.  Clinically not infected but ran out of refills.  Still incontinent and spite of oxybutynin.  Reordered trimethoprim 100 mg 90 x 3.  Call if culture positive.  This would not represent a breakthrough infection.  Order urodynamics and proceed  accordingly  Today Once again patient stopped the daily trimethoprim.  She has had a CT hematuria workup ordered in the past but it was not done but it was ordered.  Urine culture positive in November 2024 but negative in January 2025 last visit  She says she still has frequency and incontinence.\ Today she reports urge incontinence and foot on the floor syndrome and no bedwetting  Patient had [facility]rod[facility] at [facility].  Maximum bladder capacity was 303 mL.  She had increased bladder sensation.  Compliance was normal.  She had an overactive bladder early on in bladder filling she did not leak.  At 150 mL her Valsalva leak point pressure was 155 cm water.  At 300 mL she leaked a drop at 275 cm of water.  She may have had a leak point pressure as low as 132 cm of water.  Maximum voiding pressure was 18 cm of water.  Flow rate was 13 mL/s.  She voided 114 mL and residual was 190 mL.  EMG activity normal.  She was catheterized at the beginning of the study for 30 mL       PMH: Past Medical History:  Diagnosis Date   Degenerative tear of left medial meniscus    Family history of adverse reaction to anesthesia    a.) PONV in 1st degree relative (mother)   HTN (hypertension)    Neuromuscular disorder (HCC)    Obesity    Overactive bladder    Pre-diabetes    Primary osteoarthritis of left knee    Sensory urge incontinence    Stress incontinence     Surgical History: Past Surgical History:  Procedure Laterality Date   ABDOMINAL HYSTERECTOMY     COLONOSCOPY WITH PROPOFOL N/A 03/24/2015   Procedure: COLONOSCOPY WITH PROPOFOL;  Surgeon: Luella Sager, MD;  [facility] END[facility]COPY;  Service: Endoscopy;  Laterality: N/A;   TOTAL KNEE ARTHROPLASTY Left 03/18/2023   Procedure: TOTAL KNEE ARTHROPLASTY;  Surgeon: Elner Hahn, MD;  Location: [facility] ORS;  Service: Orthopedics;  Laterality: Left;    Home Medications:  Allergies as of 10/20/2023       Reactions   Pneumococcal 20-val  Conj Vacc Other (See Comments)   Had to be hospitalized   Varicella Virus Vaccine Live Other (See Comments)   Facial Numbness and tingling-occurred 11/13/2021   Covid-19 (adenovirus) Vaccine Other (See Comments)   Had to be hospitalized.        Medication List        Accurate as of October 20, 2023 10:35 AM. If you have any questions, ask your nurse or doctor.          acetaminophen 500 MG tablet Commonly known as: TYLENOL Take 1,000 mg by mouth every 6 (six) hours as needed for mild pain or headache.   lisinopril-hydrochlorothiazide  10-12.5 MG tablet Commonly known as: ZESTORETIC Take 1 tablet by mouth daily.   metFORMIN 500 MG tablet Commonly known as: GLUCOPHAGE Take 500 mg by mouth daily.   multivitamin-iron-minerals-folic acid chewable tablet Chew 2 tablets by mouth daily.   oxyCODONE 5 MG immediate release tablet Commonly known as: Roxicodone Take 1-2 tablets (5-10 mg total) by mouth every 4 (four) hours as needed for moderate pain or severe pain.   sertraline 25 MG tablet Commonly known as: ZOLOFT Take 25 mg by mouth daily.   trimethoprim 100 MG tablet Commonly known as: TRIMPEX Take 1 tablet (100 mg total) by mouth daily.        Allergies:  Allergies  Allergen Reactions   Pneumococcal 20-Val Conj Vacc Other (See Comments)    Had to be hospitalized   Varicella Virus Vaccine Live Other (See Comments)    Facial Numbness and tingling-occurred 11/13/2021   Covid-19 (Adenovirus) Vaccine Other (See Comments)    Had to be hospitalized.    Family History: Family History  Problem Relation Age of Onset   Cancer Mother    Breast cancer Mother 34   Cancer Father    Cancer Sister    Breast cancer Sister        42    Social History:  reports that she quit smoking about 16 years ago. Her smoking use included cigarettes. She started smoking about 23 years ago. She has been exposed to tobacco smoke. She has never used smokeless tobacco. She reports that she  does not drink alcohol and does not use drugs.  ROS:                                        Physical Exam: BP (!) 141/60   Pulse 81   Ht 5\' 6"  (1.676 m)   Wt (!) 139.3 kg   BMI 49.55 kg/m   Constitutional:  Alert and oriented, No acute distress. HEENT: [place] AT, moist mucus membranes.  Trachea midline, no masses.   Laboratory Data: Lab Results  Component Value Date   WBC 5.7 09/27/2023   HGB 13.6 09/27/2023   HCT 41.8 09/27/2023   MCV 89.5 09/27/2023   PLT 215 09/27/2023    Lab Results  Component Value Date   CREATININE 0.88 09/27/2023    No results found for: "PSA"  No results found for: "TESTOSTERONE"  No results found for: "HGBA1C"  Urinalysis    Component Value Date/Time   COLORURINE YELLOW (A) 09/27/2023 2322   APPEARANCEUR CLEAR (A) 09/27/2023 2322   APPEARANCEUR Clear 08/04/2023 0934   LABSPEC 1.019 09/27/2023 2322   LABSPEC 1.009 04/08/2013 2258   PHURINE 5.0 09/27/2023 2322   GLUCOSEU NEGATIVE 09/27/2023 2322   GLUCOSEU Negative 04/08/2013 2258   HGBUR NEGATIVE 09/27/2023 2322   BILIRUBINUR NEGATIVE 09/27/2023 2322   BILIRUBINUR Negative 08/04/2023 0934   BILIRUBINUR Negative 04/08/2013 2258   KETONESUR NEGATIVE 09/27/2023 2322   PROTEINUR NEGATIVE 09/27/2023 2322   NITRITE NEGATIVE 09/27/2023 2322   LEUKOCYTESUR TRACE (A) 09/27/2023 2322   LEUKOCYTESUR Negative 04/08/2013 2258    Pertinent Imaging:   Assessment & Plan: Patient has mixed incontinence but primarily an overactive bladder.  She leaked a few drops at high leak point pressures.  Call if culture positive.  Reassess on Gemtesa samples and prescription and offer third line therapies if she does not reach her treatment goal.  She has not  been compliant with once a day antibiotics but many of her cultures have been negative.  I will leave her off prophylaxis for now.  She fell and may have had a crushed vertebrae treated with watchful waiting  1. 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 Gross hematuria  - Urinalysis, Complete   No follow-ups on file.  Devorah Fonder, MD  [facility] [address], [address] [place], [place] [address] [phone]

## 2023-10-23 LAB — CULTURE, URINE COMPREHENSIVE

## 2023-10-27 ENCOUNTER — Other Ambulatory Visit

## 2023-10-27 ENCOUNTER — Other Ambulatory Visit: Payer: Self-pay | Admitting: Orthopedic Surgery

## 2023-10-27 ENCOUNTER — Ambulatory Visit
Admission: RE | Admit: 2023-10-27 | Discharge: 2023-10-27 | Disposition: A | Discharge status: Home / Self Care | Source: Ambulatory Visit | Attending: Orthopedic Surgery | Admitting: Orthopedic Surgery

## 2023-10-27 DIAGNOSIS — S22000A Wedge compression fracture of unspecified thoracic vertebra, initial encounter for closed fracture: Secondary | ICD-10-CM

## 2023-11-04 ENCOUNTER — Encounter: Payer: Self-pay | Admitting: Urology

## 2023-11-04 ENCOUNTER — Ambulatory Visit (INDEPENDENT_AMBULATORY_CARE_PROVIDER_SITE_OTHER): Admitting: Urology

## 2023-11-04 VITALS — BP 136/83 | HR 80 | Ht 66.0 in | Wt 295.0 lb

## 2023-11-04 DIAGNOSIS — R3129 Other microscopic hematuria: Secondary | ICD-10-CM | POA: Diagnosis not present

## 2023-11-04 DIAGNOSIS — R3989 Other symptoms and signs involving the genitourinary system: Secondary | ICD-10-CM

## 2023-11-04 DIAGNOSIS — N952 Postmenopausal atrophic vaginitis: Secondary | ICD-10-CM | POA: Diagnosis not present

## 2023-11-04 LAB — URINALYSIS, COMPLETE
Bilirubin, UA: NEGATIVE
Glucose, UA: NEGATIVE
Ketones, UA: NEGATIVE
Nitrite, UA: NEGATIVE
Protein,UA: NEGATIVE
Specific Gravity, UA: 1.025 (ref 1.005–1.030)
Urobilinogen, Ur: 0.2 mg/dL (ref 0.2–1.0)
pH, UA: 5.5 (ref 5.0–7.5)

## 2023-11-04 LAB — MICROSCOPIC EXAMINATION: WBC, UA: >30 /HPF — AB (ref 0–5)

## 2023-11-04 LAB — BLADDER SCAN AMB NON-IMAGING

## 2023-11-04 MED ORDER — CEFUROXIME AXETIL 500 MG PO TABS: 500.0000 mg | ORAL_TABLET | Freq: Two times a day (BID) | ORAL | 0 refills | Status: DC

## 2023-11-04 NOTE — Progress Notes (Signed)
 11/04/2023 2:13 PM   Sherry Hernandez 21-Oct-1965 [id number]  Referring provider: Alexander Anes, MD [address] [place],  [place] [address]  Urological history: 1.  Mixed incontinence  2.  Mild cystocele  Chief Complaint  Patient presents with   Recurrent UTI    HPI: Sherry Hernandez is a 58 y.o. woman who presents today for a same-day appointment for possible UTI.  Previous records reviewed.   She is followed by Dr. MacDiarmid for her incontinence.  Today's UA is yellow cloudy, specific gravity 1.025, 2+ heme, pH 5.5, 2+ leukocytes, greater than 30 WBCs, 11-30 RBCs, 0-10 epithelial cells and many bacteria.  PVR 23 mL  She started experiencing dysuria yesterday.  It is very intense.  Patient denies any modifying or aggravating factors.  Patient denies any recent UTI's, gross hematuria, dysuria or suprapubic/flank pain.  Patient denies any fevers, chills, nausea or vomiting.    PMH: Past Medical History:  Diagnosis Date   Degenerative tear of left medial meniscus    Family history of adverse reaction to anesthesia    a.) PONV in 1st degree relative (mother)   HTN (hypertension)    Neuromuscular disorder (HCC)    Obesity    Overactive bladder    Pre-diabetes    Primary osteoarthritis of left knee    Sensory urge incontinence    Stress incontinence     Surgical History: Past Surgical History:  Procedure Laterality Date   ABDOMINAL HYSTERECTOMY     COLONOSCOPY WITH PROPOFOL  N/A 03/24/2015   Procedure: COLONOSCOPY WITH PROPOFOL ;  Surgeon: Luella Sager, MD;  Location: [facility] ENDOSCOPY;  Service: Endoscopy;  Laterality: N/A;   TOTAL KNEE ARTHROPLASTY Left 03/18/2023   Procedure: TOTAL KNEE ARTHROPLASTY;  Surgeon: Elner Hahn, MD;  Location: [facility] ORS;  Service: Orthopedics;  Laterality: Left;    Home Medications:  Allergies as of 11/04/2023       Reactions   Pneumococcal 20-val Conj Vacc Other (See Comments)   Had to be hospitalized   Varicella  Virus Vaccine Live Other (See Comments)   Facial Numbness and tingling-occurred 11/13/2021   Covid-19 (adenovirus) Vaccine Other (See Comments)   Had to be hospitalized.        Medication List        Accurate as of November 04, 2023  2:13 PM. If you have any questions, ask your nurse or doctor.          STOP taking these medications    sertraline 25 MG tablet Commonly known as: ZOLOFT       TAKE these medications    acetaminophen  500 MG tablet Commonly known as: TYLENOL  Take 1,000 mg by mouth every 6 (six) hours as needed for mild pain or headache.   cefUROXime 500 MG tablet Commonly known as: CEFTIN Take 1 tablet (500 mg total) by mouth 2 (two) times daily with a meal.   Gemtesa  75 MG Tabs Generic drug: Vibegron  Take 1 tablet (75 mg total) by mouth daily. What changed: Another medication with the same name was removed. Continue taking this medication, and follow the directions you see here.   lisinopril -hydrochlorothiazide 10-12.5 MG tablet Commonly known as: ZESTORETIC Take 1 tablet by mouth daily.   metFORMIN 500 MG tablet Commonly known as: GLUCOPHAGE Take 500 mg by mouth daily.   multivitamin-iron-minerals-folic acid chewable tablet Chew 2 tablets by mouth daily.   oxyCODONE  5 MG immediate release tablet Commonly known as: Roxicodone  Take 1-2 tablets (5-10 mg total) by  mouth every 4 (four) hours as needed for moderate pain or severe pain.   trimethoprim  100 MG tablet Commonly known as: TRIMPEX  Take 1 tablet (100 mg total) by mouth daily.        Allergies:  Allergies  Allergen Reactions   Pneumococcal 20-Val Conj Vacc Other (See Comments)    Had to be hospitalized   Varicella Virus Vaccine Live Other (See Comments)    Facial Numbness and tingling-occurred 11/13/2021   Covid-19 (Adenovirus) Vaccine Other (See Comments)    Had to be hospitalized.    Family History: Family History  Problem Relation Age of Onset   Cancer Mother    Breast cancer  Mother 67   Cancer Father    Cancer Sister    Breast cancer Sister        40    Social History:  reports that she quit smoking about 16 years ago. Her smoking use included cigarettes. She started smoking about 23 years ago. She has been exposed to tobacco smoke. She has never used smokeless tobacco. She reports that she does not drink alcohol and does not use drugs.  ROS: Pertinent ROS in HPI  Physical Exam: BP 136/83   Pulse 80   Ht 5\' 6"  (1.676 m)   Wt 295 lb (133.8 kg)   BMI 47.61 kg/m   Constitutional:  Well nourished. Alert and oriented, No acute distress. HEENT: [place] AT, moist mucus membranes.  Trachea midline Cardiovascular: No clubbing, cyanosis, or edema. Respiratory: Normal respiratory effort, no increased work of breathing. Neurologic: Grossly intact, no focal deficits, moving all 4 extremities. Psychiatric: Normal mood and affect.  Laboratory Data: Lab Results  Component Value Date   WBC 5.7 09/27/2023   HGB 13.6 09/27/2023   HCT 41.8 09/27/2023   MCV 89.5 09/27/2023   PLT 215 09/27/2023    Lab Results  Component Value Date   CREATININE 0.88 09/27/2023    No results found for: "PSA"  No results found for: "TESTOSTERONE"  No results found for: "HGBA1C"  No results found for: "TSH"  No results found for: "CHOL", "HDL", "CHOLHDL", "VLDL", "LDLCALC"  Lab Results  Component Value Date   AST 31 03/12/2023   Lab Results  Component Value Date   ALT 38 03/12/2023   No components found for: "ALKALINEPHOPHATASE" No components found for: "BILIRUBINTOTAL"  No results found for: "ESTRADIOL"  Urinalysis See EPIC and HPI  I have reviewed the labs.   Pertinent Imaging: Results for orders placed or performed in visit on 11/04/23  BLADDER SCAN AMB NON-IMAGING   Collection Time: 11/04/23  1:58 PM  Result Value Ref Range   Scan Result 23ml    Assessment & Plan:    1.  Suspected UTI - Urinalysis, Complete -w/leukocytosis, hematuria and bacteriuria -  CULTURE, URINE COMPREHENSIVE -pending - BLADDER SCAN AMB NON-IMAGING -demonstrates adequate emptying - Will start Ceftin 500 mg twice daily for 7 days empirically and will adjust once urine culture results are available if necessary  2. GSM - Will start vaginal estrogen cream to be applied Monday, Wednesday and Friday evenings - Explained how studies have shown that consistent use of vaginal estrogen cream has decreased recurrent urinary tract infections in many women - Explained that it will take several months of consistent use to see the differences needed in the vagina to prevent infections, so she needs to be diligent about applying it 3 nights weekly  3.  Microscopic hematuria - Likely secondary to urinary tract infection, but we will  recheck it upon her return with Dr. Clarke Crouch in June to ensure resolution  Return for keep follow up Dr. Clarke Crouch as scheduled .  These notes generated with voice recognition software. I apologize for typographical errors.  Briant Camper  [facility] [address]  [address] [place], [place] [address] [phone]

## 2023-11-06 LAB — CULTURE, URINE COMPREHENSIVE

## 2023-11-20 ENCOUNTER — Ambulatory Visit
Admission: RE | Admit: 2023-11-20 | Discharge: 2023-11-20 | Disposition: A | Discharge status: Home / Self Care | Source: Ambulatory Visit | Attending: Urology | Admitting: Urology

## 2023-11-20 DIAGNOSIS — N3946 Mixed incontinence: Secondary | ICD-10-CM | POA: Insufficient documentation

## 2023-11-20 MED ORDER — IOHEXOL 300 MG/ML  SOLN
100.0000 mL | Freq: Once | INTRAMUSCULAR | Status: AC | PRN
  Administered 2023-11-20: 100 mL via INTRAVENOUS

## 2023-11-28 ENCOUNTER — Telehealth: Payer: Self-pay

## 2023-11-28 NOTE — Telephone Encounter (Signed)
 Pt called the triage line stating she stoppped the gemstesa due to it not helping with leakage and would like to try something else.  Pt has tried oxybutynin  and myrbetriq  in the past. Please advise

## 2023-12-02 ENCOUNTER — Encounter: Attending: Family Medicine | Admitting: Dietician

## 2023-12-02 ENCOUNTER — Encounter: Payer: Self-pay | Admitting: Dietician

## 2023-12-02 DIAGNOSIS — E119 Type 2 diabetes mellitus without complications: Secondary | ICD-10-CM

## 2023-12-02 DIAGNOSIS — E1165 Type 2 diabetes mellitus with hyperglycemia: Secondary | ICD-10-CM | POA: Insufficient documentation

## 2023-12-02 DIAGNOSIS — Z713 Dietary counseling and surveillance: Secondary | ICD-10-CM | POA: Diagnosis not present

## 2023-12-02 NOTE — Progress Notes (Signed)
 Diabetes Self-Management Education  Visit Type: First/Initial  Appt. Start Time: 0815 Appt. End Time: 0920  12/02/2023  [name], identified by name and date of birth, is a 58 y.o. female with a diagnosis of Diabetes: Type 2.   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 Pt reports occasional feelings of lightheadedness and one bout of cold sweats last night, is not checking BG when feeling these symptoms. Pt reports stopping drinking soda and only water  now, eating more baked proteins and vegetables.  Pt reports walking 2-3 times a week for ~30 minutes.   Diabetes Self-Management Education - 12/02/23 0823       Visit Information   Visit Type First/Initial      Initial Visit   Diabetes Type Type 2    Date Diagnosed 11/2023    Are you currently following a meal plan? No    Are you taking your medications as prescribed? Yes      Health Coping   How would you rate your overall health? Fair      Psychosocial Assessment   Patient Belief/Attitude about Diabetes Motivated to manage diabetes    What is the hardest part about your diabetes right now, causing you the most concern, or is the most worrisome to you about your diabetes?   Making healty food and beverage choices;Checking blood sugar    Self-care barriers None    Self-management support Doctor's office    Other persons present Patient    Patient Concerns Nutrition/Meal planning;Glycemic Control;Monitoring;Healthy Lifestyle    Special Needs None    Preferred Learning Style Auditory    Learning Readiness Ready    How  often do you need to have someone help you when you read instructions, pamphlets, or other written materials from your doctor or pharmacy? 1 - Never    What is the last grade level you completed in school? 7th grade      Pre-Education Assessment   Patient understands the diabetes disease and treatment process. Needs Instruction    Patient understands incorporating nutritional management into lifestyle. Needs Instruction    Patient undertands incorporating physical activity into lifestyle. Needs Instruction    Patient understands using medications safely. Needs Instruction    Patient understands monitoring blood glucose, interpreting and using results Needs Instruction    Patient understands prevention, detection, and treatment of acute complications. Needs Instruction    Patient understands prevention, detection, and treatment of chronic complications. Needs Instruction    Patient understands how to develop strategies to address psychosocial issues. Needs Instruction    Patient understands how to develop strategies to promote health/change behavior. Needs Instruction      Complications   Last HgB A1C per patient/outside source 7.6 %   11/11/2023   How often do you check your blood sugar? 1-2 times/day    Fasting Blood glucose range (mg/dL) 16-109    Number of hypoglycemic episodes per month --   Suspect possibkle hypoglycemia, pt had not checked BG when symptomatic   Number of hyperglycemic episodes ( >200mg /dL): Never    Can you tell when your blood  sugar is high? No    Have you had a dilated eye exam in the past 12 months? Yes    Have you had a dental exam in the past 12 months? Yes    Are you checking your feet? No      Dietary Intake   Breakfast 2 eggs, 2 strips of bacon    Lunch None    Dinner Shrimp, onion, bell peppers    Snack (evening) Pork rinds    Beverage(s) Water       Activity / Exercise   Activity / Exercise Type ADL's;Light (walking / raking leaves)    How many days per  week do you exercise? 3    How many minutes per day do you exercise? 30    Total minutes per week of exercise 90      Patient Education   Previous Diabetes Education No    Disease Pathophysiology Factors that contribute to the development of diabetes;Explored patient's options for treatment of their diabetes    Healthy Eating Role of diet in the treatment of diabetes and the relationship between the three main macronutrients and blood glucose level;Plate Method;Food label reading, portion sizes and measuring food.    Being Active Identified with patient nutritional and/or medication changes necessary with exercise.    Medications Reviewed patients medication for diabetes, action, purpose, timing of dose and side effects.;Taught/reviewed insulin/injectables, injection, site rotation, insulin/injectables storage and needle disposal.    Monitoring Taught/evaluated SMBG meter.;Identified appropriate SMBG and/or A1C goals.    Acute complications Taught prevention, symptoms, and  treatment of hypoglycemia - the 15 rule.    Chronic complications Relationship between chronic complications and blood glucose control    Diabetes Stress and Support Identified and addressed patients feelings and concerns about diabetes;Role of stress on diabetes    Lifestyle and Health Coping Lifestyle issues that need to be addressed for better diabetes care      Individualized Goals (developed by patient)   Nutrition Follow meal plan discussed;General guidelines for healthy choices and portions discussed    Physical Activity Exercise 3-5 times per week    Medications take my medication as prescribed    Monitoring  Test my blood glucose as discussed    Problem Solving Eating Pattern    Reducing Risk examine blood glucose patterns;treat hypoglycemia with 15 grams of carbs if blood glucose less than 70mg /dL      Post-Education Assessment   Patient understands the diabetes disease and treatment process. Needs Review     Patient understands incorporating nutritional management into lifestyle. Needs Review    Patient undertands incorporating physical activity into lifestyle. Needs Review    Patient understands using medications safely. Needs Review    Patient understands monitoring blood glucose, interpreting and using results Comprehends key points    Patient understands prevention, detection, and treatment of acute complications. Needs Review    Patient understands prevention, detection, and treatment of chronic complications. Needs Review    Patient understands how to develop strategies to address psychosocial issues. Needs Review    Patient understands how to develop strategies to promote health/change behavior. Needs Review      Outcomes   Expected Outcomes Demonstrated interest in learning. Expect positive outcomes    Future DMSE 3-4 months    Program Status Not Completed             Individualized Plan for Diabetes Self-Management Training:   Learning Objective:  Patient will have a greater understanding of diabetes self-management. Patient education  plan is to attend individual and/or group sessions per assessed needs and concerns.   Plan:   Patient Instructions  Always clean hands with warm, soapy water  or an alcohol pad before checking. Prick on the side of the finger, near the tip beside your finger nail. If blood is slow to come out, massage finger in a downward motion to increase amount of blood.  Touch the strip to the blood until it sucks the blood in fills the sample area.  NEVER TRY TO PUT BLOOD ONTO THE STRIP. Dispose of used lancets in a hard plastic container. Once full, cap the container, duct tape the cap shut, and dispose of it in the regular trash.  Check your blood sugar each morning before eating or drinking (fasting). Look for numbers between 70-130 mg/dL Your goal Z6X is below 7.0%  Work towards eating three meals a day, about 5-6 hours apart!  Begin to recognize  carbohydrates, proteins, and non-starchy vegetables in your food choices!  Begin to build your meals using the proportions of the Balanced Plate. First, select your carb choice(s) for the meal. Make this 25% of your meal. Have a portion no larger than your fist! Next, select your source of protein to pair with your carb choice(s). Make this another 25% of your meal. Have a portion about the size of the palm of your hand! Finally, complete your meal with a variety of non-starchy vegetables. Make this the remaining 50% of your meal.   Expected Outcomes:  Demonstrated interest in learning. Expect positive outcomes  Education material provided: Diabetes Resources  If problems or questions, patient to contact team via:  Phone and Email  Future DSME appointment: 3-4 months

## 2023-12-02 NOTE — Patient Instructions (Addendum)
 Always clean hands with warm, soapy water  or an alcohol pad before checking. Prick on the side of the finger, near the tip beside your finger nail. If blood is slow to come out, massage finger in a downward motion to increase amount of blood.  Touch the strip to the blood until it sucks the blood in fills the sample area.  NEVER TRY TO PUT BLOOD ONTO THE STRIP. Dispose of used lancets in a hard plastic container. Once full, cap the container, duct tape the cap shut, and dispose of it in the regular trash.  Check your blood sugar each morning before eating or drinking (fasting). Look for numbers between 70-130 mg/dL Your goal Z6X is below 7.0%  Work towards eating three meals a day, about 5-6 hours apart!  Begin to recognize carbohydrates, proteins, and non-starchy vegetables in your food choices!  Begin to build your meals using the proportions of the Balanced Plate. First, select your carb choice(s) for the meal. Make this 25% of your meal. Have a portion no larger than your fist! Next, select your source of protein to pair with your carb choice(s). Make this another 25% of your meal. Have a portion about the size of the palm of your hand! Finally, complete your meal with a variety of non-starchy vegetables. Make this the remaining 50% of your meal.

## 2023-12-03 MED ORDER — SOLIFENACIN SUCCINATE 5 MG PO TABS: 5.0000 mg | ORAL_TABLET | Freq: Every day | ORAL | 11 refills | Status: AC

## 2023-12-03 NOTE — Addendum Note (Signed)
 Addended byKatina Parlor on: 12/03/2023 09:30 AM   Modules accepted: Orders

## 2023-12-03 NOTE — Telephone Encounter (Signed)
 Pt informed, meds sent, pt voiced understanding.

## 2023-12-29 ENCOUNTER — Ambulatory Visit (INDEPENDENT_AMBULATORY_CARE_PROVIDER_SITE_OTHER): Admitting: Urology

## 2023-12-29 VITALS — BP 110/56 | HR 76 | Ht 66.0 in | Wt 284.1 lb

## 2023-12-29 DIAGNOSIS — N3941 Urge incontinence: Secondary | ICD-10-CM | POA: Diagnosis not present

## 2023-12-29 DIAGNOSIS — N952 Postmenopausal atrophic vaginitis: Secondary | ICD-10-CM

## 2023-12-29 DIAGNOSIS — R3989 Other symptoms and signs involving the genitourinary system: Secondary | ICD-10-CM

## 2023-12-29 LAB — MICROSCOPIC EXAMINATION: WBC, UA: >30 /HPF — AB (ref 0–5)

## 2023-12-29 LAB — URINALYSIS, COMPLETE
Bilirubin, UA: NEGATIVE
Glucose, UA: NEGATIVE
Ketones, UA: NEGATIVE
Nitrite, UA: NEGATIVE
Protein,UA: NEGATIVE
Specific Gravity, UA: 1.02 (ref 1.005–1.030)
Urobilinogen, Ur: 0.2 mg/dL (ref 0.2–1.0)
pH, UA: 6 (ref 5.0–7.5)

## 2023-12-29 NOTE — Progress Notes (Signed)
 12/29/2023 10:24 AM   Sherry Hernandez [birth year] [id number]  Referring provider: Zachary Idelia LABOR, MD [address] [place],  [place] [address]  Chief Complaint  Patient presents with   Follow-up    HPI: Dr. Cam 2016: mixed incontinence, possible elevated residual, mild cystocele, previous urodynamics.  Failed Myrbetriq  on oxybutynin .  Recommend clean intermittent catheterization   Today I was consulted to assess the patient's urinary incontinence.  She has urge incontinence as a primary complaint.  She also leaks with coughing sneezing bending lifting.  No bedwetting.  She says she soaks 8 or 9 pads a day   Voids at least hourly and cannot hold it for 2 hours.  Gets up at least 5 or 6 times a night and does have ankle edema.  Reports good flow   Some of the details were difficult to ascertain but she thinks she gets at least 4 bladder infections a year with burning and frequency that respond favorably to antibiotics.  She says she recently wiped and saw a little bit of blood.  She has had a hysterectomy   She is on oral hypoglycemics.  She uses a cane because of her swollen left knee and is going to have an MRI   Pelvic examination patient had mild grade 2 hypermobility bladder neck.  No stress incontinence with light cough.  No significant prolapse   Post void residual today was 41 mL     Patient has high-volume mixed incontinence and significant frequency and nocturia.  She may be getting recurrent bladder infections.  She saw a very small amount of blood with wiping in the last few weeks.  Call if culture positive.  Return with hematuria CT scan and cystoscopy.  Called and Vesicare  5 mg 30 x 11 patient understands that we would need to repeat the urodynamics in the future depending on the results.     Patient did not have the CT scan.  Last urine culture was negative Vesicare  helped initially a modest amount but the effect wore off Cystoscopy: patient underwent flexible  cystoscopy.  Urine is very cloudy with a lot of white flecks.  Near the floor the bladder there was a little bit of erythema or congested blood vessels submucosal in keeping with acute cystitis.  As the bladder filled the rest of the urothelium was within normal limits.  No carcinoma.  She quit smoking many years ago.   In retrospect, yesterday patient was having some burning and thought she might be infected and did a urine test with her daughter.  She took some AZO standard   Pelvic examination was little bit limited due to recent left knee surgery at she did great.  She had grade 2 hypermobility the bladder neck and no stress incontinence after cystoscopy with a modest cough I reviewed medical record and no recent positive cultures     Patient  has high-volume mixed incontinence.  She just had knee surgery.  It does appear that the patient gets recurrent urinary tract infections.  I called in ciprofloxacin  250 mg twice a day for 7 days.  I will see her back in 6 weeks on trimethoprim  suppression therapy.  This will also allow her knee to heal.  I will then be ordering urodynamics likely.  I want to see if her mixed symptoms down regulate some on trimethoprim .  Call if culture    Today Frequency stable.  Last culture negative.  The last 2 urinalysis did not show blood in  the urine No blood in urine today.  Burning is gone since she took the antibiotic.  She did not renew the trimethoprim    Still has incontinence and frequency.  Because of fluid shortage I will not order urodynamics yet and she agreed with my plan.  Stay on trimethoprim  and reassess on oxybutynin .  Urodynamics will be ordered in the future   Today Frequency stable.  Clinically not infected but ran out of refills.  Still incontinent and spite of oxybutynin .  Reordered trimethoprim  100 mg 90 x 3.  Call if culture positive.  This would not represent a breakthrough infection.  Order urodynamics and proceed accordingly   Today Once  again patient stopped the daily trimethoprim .  She has had a CT hematuria workup ordered in the past but it was not done but it was ordered.  Urine culture positive in November 2024 but negative in January 2025 last visit   She says she still has frequency and incontinence.\ Today she reports urge incontinence and foot on the floor syndrome and no bedwetting   Patient had urodynamics at [facility].  Maximum bladder capacity was 303 mL.  She had increased bladder sensation.  Compliance was normal.  She had an overactive bladder early on in bladder filling she did not leak.  At 150 mL her Valsalva leak point pressure was 155 cm water .  At 300 mL she leaked a drop at 275 cm of water .  She may have had a leak point pressure as low as 132 cm of water .  Maximum voiding pressure was 18 cm of water .  Flow rate was 13 mL/s.  She voided 114 mL and residual was 190 mL.  EMG activity normal.  She was catheterized at the beginning of the study for 30 mL    Patient has mixed incontinence but primarily an overactive bladder. She leaked a few drops at high leak point pressures. Call if culture positive. Reassess on Gemtesa  samples and prescription and offer third line therapies if she does not reach her treatment goal. She has not been compliant with once a day antibiotics but many of her cultures have been negative. I will leave her off prophylaxis for now. She fell and may have had a crushed vertebrae treated with watchful waiting   Today When I last saw her in April 2025 urine culture was negative.  CT scan was within normal limits.  She had a 9 mm left renal angiomyolipoma.  She then saw a nurse practitioner for possible urinary tract infection.  She had an acute onset of burning that was intense.  She was treated with Keflex for 7 days.  She was started on estrogen cream 3 times a week.  Urine culture from this visit was negative  Patient still has urge incontinence.  She sometimes has bedwetting.  She has  foot on the floor syndrome.  She still has burning.  She uses estrogen cream 2-3 times a week.  She told me in [place] she failed Botox a number years ago at [facility].  Still on Vesicare      PMH: Past Medical History:  Diagnosis Date   Degenerative tear of left medial meniscus    Family history of adverse reaction to anesthesia    a.) PONV in 1st degree relative (mother)   HTN (hypertension)    Neuromuscular disorder (HCC)    Obesity    Overactive bladder    Pre-diabetes    Primary osteoarthritis of left knee    Sensory urge  incontinence    Stress incontinence     Surgical History: Past Surgical History:  Procedure Laterality Date   ABDOMINAL HYSTERECTOMY     COLONOSCOPY WITH PROPOFOL  N/A 03/24/2015   Procedure: COLONOSCOPY WITH PROPOFOL ;  Surgeon: Donnice Vaughn Manes, MD;  Location: [facility] ENDOSCOPY;  Service: Endoscopy;  Laterality: N/A;   TOTAL KNEE ARTHROPLASTY Left 03/18/2023   Procedure: TOTAL KNEE ARTHROPLASTY;  Surgeon: Edie Norleen PARAS, MD;  Location: [facility] ORS;  Service: Orthopedics;  Laterality: Left;    Home Medications:  Allergies as of 12/29/2023       Reactions   Pneumococcal 20-val Conj Vacc Other (See Comments)   Had to be hospitalized   Varicella Virus Vaccine Live Other (See Comments)   Facial Numbness and tingling-occurred 11/13/2021   Covid-19 (adenovirus) Vaccine Other (See Comments)   Had to be hospitalized.        Medication List        Accurate as of December 29, 2023 10:24 AM. If you have any questions, ask your nurse or doctor.          STOP taking these medications    cefUROXime  500 MG tablet Commonly known as: CEFTIN    Gemtesa  75 MG Tabs Generic drug: Vibegron    metFORMIN 500 MG tablet Commonly known as: GLUCOPHAGE   oxyCODONE  5 MG immediate release tablet Commonly known as: Roxicodone        TAKE these medications    Accu-Chek Guide Me w/Device Kit as directed.   Accu-Chek Guide Test test strip Generic drug: glucose  blood daily.   Accu-Chek Softclix Lancets lancets daily.   acetaminophen  500 MG tablet Commonly known as: TYLENOL  Take 1,000 mg by mouth every 6 (six) hours as needed for mild pain or headache.   atorvastatin 20 MG tablet Commonly known as: LIPITOR Take 20 mg by mouth at bedtime.   lisinopril -hydrochlorothiazide 10-12.5 MG tablet Commonly known as: ZESTORETIC Take 1 tablet by mouth daily.   multivitamin-iron-minerals-folic acid chewable tablet Chew 2 tablets by mouth daily.   Semaglutide-Weight Management 0.5 MG/0.5ML Soaj Inject 0.5 mg into the skin. What changed: Another medication with the same name was removed. Continue taking this medication, and follow the directions you see here.   solifenacin  5 MG tablet Commonly known as: VESICARE  Take 1 tablet (5 mg total) by mouth daily.   trimethoprim  100 MG tablet Commonly known as: TRIMPEX  Take 1 tablet (100 mg total) by mouth daily.        Allergies:  Allergies  Allergen Reactions   Pneumococcal 20-Val Conj Vacc Other (See Comments)    Had to be hospitalized   Varicella Virus Vaccine Live Other (See Comments)    Facial Numbness and tingling-occurred 11/13/2021   Covid-19 (Adenovirus) Vaccine Other (See Comments)    Had to be hospitalized.    Family History: Family History  Problem Relation Age of Onset   Cancer Mother    Breast cancer Mother 37   Cancer Father    Cancer Sister    Breast cancer Sister        63    Social History:  reports that she quit smoking about 16 years ago. Her smoking use included cigarettes. She started smoking about 23 years ago. She has been exposed to tobacco smoke. She has never used smokeless tobacco. She reports that she does not drink alcohol and does not use drugs.  ROS:  Physical Exam: BP (!) 110/56   Pulse 76   Ht 5' 6 (1.676 m)   Wt 128.9 kg   BMI 45.86 kg/m   Constitutional:  Alert and oriented, No  acute distress. HEENT: [place] AT, moist mucus membranes.  Trachea midline, no masses.  Laboratory Data: Lab Results  Component Value Date   WBC 5.7 09/27/2023   HGB 13.6 09/27/2023   HCT 41.8 09/27/2023   MCV 89.5 09/27/2023   PLT 215 09/27/2023    Lab Results  Component Value Date   CREATININE 0.88 09/27/2023    No results found for: PSA  No results found for: TESTOSTERONE  No results found for: HGBA1C  Urinalysis    Component Value Date/Time   COLORURINE YELLOW (A) 09/27/2023 2322   APPEARANCEUR Cloudy (A) 11/04/2023 1339   LABSPEC 1.019 09/27/2023 2322   LABSPEC 1.009 04/08/2013 2258   PHURINE 5.0 09/27/2023 2322   GLUCOSEU Negative 11/04/2023 1339   GLUCOSEU Negative 04/08/2013 2258   HGBUR NEGATIVE 09/27/2023 2322   BILIRUBINUR Negative 11/04/2023 1339   BILIRUBINUR Negative 04/08/2013 2258   KETONESUR NEGATIVE 09/27/2023 2322   PROTEINUR Negative 11/04/2023 1339   PROTEINUR NEGATIVE 09/27/2023 2322   NITRITE Negative 11/04/2023 1339   NITRITE NEGATIVE 09/27/2023 2322   LEUKOCYTESUR 2+ (A) 11/04/2023 1339   LEUKOCYTESUR TRACE (A) 09/27/2023 2322   LEUKOCYTESUR Negative 04/08/2013 2258    Pertinent Imaging: Urine reviewed and sent for culture  Assessment & Plan: We talked about burning.  Continue estrogen cream.  Treat positive cultures as needed.  Still has refractory urge incontinence.  Has failed Botox.  I went over percutaneous tibial nerve stimulation and InterStim with site of service in detail.  Handouts given.  She wants to think about them.  Otherwise I believe we have reached the end of the treatment algorithm.  Treat positive cultures as needed  Full templates utilized  1. Suspected UTI (Primary)  - Urinalysis, Complete  2. Vaginal atrophy  - Urinalysis, Complete   No follow-ups on file.  Glendia DELENA Elizabeth, MD  [facility] [address] [place], [place] [address] [phone]

## 2023-12-29 NOTE — Patient Instructions (Signed)
Urgent PC office-based treatment for overactive bladder Take back control of your life! Urgent PC is a non-drug, non-surgical option for overactive bladder and associated symptoms of urinary urgency, urinary frequency and urge incontinence  Urgent-PC- Since 2003, healthcare professionals have used the Urgent PC Neuromodulation System as an effective office treatment for men and women suffering from overactive bladder, a condition commonly referred to as OAB. Urgent PC is up to 80% effective, even after conservative measures and OAB drugs have failed. Plus, Urgent PC is very low risk, making it a great choice for people unable or unwilling to have more invasive procedures.  How does Urgent PC work?  The Urgent PC system delivers a specific type of neuromodulation called percutaneous tibial nerve stimulation (PTNS). During treatment, a small, slim needle electrode is inserted near your ankle. The needle electrode is then connected to the battery-powered stimulator. During your 30-minute treatment, mild impulses from the stimulator travel through the needle electrode, along your leg and to the nerves in your pelvis that control bladder function. This process is also referred to as neuromodulation.  What will I feel with Urgent PC therapy?  Because patients may experience the sensation of the Urgent PC therapy in different ways, it's difficult to say what the treatment would feel like to you. Patients often describe the sensation as "tingling" or "pulsating." Treatment is typically well-tolerated by patients. Urgent PC offers many different levels of stimulation, so your clinician will be able to adjust treatment to suit you as well as address any discomfort that you might experience during treatment.  How often will I need Urgent PC treatments? You will receive an initial series of 12 treatments scheduled about a week apart. If you respond, you will likely need a treatment about once per month to  maintain your improvements.  How soon will I see results with Urgent PC? Because Urgent PC gently modifies the signals to achieve bladder control, it usually takes 5-7 weeks for symptoms to change. However, patients respond at different rates. In a review of about 100 patients who had success with Urgent PC, symptoms improved anywhere between 2-12 weeks. For about 20% of these patients, the symptoms of urgency and/or urge incontinence didn't improve until after 8 weeks.1  There is no way to anticipate who will respond earlier, later or not at all. That's why it is important to receive the 12 recommended treatments before you and your physician evaluate whether this therapy is an appropriate and effective choice for you.  How can I receive treatment with Urgent PC? Urgent PC is an option for patients with OAB. If you think you have OAB, talk to your doctor, a urologist or urogynecologist. If you have OAB, the doctor will work with you to determine your own personal treatment plan which usually starts with behavior and diet modifications plus medications. Urgent PC is an excellent option if these options don't work or provide sufficient improvements. Treatment with Urgent PC is typically performed at the office of a urologist, urogynecologist or gynecologist. So, if you run out of options with your normal doctor, consider visiting one of these specialists.  Does insurance cover Urgent PC? Urgent PC treatment is reimbursed by Medicare across the [place]. Private insurance coverage varies by state. To see if your insurance company covers Urgent PC, use our Coverage Finder or talk to your Healthcare Provider.  Are there patients who should not be treated with Urgent PC? Yes, these include: patients with pacemakers or implantable defibrillators, patients  prone to excessive bleeding, patients with nerve damage that could impact either percutaneous tibial nerve or pelvic floor function and patients who  are pregnant or planning to become pregnant during the duration of the treatment.  What are the risks associated with Urgent PC? The risks associated with Urgent PC therapy are low. Most common side-effects are temporary and include mild pain or skin inflammation at or near the stimulation site.   Med[employer]ronic InterStim Placement  Past treatments haven't given you satisfactory results, which may be due to the way these treatments focus on the muscle rather than the nerves. They don't target the miscommunication between your bladder and your brain. Because bladder function involves both muscles and nerves, you may need something that addresses the communication problem between the bladder and the brain that can cause symptoms.  Medtronic Bladder Control Therapy is unique because it offers a simple evaluation to see if it's right for you. The evaluation is a minimally invasive procedure We will be looking to see if your troublesome bladder symptoms are reduced by at least 50% In as few as 3 days, you'll know if:you're a candidate for long-term treatment we simply need more information   It's important that you document your symptoms before and during your evaluation to help me understand if you see any improvements. I will provide you with a Symptom Tracker and I would like you to document your symptoms for a minimum of three days. Bring your Symptom Tracker back with you at your next appointment.  Tracking daily will help [place] determine if the therapy delivered by the InterStimT system is right for you. You will have 3 appointments scheduled: 1st appointment for a 20-minute, in-office procedure that allows you to try the therapy for a few days. 2nd appointment to remove the lead (thin wire) from your evaluation. 3rd appointment is the date for long-term therapy if you're a candidate or for an advanced evaluation if we need more information.  Here's a brochure explaining the in-office procedure  and long-term therapy, as well as the Symptom Tracker we discussed to document your symptoms. For more information, visit [email or web address].

## 2023-12-30 ENCOUNTER — Telehealth: Admitting: *Deleted

## 2023-12-30 NOTE — Telephone Encounter (Signed)
 Patient called in today and states she would like to start ptns.

## 2023-12-30 NOTE — Telephone Encounter (Signed)
 Left detailed message letting patient know that her insurance-Medicaid, does not cover PTNS

## 2024-03-03 ENCOUNTER — Encounter: Attending: Family Medicine | Admitting: Dietician

## 2024-03-03 ENCOUNTER — Encounter: Payer: Self-pay | Admitting: Dietician

## 2024-03-03 VITALS — Ht 66.0 in | Wt 286.4 lb

## 2024-03-03 DIAGNOSIS — Z713 Dietary counseling and surveillance: Secondary | ICD-10-CM | POA: Insufficient documentation

## 2024-03-03 DIAGNOSIS — Z6841 Body Mass Index (BMI) 40.0 and over, adult: Secondary | ICD-10-CM | POA: Insufficient documentation

## 2024-03-03 DIAGNOSIS — E1165 Type 2 diabetes mellitus with hyperglycemia: Secondary | ICD-10-CM | POA: Diagnosis present

## 2024-03-03 DIAGNOSIS — E119 Type 2 diabetes mellitus without complications: Secondary | ICD-10-CM

## 2024-03-03 NOTE — Patient Instructions (Addendum)
 Contact your PCP regarding your low blood pressure event for potential recommendations regarding your medication. Have one of your electrolyte drinks if this happens again, choose sugar free hydration packet (Liquid IV, Store brand) to add to 1 bottle of water .  Continue to check your blood sugar each morning. Try to keep it under 120 mg/dL now!  Begin to eat regular meals as your schedule changes, be sure to have protein at each meal  Begin to go to [employer] again for resistance exercise! Go on Monday, Wednesday, and Friday around 9:00 - 10:00 am for about an hour. Use light weight for your resistance exercises.  Eat your breakfast at least an hour before the gym, and have a bottle of water  with your breakfast as well.

## 2024-03-03 NOTE — Progress Notes (Signed)
 Diabetes Self-Management Education  Visit Type: Follow-up  Appt. Start Time: 0805 Appt. End Time: 0840  03/03/2024  [name], identified by name and date of birth, is a 58 y.o. female with a diagnosis of Diabetes:  .   ASSESSMENT  Height 5' 6 (1.676 m), weight 286 lb 6.4 oz (129.9 kg). Body mass index is 46.23 kg/m.  Pt reports insurance switched their semaglutide from [facility] to Ozempic, will be switching from 0.25mg  WeGovy to 0.5 mg Ozempic next week, stopped taking Metformin. Pt reports checking FBG daily. Pt reports getting approval for disability, this will be their last week working third shift. Pt reports feeling fully recovered physically from previous fall, interested in exercise. Pt reports trying to eat better, baked foods, salads, fruits, less bread. Pt reports one instance of hypotension (93/52) yesterday, states this has never happened before, felt lightheaded/headache, drank Pedialyte and took Tylenol  for relief. Pt reports drinking ~4 bottles of water  daily.   Diabetes Self-Management Education - 03/03/24 0850       Visit Information   Visit Type Follow-up      Pre-Education Assessment   Patient understands the diabetes disease and treatment process. Needs Review    Patient understands incorporating nutritional management into lifestyle. Needs Review    Patient undertands incorporating physical activity into lifestyle. Needs Review    Patient understands using medications safely. Needs Review    Patient understands monitoring blood glucose, interpreting and using results Comprehends key points    Patient understands prevention, detection, and treatment of acute complications. Needs Review    Patient understands prevention, detection, and treatment of chronic complications. Needs Review    Patient understands how to develop strategies to address psychosocial issues. Needs Review    Patient understands how to develop strategies to promote health/change  behavior. Needs Review      Complications   Last HgB A1C per patient/outside source 6.2 %   03/01/2024   How often do you check your blood sugar? 1-2 times/day    Fasting Blood glucose range (mg/dL) 29-870;869-820   Very rarely >130 mg/dL     Dietary Intake   Breakfast 2 eggs, 3 strips bacon, strawberries    Lunch None    Dinner Grilled chicken salad    Snack (evening) [place] sausages, nabs    Beverage(s) Water , Dr Nunzio DACE      Activity / Exercise   Activity / Exercise Type ADL's    How many days per week do you exercise? 0    How many minutes per day do you exercise? 0    Total minutes per week of exercise 0      Patient Education   Healthy Eating Role of diet in the treatment of diabetes and the relationship between the three main macronutrients and blood glucose level;Meal timing in regards to the patients' current diabetes medication.;Meal options for control of blood glucose level and chronic complications.    Being Active Helped patient identify appropriate exercises in relation to his/her diabetes, diabetes complications and other health issue.    Medications Reviewed patients medication for diabetes, action, purpose, timing of dose and side effects.    Monitoring Identified appropriate SMBG and/or A1C goals.      Individualized Goals (developed by patient)   Nutrition Follow meal plan discussed    Physical Activity Exercise 3-5 times per week    Medications take my medication as prescribed    Monitoring  Test my blood glucose as discussed;Send in my blood glucose log  as discussed    Problem Solving Eating Pattern    Reducing Risk examine blood glucose patterns      Patient Self-Evaluation of Goals - Patient rates self as meeting previously set goals (% of time)   Nutrition 50 - 75 % (half of the time)    Physical Activity < 25% (hardly ever/never)    Medications >75% (most of the time)    Monitoring >75% (most of the time)    Problem Solving and behavior change  strategies  25 - 50% (sometimes)    Reducing Risk (treating acute and chronic complications) 25 - 50% (sometimes)    Health Coping 25 - 50% (sometimes)      Post-Education Assessment   Patient understands the diabetes disease and treatment process. Needs Review    Patient understands incorporating nutritional management into lifestyle. Comprehends key points    Patient undertands incorporating physical activity into lifestyle. Needs Review    Patient understands using medications safely. Comphrehends key points    Patient understands monitoring blood glucose, interpreting and using results Comprehends key points    Patient understands prevention, detection, and treatment of acute complications. Needs Review    Patient understands prevention, detection, and treatment of chronic complications. Needs Review    Patient understands how to develop strategies to address psychosocial issues. Needs Review    Patient understands how to develop strategies to promote health/change behavior. Needs Review      Outcomes   Expected Outcomes Demonstrated interest in learning. Expect positive outcomes    Future DMSE 3-4 months    Program Status Not Completed      Subsequent Visit   Since your last visit have you continued or begun to take your medications as prescribed? Yes    Since your last visit have you had your blood pressure checked? Yes    Is your most recent blood pressure lower, unchanged, or higher since your last visit? Lower    Since your last visit have you experienced any weight changes? Loss    Weight Loss (lbs) 8    Since your last visit, are you checking your blood glucose at least once a day? Yes          Individualized Plan for Diabetes Self-Management Training:   Learning Objective:  Patient will have a greater understanding of diabetes self-management. Patient education plan is to attend individual and/or group sessions per assessed needs and concerns.   Plan:   Patient  Instructions  Contact your PCP regarding your low blood pressure event for potential recommendations regarding your medication. Have one of your electrolyte drinks if this happens again, choose sugar free hydration packet (Liquid IV, Store brand) to add to 1 bottle of water .  Continue to check your blood sugar each morning. Try to keep it under 120 mg/dL now!  Begin to eat regular meals as your schedule changes, be sure to have protein at each meal  Begin to go to [employer] again for resistance exercise! Go on Monday, Wednesday, and Friday around 9:00 - 10:00 am for about an hour. Use light weight for your resistance exercises.  Eat your breakfast at least an hour before the gym, and have a bottle of water  with your breakfast as well.   Expected Outcomes:  Demonstrated interest in learning. Expect positive outcomes  Education material provided: Protein Foods list, Snack Sheet  If problems or questions, patient to contact team via:  Phone and Email  Future DSME appointment: 3-4 months

## 2024-04-20 ENCOUNTER — Telehealth: Payer: Self-pay

## 2024-04-20 NOTE — Telephone Encounter (Signed)
 Pt called in with c/o burning with urination, increased frequency and pain in bladder times a couple days. Pt has a hx of UTIs. I was able to schedule an appointment for tomorrow. Pt voiced understanding.

## 2024-04-21 ENCOUNTER — Ambulatory Visit: Admitting: Physician Assistant

## 2024-04-21 VITALS — BP 148/72 | HR 86 | Ht 66.0 in | Wt 289.0 lb

## 2024-04-21 DIAGNOSIS — N3281 Overactive bladder: Secondary | ICD-10-CM | POA: Diagnosis not present

## 2024-04-21 DIAGNOSIS — R3989 Other symptoms and signs involving the genitourinary system: Secondary | ICD-10-CM | POA: Diagnosis not present

## 2024-04-21 LAB — URINALYSIS, COMPLETE
Bilirubin, UA: NEGATIVE
Glucose, UA: NEGATIVE
Ketones, UA: NEGATIVE
Leukocytes,UA: NEGATIVE
Nitrite, UA: NEGATIVE
Protein,UA: NEGATIVE
Specific Gravity, UA: 1.025 (ref 1.005–1.030)
Urobilinogen, Ur: 0.2 mg/dL (ref 0.2–1.0)
pH, UA: 6 (ref 5.0–7.5)

## 2024-04-21 LAB — BLADDER SCAN AMB NON-IMAGING

## 2024-04-21 LAB — MICROSCOPIC EXAMINATION

## 2024-04-21 NOTE — Progress Notes (Signed)
 04/21/2024 11:17 AM   Sherry Hernandez 10-Jul-1965 [id number]  CC: Chief Complaint  Patient presents with   Recurrent UTI   HPI: Sherry Hernandez is a 58 y.o. female with PMH OAB wet on Vesicare , recurrent UTI, and GSM on estrogen cream who presents today for evaluation of possible UTI.   Today she reports chronic urgency, frequency, urge incontinence, and nocturia x 10.  She thinks it is worsening.  She wears 4-5 pull-ups daily, and they are wet.  She has stopped Vesicare  because she felt it was not effective.  No cardiac pacemaker.  She is s/p hysterectomy.  In-office UA today positive for trace intact blood; urine microscopy pan negative. PVR 2mL.  PMH: Past Medical History:  Diagnosis Date   Degenerative tear of left medial meniscus    Family history of adverse reaction to anesthesia    a.) PONV in 1st degree relative (mother)   HTN (hypertension)    Neuromuscular disorder (HCC)    Obesity    Overactive bladder    Pre-diabetes    Primary osteoarthritis of left knee    Sensory urge incontinence    Stress incontinence     Surgical History: Past Surgical History:  Procedure Laterality Date   ABDOMINAL HYSTERECTOMY     COLONOSCOPY WITH PROPOFOL  N/A 03/24/2015   Procedure: COLONOSCOPY WITH PROPOFOL ;  Surgeon: Donnice Vaughn Manes, MD;  Location: [facility] ENDOSCOPY;  Service: Endoscopy;  Laterality: N/A;   TOTAL KNEE ARTHROPLASTY Left 03/18/2023   Procedure: TOTAL KNEE ARTHROPLASTY;  Surgeon: Edie Norleen PARAS, MD;  Location: [facility] ORS;  Service: Orthopedics;  Laterality: Left;    Home Medications:  Allergies as of 04/21/2024       Reactions   Pneumococcal 20-val Conj Vacc Other (See Comments)   Had to be hospitalized   Varicella Virus Vaccine Live Other (See Comments)   Facial Numbness and tingling-occurred 11/13/2021   Covid-19 (adenovirus) Vaccine Other (See Comments)   Had to be hospitalized.        Medication List        Accurate as of April 21, 2024 11:17 AM.  If you have any questions, ask your nurse or doctor.          STOP taking these medications    trimethoprim  100 MG tablet Commonly known as: TRIMPEX        TAKE these medications    Accu-Chek Guide Me w/Device Kit as directed.   Accu-Chek Guide Test test strip Generic drug: glucose blood daily.   Accu-Chek Softclix Lancets lancets daily.   acetaminophen  500 MG tablet Commonly known as: TYLENOL  Take 1,000 mg by mouth every 6 (six) hours as needed for mild pain or headache.   atorvastatin 20 MG tablet Commonly known as: LIPITOR Take 20 mg by mouth at bedtime.   lisinopril -hydrochlorothiazide 10-12.5 MG tablet Commonly known as: ZESTORETIC Take 1 tablet by mouth daily.   multivitamin-iron-minerals-folic acid chewable tablet Chew 2 tablets by mouth daily.   semaglutide-weight management 0.5 MG/0.5ML Soaj SQ injection Commonly known as: WEGOVY Inject 0.5 mg into the skin.   solifenacin  5 MG tablet Commonly known as: VESICARE  Take 1 tablet (5 mg total) by mouth daily.        Allergies:  Allergies  Allergen Reactions   Pneumococcal 20-Val Conj Vacc Other (See Comments)    Had to be hospitalized   Varicella Virus Vaccine Live Other (See Comments)    Facial Numbness and tingling-occurred 11/13/2021   Covid-19 (Adenovirus) Vaccine Other (See Comments)  Had to be hospitalized.    Family History: Family History  Problem Relation Age of Onset   Cancer Mother    Breast cancer Mother 75   Cancer Father    Cancer Sister    Breast cancer Sister        71    Social History:   reports that she quit smoking about 16 years ago. Her smoking use included cigarettes. She started smoking about 23 years ago. She has been exposed to tobacco smoke. She has never used smokeless tobacco. She reports that she does not drink alcohol and does not use drugs.  Physical Exam: BP (!) 148/72   Pulse 86   Ht 5' 6 (1.676 m)   Wt 289 lb (131.1 kg)   BMI 46.65 kg/m    Constitutional:  Alert and oriented, no acute distress, nontoxic appearing HEENT: [place], AT Cardiovascular: No clubbing, cyanosis, or edema Respiratory: Normal respiratory effort, no increased work of breathing Skin: No rashes, bruises or suspicious lesions Neurologic: Grossly intact, no focal deficits, moving all 4 extremities Psychiatric: Normal mood and affect  Laboratory Data: Results for orders placed or performed in visit on 04/21/24  BLADDER SCAN AMB NON-IMAGING   Collection Time: 04/21/24 11:26 AM  Result Value Ref Range   Scan Result 2ml    Assessment & Plan:   1. OAB (overactive bladder) (Primary) Chronic, possibly progressive.  She has failed multiple medications and intravesical Botox.  We discussed PTNS versus InterStim and she would like to try PTNS, which is reasonable.  UA bland and she is emptying appropriately today. - Urinalysis, Complete - CULTURE, URINE COMPREHENSIVE - BLADDER SCAN AMB NON-IMAGING   Return for Will call to schedule PTNS.  Lucie Hones, PA-C  [facility] [place] [address], [address] [place], [place] [address] [phone]

## 2024-04-27 ENCOUNTER — Ambulatory Visit: Payer: Self-pay | Admitting: Physician Assistant

## 2024-04-27 LAB — CULTURE, URINE COMPREHENSIVE

## 2024-05-04 ENCOUNTER — Telehealth: Payer: Self-pay

## 2024-05-04 NOTE — Telephone Encounter (Signed)
 Called patient to get her scheduled for PTNS no answer left message to call back

## 2024-05-11 ENCOUNTER — Ambulatory Visit (INDEPENDENT_AMBULATORY_CARE_PROVIDER_SITE_OTHER): Admitting: Urology

## 2024-05-11 DIAGNOSIS — N3946 Mixed incontinence: Secondary | ICD-10-CM | POA: Diagnosis not present

## 2024-05-11 DIAGNOSIS — N3281 Overactive bladder: Secondary | ICD-10-CM

## 2024-05-11 DIAGNOSIS — N3941 Urge incontinence: Secondary | ICD-10-CM

## 2024-05-11 NOTE — Patient Instructions (Signed)

## 2024-05-11 NOTE — Progress Notes (Unsigned)
 PTNS  Session # 1  Health & Social Factors: none Caffeine : 3-4 Alcohol: 0 Daytime voids #per day: 30-40 Night-time voids #per night: 5-6 Urgency: severe Incontinence Episodes #per day: 4-5 Ankle used: Right Treatment Setting: 7 Feeling/ Response: sensory Comments: n/a  Performed By: Mathew Pinal, RN  Follow Up: 1 week

## 2024-05-18 ENCOUNTER — Ambulatory Visit (INDEPENDENT_AMBULATORY_CARE_PROVIDER_SITE_OTHER): Admitting: Physician Assistant

## 2024-05-18 DIAGNOSIS — N3281 Overactive bladder: Secondary | ICD-10-CM | POA: Diagnosis not present

## 2024-05-18 NOTE — Patient Instructions (Signed)

## 2024-05-18 NOTE — Progress Notes (Signed)
 PTNS  Session # 2  Health & Social Factors: no change Caffeine : 1 this week Alcohol: 0 Daytime voids #per day: 9-10 Night-time voids #per night: 6-7 Urgency: severe Incontinence Episodes #per day: 2 Ankle used: left Treatment Setting: 6 Feeling/ Response: sensory Comments: n/a  Performed By: Mathew Pinal, RN  Follow Up: 1 week

## 2024-05-25 ENCOUNTER — Ambulatory Visit (INDEPENDENT_AMBULATORY_CARE_PROVIDER_SITE_OTHER): Admitting: Physician Assistant

## 2024-05-25 DIAGNOSIS — N3941 Urge incontinence: Secondary | ICD-10-CM

## 2024-05-25 DIAGNOSIS — N3946 Mixed incontinence: Secondary | ICD-10-CM

## 2024-05-25 DIAGNOSIS — N3281 Overactive bladder: Secondary | ICD-10-CM

## 2024-05-25 NOTE — Progress Notes (Signed)
 PTNS  Session # 3  Health & Social Factors: no change Caffeine : 0 Alcohol: 0 Daytime voids #per day: 7 Night-time voids #per night: 3-4 Urgency: strong Incontinence Episodes #per day: 1 in a week Ankle used: right Treatment Setting: 9 Feeling/ Response: sensory Comments: n/a  Performed By: Mathew Pinal, RN  Follow Up: 1 week

## 2024-05-25 NOTE — Patient Instructions (Signed)

## 2024-05-26 ENCOUNTER — Other Ambulatory Visit: Payer: Self-pay | Admitting: Family Medicine

## 2024-05-26 DIAGNOSIS — Z1231 Encounter for screening mammogram for malignant neoplasm of breast: Secondary | ICD-10-CM

## 2024-06-01 ENCOUNTER — Ambulatory Visit (INDEPENDENT_AMBULATORY_CARE_PROVIDER_SITE_OTHER): Admitting: Physician Assistant

## 2024-06-01 DIAGNOSIS — N3281 Overactive bladder: Secondary | ICD-10-CM | POA: Diagnosis not present

## 2024-06-01 NOTE — Patient Instructions (Signed)

## 2024-06-01 NOTE — Progress Notes (Signed)
 PTNS  Session # 4  Health & Social Factors: some change in voids per day Caffeine : 0 Alcohol: 0 Daytime voids #per day: 4 Night-time voids #per night: 2-3 Urgency: strong Incontinence Episodes #per day: 1 per week Ankle used: right Treatment Setting: 8 Feeling/ Response: sensory Comments: n/a  Performed By: Mathew Pinal, RN  Follow Up: one week

## 2024-06-08 ENCOUNTER — Ambulatory Visit (INDEPENDENT_AMBULATORY_CARE_PROVIDER_SITE_OTHER): Admitting: Physician Assistant

## 2024-06-08 DIAGNOSIS — N3281 Overactive bladder: Secondary | ICD-10-CM | POA: Diagnosis not present

## 2024-06-08 NOTE — Patient Instructions (Signed)

## 2024-06-08 NOTE — Progress Notes (Signed)
 PTNS  Session # 5  Health & Social Factors: no change Caffeine : 0 Alcohol: 0 Daytime voids #per day: 6-7 Night-time voids #per night: 4 Urgency: strong Incontinence Episodes #per day: 1 per week Ankle used: left Treatment Setting: 9 Feeling/ Response: sensory Comments: n/a  Performed By: Mathew Pinal, RN  Follow Up: 1 week

## 2024-06-14 ENCOUNTER — Encounter: Payer: Self-pay | Admitting: Physician Assistant

## 2024-06-15 ENCOUNTER — Ambulatory Visit

## 2024-06-15 DIAGNOSIS — N3281 Overactive bladder: Secondary | ICD-10-CM

## 2024-06-15 NOTE — Patient Instructions (Signed)

## 2024-06-15 NOTE — Progress Notes (Signed)
 PTNS  Session # 6  Health & Social Factors: no change Caffeine : 0 Alcohol: 0 Daytime voids #per day: 6 Night-time voids #per night: 3 Urgency: mild Incontinence Episodes #per day: 0 Ankle used: right Treatment Setting: 9 Feeling/ Response: sensory Comments: n/a  Performed By: Mathew pinal, RN  Follow Up: 1 week

## 2024-06-22 ENCOUNTER — Ambulatory Visit

## 2024-06-24 ENCOUNTER — Ambulatory Visit

## 2024-06-24 DIAGNOSIS — N3281 Overactive bladder: Secondary | ICD-10-CM | POA: Diagnosis not present

## 2024-06-24 DIAGNOSIS — N3946 Mixed incontinence: Secondary | ICD-10-CM

## 2024-06-24 DIAGNOSIS — N3941 Urge incontinence: Secondary | ICD-10-CM

## 2024-06-24 NOTE — Patient Instructions (Signed)

## 2024-06-24 NOTE — Progress Notes (Unsigned)
 PTNS  Session # 7  Health & Social Factors: having a bit more urination during the day Caffeine : 2-3 a week Alcohol: 0 Daytime voids #per day: 9 Night-time voids #per night: 2-3 Urgency: strong Incontinence Episodes #per day: 0 Ankle used: left Treatment Setting: 10 Feeling/ Response: sensory and toe flex Comments: n/a  Performed By: Mathew Pinal, RN  Follow Up: 1 week

## 2024-06-25 ENCOUNTER — Ambulatory Visit: Admitting: Dietician

## 2024-06-29 ENCOUNTER — Ambulatory Visit (INDEPENDENT_AMBULATORY_CARE_PROVIDER_SITE_OTHER): Admitting: Urology

## 2024-06-29 DIAGNOSIS — N3281 Overactive bladder: Secondary | ICD-10-CM | POA: Diagnosis not present

## 2024-06-29 NOTE — Progress Notes (Signed)
 PTNS  Session # 8  Health & Social Factors: some change Caffeine : 0 Alcohol: 0 Daytime voids #per day: 5 Night-time voids #per night: 2-3 Urgency: mild Incontinence Episodes #per day: 0 Ankle used: right Treatment Setting: 17 Feeling/ Response: sensory and toe flex Comments: n/a  Performed By: Mathew Pinal, RN  Follow Up: 1 week

## 2024-06-29 NOTE — Patient Instructions (Signed)

## 2024-07-02 ENCOUNTER — Ambulatory Visit
Admission: RE | Admit: 2024-07-02 | Discharge: 2024-07-02 | Disposition: A | Discharge status: Home / Self Care | Source: Ambulatory Visit | Attending: Family Medicine | Admitting: Family Medicine

## 2024-07-02 DIAGNOSIS — Z1231 Encounter for screening mammogram for malignant neoplasm of breast: Secondary | ICD-10-CM | POA: Insufficient documentation

## 2024-07-06 ENCOUNTER — Ambulatory Visit (INDEPENDENT_AMBULATORY_CARE_PROVIDER_SITE_OTHER): Admitting: Physician Assistant

## 2024-07-06 DIAGNOSIS — N3281 Overactive bladder: Secondary | ICD-10-CM | POA: Diagnosis not present

## 2024-07-06 DIAGNOSIS — N3941 Urge incontinence: Secondary | ICD-10-CM

## 2024-07-06 DIAGNOSIS — N3946 Mixed incontinence: Secondary | ICD-10-CM

## 2024-07-06 NOTE — Patient Instructions (Signed)

## 2024-07-06 NOTE — Progress Notes (Addendum)
 PTNS  Session # 9  Health & Social Factors: no change Caffeine : 0 Alcohol: 0 Daytime voids #per day: 5-7 Night-time voids #per night: 4-5 Urgency: strong Incontinence Episodes #per day: 2-3 per week Ankle used: left Treatment Setting: 15 Feeling/ Response: sensory Comments: n/a  Performed By: Mathew Pinal, RN  Follow Up: 1 week

## 2024-07-12 ENCOUNTER — Other Ambulatory Visit: Payer: Self-pay | Admitting: Family Medicine

## 2024-07-12 ENCOUNTER — Ambulatory Visit
Admission: RE | Admit: 2024-07-12 | Discharge: 2024-07-12 | Disposition: A | Discharge status: Home / Self Care | Source: Ambulatory Visit | Attending: Family Medicine | Admitting: Family Medicine

## 2024-07-12 ENCOUNTER — Ambulatory Visit (INDEPENDENT_AMBULATORY_CARE_PROVIDER_SITE_OTHER): Admitting: Physician Assistant

## 2024-07-12 DIAGNOSIS — R1032 Left lower quadrant pain: Secondary | ICD-10-CM | POA: Diagnosis present

## 2024-07-12 DIAGNOSIS — N3281 Overactive bladder: Secondary | ICD-10-CM | POA: Diagnosis not present

## 2024-07-12 MED ORDER — IOHEXOL 300 MG/ML  SOLN
100.0000 mL | Freq: Once | INTRAMUSCULAR | Status: AC | PRN
  Administered 2024-07-12: 100 mL via INTRAVENOUS

## 2024-07-12 NOTE — Patient Instructions (Signed)

## 2024-07-12 NOTE — Progress Notes (Signed)
 PTNS  Session # 10  Health & Social Factors: some change seen Caffeine : 0 Alcohol: 0 Daytime voids #per day: 4 Night-time voids #per night: 2 Urgency: mild Incontinence Episodes #per day: 0 Ankle used: right Treatment Setting: 13 Feeling/ Response: sensory Comments: n/a  Performed By: Mathew Pinal, RN  Follow Up: 1 week

## 2024-07-13 ENCOUNTER — Ambulatory Visit

## 2024-07-20 ENCOUNTER — Ambulatory Visit: Admitting: Physician Assistant

## 2024-07-20 DIAGNOSIS — N3946 Mixed incontinence: Secondary | ICD-10-CM

## 2024-07-20 DIAGNOSIS — N3281 Overactive bladder: Secondary | ICD-10-CM

## 2024-07-20 DIAGNOSIS — N3941 Urge incontinence: Secondary | ICD-10-CM

## 2024-07-20 NOTE — Patient Instructions (Signed)

## 2024-07-20 NOTE — Progress Notes (Signed)
 PTNS  Session # 11  Health & Social Factors: some worsening symptoms likely due to increase of fluids but patient swelling has decreased and patient has lost 10 lbs Caffeine : 0 Alcohol: 0 Daytime voids #per day: 6-7 Night-time voids #per night: 6 Urgency: strong Incontinence Episodes #per day: 2 in a week Ankle used: right Treatment Setting: 12 Feeling/ Response: sensory Comments: n/a  Performed By: Mathew Pinal, RN  Follow Up: 1 week

## 2024-07-27 ENCOUNTER — Ambulatory Visit: Admitting: Physician Assistant

## 2024-07-27 DIAGNOSIS — N3941 Urge incontinence: Secondary | ICD-10-CM

## 2024-07-27 DIAGNOSIS — N3946 Mixed incontinence: Secondary | ICD-10-CM

## 2024-07-27 DIAGNOSIS — N3281 Overactive bladder: Secondary | ICD-10-CM

## 2024-07-27 NOTE — Progress Notes (Signed)
 PTNS  Session # 12  Health & Social Factors: feels lots of change Caffeine : 0 Alcohol: 0 Daytime voids #per day: 3 Night-time voids #per night: 2 Urgency: mild Incontinence Episodes #per day: 0 Ankle used: left Treatment Setting: 8 Feeling/ Response: sensory Comments: n/a  Performed By: Mathew Pinal, RN  Follow Up: 3 weeks with prescribing provider for review of treatments

## 2024-08-17 ENCOUNTER — Ambulatory Visit: Admitting: Physician Assistant
